# Patient Record
Sex: Female | Born: 1955 | Race: White | Hispanic: No | Marital: Single | Smoking: Never smoker
Health system: Southern US, Community
[De-identification: ages and names within clinical notes are randomized; demographics above are authoritative.]

## PROBLEM LIST (undated history)

## (undated) DIAGNOSIS — Z9889 Other specified postprocedural states: Secondary | ICD-10-CM

## (undated) DIAGNOSIS — D649 Anemia, unspecified: Secondary | ICD-10-CM

## (undated) DIAGNOSIS — E785 Hyperlipidemia, unspecified: Secondary | ICD-10-CM

## (undated) DIAGNOSIS — J189 Pneumonia, unspecified organism: Secondary | ICD-10-CM

## (undated) DIAGNOSIS — H269 Unspecified cataract: Secondary | ICD-10-CM

## (undated) DIAGNOSIS — I341 Nonrheumatic mitral (valve) prolapse: Secondary | ICD-10-CM

## (undated) DIAGNOSIS — I2510 Atherosclerotic heart disease of native coronary artery without angina pectoris: Secondary | ICD-10-CM

## (undated) DIAGNOSIS — R112 Nausea with vomiting, unspecified: Secondary | ICD-10-CM

## (undated) DIAGNOSIS — I10 Essential (primary) hypertension: Secondary | ICD-10-CM

## (undated) DIAGNOSIS — T7840XA Allergy, unspecified, initial encounter: Secondary | ICD-10-CM

## (undated) DIAGNOSIS — M797 Fibromyalgia: Secondary | ICD-10-CM

## (undated) DIAGNOSIS — C50919 Malignant neoplasm of unspecified site of unspecified female breast: Secondary | ICD-10-CM

## (undated) DIAGNOSIS — Z8489 Family history of other specified conditions: Secondary | ICD-10-CM

## (undated) HISTORY — PX: OTHER SURGICAL HISTORY: SHX169

## (undated) HISTORY — DX: Allergy, unspecified, initial encounter: T78.40XA

## (undated) HISTORY — DX: Unspecified cataract: H26.9

## (undated) HISTORY — PX: JOINT REPLACEMENT: SHX530

## (undated) HISTORY — PX: FOOT BONE EXCISION: SUR493

## (undated) HISTORY — PX: SPINE SURGERY: SHX786

## (undated) HISTORY — PX: BREAST SURGERY: SHX581

## (undated) HISTORY — PX: UPPER GI ENDOSCOPY: SHX6162

---
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
## 2000-08-21 HISTORY — PX: ABDOMINAL HYSTERECTOMY: SHX81

## 2007-08-22 DIAGNOSIS — C50919 Malignant neoplasm of unspecified site of unspecified female breast: Secondary | ICD-10-CM

## 2007-08-22 HISTORY — DX: Malignant neoplasm of unspecified site of unspecified female breast: C50.919

## 2010-08-21 HISTORY — PX: TONSILLECTOMY: SUR1361

## 2011-08-22 HISTORY — PX: SHOULDER ARTHROTOMY: SUR111

## 2015-08-24 DIAGNOSIS — Z96612 Presence of left artificial shoulder joint: Secondary | ICD-10-CM | POA: Insufficient documentation

## 2018-01-08 ENCOUNTER — Encounter: Payer: Self-pay | Admitting: Family Medicine

## 2018-02-09 ENCOUNTER — Encounter: Payer: Self-pay | Admitting: Family Medicine

## 2018-03-11 LAB — HM DEXA SCAN

## 2018-05-30 ENCOUNTER — Encounter: Payer: Self-pay | Admitting: Family Medicine

## 2019-07-07 LAB — BASIC METABOLIC PANEL
BUN: 29 — AB (ref 4–21)
CO2: 30 — AB (ref 13–22)
Creatinine: 0.7 (ref 0.5–1.1)
Glucose: 89
Potassium: 4.7 (ref 3.4–5.3)

## 2019-07-07 LAB — CBC AND DIFFERENTIAL
HCT: 34 — AB (ref 36–46)
Hemoglobin: 11.5 — AB (ref 12.0–16.0)
Platelets: 256 (ref 150–399)
WBC: 4.3

## 2019-07-07 LAB — LIPID PANEL
Cholesterol: 208 — AB (ref 0–200)
HDL: 61 (ref 35–70)
LDL Cholesterol: 117
Triglycerides: 150 (ref 40–160)

## 2019-07-07 LAB — CBC: RBC: 3.81 — AB (ref 3.87–5.11)

## 2019-07-07 LAB — COMPREHENSIVE METABOLIC PANEL: Albumin: 4.2 (ref 3.5–5.0)

## 2019-07-07 LAB — HEPATIC FUNCTION PANEL
ALT: 22 (ref 7–35)
AST: 22 (ref 13–35)

## 2019-08-22 HISTORY — PX: FOOT BONE EXCISION: SUR493

## 2019-10-02 ENCOUNTER — Encounter (HOSPITAL_COMMUNITY): Payer: Self-pay

## 2019-10-02 ENCOUNTER — Other Ambulatory Visit: Payer: Self-pay

## 2019-10-02 ENCOUNTER — Ambulatory Visit (HOSPITAL_COMMUNITY)
Admission: EM | Admit: 2019-10-02 | Discharge: 2019-10-02 | Disposition: A | Discharge status: Home / Self Care | Payer: Medicare Other | Attending: Family Medicine | Admitting: Family Medicine

## 2019-10-02 DIAGNOSIS — K219 Gastro-esophageal reflux disease without esophagitis: Secondary | ICD-10-CM

## 2019-10-02 HISTORY — DX: Hyperlipidemia, unspecified: E78.5

## 2019-10-02 MED ORDER — LIDOCAINE VISCOUS HCL 2 % MT SOLN
15.0000 mL | Freq: Once | OROMUCOSAL | Status: AC
  Administered 2019-10-02: 15 mL via ORAL

## 2019-10-02 MED ORDER — LIDOCAINE VISCOUS HCL 2 % MT SOLN
OROMUCOSAL | Status: AC
  Filled 2019-10-02: qty 15

## 2019-10-02 MED ORDER — BENZONATATE 100 MG PO CAPS: 100.0000 mg | ORAL_CAPSULE | Freq: Three times a day (TID) | ORAL | 0 refills | Status: DC

## 2019-10-02 MED ORDER — ALUM & MAG HYDROXIDE-SIMETH 200-200-20 MG/5ML PO SUSP
30.0000 mL | Freq: Once | ORAL | Status: AC
  Administered 2019-10-02: 10:00:00 30 mL via ORAL

## 2019-10-02 MED ORDER — ALUM & MAG HYDROXIDE-SIMETH 200-200-20 MG/5ML PO SUSP
ORAL | Status: AC
  Filled 2019-10-02: qty 30

## 2019-10-02 MED ORDER — OMEPRAZOLE 20 MG PO CPDR: 20.0000 mg | DELAYED_RELEASE_CAPSULE | Freq: Two times a day (BID) | ORAL | 1 refills | Status: DC

## 2019-10-02 MED ORDER — ALUMINUM & MAGNESIUM HYDROXIDE 200-200 MG/5ML PO SUSP: 10.0000 mL | Freq: Three times a day (TID) | ORAL | 0 refills | Status: DC

## 2019-10-02 NOTE — Discharge Instructions (Addendum)
Take the Prilosec 2 times a day before meals.  Preferably in the morning and evening.  You can use the Maalox as needed up to 3 times a day for acute flares of the acid reflux. Try to reduce the spicy, greasy, processed foods because this could flareup symptoms.  Chocolate and caffeine could also cause symptoms. Tessalon Perles for cough. Contact put on your discharge directions for primary care if needed

## 2019-10-02 NOTE — ED Triage Notes (Addendum)
Pt c/o heartburn, indigestion, belching intermittently for several months. She took OTC prilosec for several weeks with improvement and sx returned after stopping OTC. States had increase belching and GI symptoms after eating tacos two nights ago. Restarted prilosec OTC two weeks, but sx persist. Also c/o dysphagia x3 months, "feels like food doesn't want to go down". Had vomiting two nights ago.  Denies CP, SOB, diaphoresis, dizziness. Request refill of Tessalon pearls

## 2019-10-05 NOTE — ED Provider Notes (Addendum)
[facility]    CSN: [id number] Arrival date & time: 10/02/19  [id number]      History   Chief Complaint Chief Complaint  Patient presents with  . Heartburn    HPI Shannon Osborne is Osborne 64 y.o. female.   Patient is Osborne 64 year old female that presents today with heartburn, indigestion and belching that has been intermittent, waxing waning over the past month or so.  She has been taking over-the-counter Prilosec 20 mg daily for the last several weeks with some improvement but symptoms returned after stopping the medication.  She ate 2 tacos last night and had increased symptoms so she restarted the Prilosec.  Feels the sensation of something stuck in throat and esophagus.  Vomited after eating 2 days ago.  Reporting recent increase in stress with selling house and moving into [facility] new house.  Reports her diet has not been as healthy as usual due to not been able to cook food at home.  Denies any chest pain, shortness of breath, diaphoresis, dizziness.  Has also had intermittent cough. No abdominal pain.   ROS per HPI      Past Medical History:  Diagnosis Date  . Breast CA ([place])   . Hyperlipemia     There are no problems to display for this patient.   Past Surgical History:  Procedure Laterality Date  . ABDOMINAL HYSTERECTOMY    . BREAST SURGERY    . FOOT BONE EXCISION    . SHOULDER ARTHROTOMY    . TONSILLECTOMY      OB History   No obstetric history on file.      Home Medications    Prior to Admission medications   Medication Sig Start Date End Date Taking? Authorizing Provider  carisoprodol (SOMA) 350 MG tablet Take 350 mg by mouth 3 (three) times daily.   Yes [provider]  diclofenac (VOLTAREN) 75 MG EC tablet Take 75 mg by mouth 2 (two) times daily.   Yes [provider]  ezetimibe (ZETIA) 10 MG tablet Take 10 mg by mouth daily.   Yes [provider]  Fluticasone Propionate 0.05 % LOTN Apply topically.   Yes [provider]   Melatonin 10 MG CAPS Take by mouth.   Yes [provider]  Pitavastatin Calcium (LIVALO) 4 MG TABS Take by mouth.   Yes [provider]  aluminum-magnesium hydroxide 200-200 MG/5ML suspension Take 10 mLs by mouth 3 (three) times daily. 10/02/19   Shannon Halt A, NP  benzonatate (TESSALON) 100 MG capsule Take 1 capsule (100 mg total) by mouth every 8 (eight) hours. Take 1-2 capsules every 8 hours as needed 10/02/19   Shannon Halt A, NP  omeprazole (PRILOSEC) 20 MG capsule Take 1 capsule (20 mg total) by mouth 2 (two) times daily before [place] meal. 10/02/19 11/01/19  Orvan July, NP    Family History Family History  Problem Relation Age of Onset  . Hypertension Mother   . Stroke Father     Social History Social History   Tobacco Use  . Smoking status: Never Smoker  . Smokeless tobacco: Never Used  Substance Use Topics  . Alcohol use: Yes  . Drug use: Never     Allergies   Atorvastatin, Baclofen, Cyclobenzaprine, Escitalopram oxalate, Prednisone, Rosuvastatin, Sertraline, Simvastatin, Codeine, Contrast media  [iodinated diagnostic agents], Hydrocodone-acetaminophen, and Oxycodone hcl   Review of Systems Review of Systems   Physical Exam Triage Vital Signs ED Triage Vitals  Enc Vitals Group  BP 10/02/19 0849 (!) 151/74     Pulse Rate 10/02/19 0849 79     Resp 10/02/19 0849 20     Temp 10/02/19 0849 98.2 F (36.8 C)     Temp Source 10/02/19 0849 Oral     SpO2 10/02/19 0849 100 %     Weight --      Height --      Head Circumference --      Peak Flow --      Pain Score 10/02/19 0839 2     Pain Loc --      Pain Edu? --      Excl. in [place]? --    No data found.  Updated Vital Signs BP (!) 151/74 (BP Location: Left Arm)   Pulse 79   Temp 98.2 F (36.8 C) (Oral)   Resp 20   SpO2 100%   Visual Acuity Right Eye Distance:   Left Eye Distance:   Bilateral Distance:    Right Eye Near:   Left Eye Near:    Bilateral Near:     Physical Exam Vitals and  nursing note reviewed.  Constitutional:      General: She is not in acute distress.    Appearance: She is well-developed.  HENT:     Head: Normocephalic and atraumatic.  Eyes:     Conjunctiva/sclera: Conjunctivae normal.  Cardiovascular:     Rate and Rhythm: Normal rate and regular rhythm.     Heart sounds: No murmur.  Pulmonary:     Effort: Pulmonary effort is normal. No respiratory distress.     Breath sounds: Normal breath sounds.  Abdominal:     Palpations: Abdomen is soft.     Tenderness: There is no abdominal tenderness.  Musculoskeletal:     Cervical back: Neck supple.  Skin:    General: Skin is warm and dry.  Neurological:     Mental Status: She is alert.      UC Treatments / Results  Labs (all labs ordered are listed, but only abnormal results are displayed) Labs Reviewed - No data to display  EKG   Radiology No results found.  Procedures Procedures (including critical care time)  Medications Ordered in UC Medications  alum & mag hydroxide-simeth (MAALOX/MYLANTA) 200-200-20 MG/5ML suspension 30 mL (30 mLs Oral Given 10/02/19 0930)    And  lidocaine (XYLOCAINE) 2 % viscous mouth solution 15 mL (15 mLs Oral Given 10/02/19 0930)    Initial Impression / Assessment and Plan / UC Course  I have reviewed the triage vital signs and the nursing notes.  Pertinent labs & imaging results that were available during my care of the patient were reviewed by me and considered in my medical decision making (see chart for details).     GERD-most likely diagnosis based on symptoms and history. We will have her increase the Prilosec to 2 times [place] day before meals Maalox as needed GI cocktail given here with some relief. Diet instructions and precautions given Refill Tessalon Perles for cough as requested. EKG with normal sinus rhythm and possible left atrial enlargement.  Nothing acute and no concerns for ACS at this time. Vital signs stable and she is nontoxic or  ill-appearing. Contact given for primary care follow-up Final Clinical Impressions(s) / UC Diagnoses   Final diagnoses:  Gastroesophageal reflux disease without esophagitis     Discharge Instructions     Take the Prilosec 2 times [place] day before meals.  Preferably in the morning and evening.  You can use the Maalox as needed up to 3 times [place] day for acute flares of the acid reflux. Try to reduce the spicy, greasy, processed foods because this could flareup symptoms.  Chocolate and caffeine could also cause symptoms. Tessalon Perles for cough. Contact put on your discharge directions for primary care if needed    ED Prescriptions    Medication Sig Dispense Auth. Provider   omeprazole (PRILOSEC) 20 MG capsule Take 1 capsule (20 mg total) by mouth 2 (two) times daily before [place] meal. 30 capsule Shannon Happ A, NP   aluminum-magnesium hydroxide 200-200 MG/5ML suspension Take 10 mLs by mouth 3 (three) times daily. 355 mL Shannon Hannen A, NP   benzonatate (TESSALON) 100 MG capsule Take 1 capsule (100 mg total) by mouth every 8 (eight) hours. Take 1-2 capsules every 8 hours as needed 21 capsule Shannon Cisek A, NP     PDMP not reviewed this encounter.   Orvan July, NP 10/05/19 1130    Shannon Halt A, NP 10/05/19 1131

## 2019-10-22 LAB — HM COLONOSCOPY

## 2019-11-03 ENCOUNTER — Other Ambulatory Visit: Payer: Self-pay | Admitting: Student

## 2019-11-03 DIAGNOSIS — M79671 Pain in right foot: Secondary | ICD-10-CM

## 2019-11-10 ENCOUNTER — Ambulatory Visit
Admission: RE | Admit: 2019-11-10 | Discharge: 2019-11-10 | Disposition: A | Discharge status: Home / Self Care | Payer: Medicare Other | Source: Ambulatory Visit | Attending: Student | Admitting: Student

## 2019-11-10 ENCOUNTER — Other Ambulatory Visit: Payer: Self-pay

## 2019-11-10 DIAGNOSIS — M79671 Pain in right foot: Secondary | ICD-10-CM

## 2020-01-16 DIAGNOSIS — M19171 Post-traumatic osteoarthritis, right ankle and foot: Secondary | ICD-10-CM | POA: Insufficient documentation

## 2020-01-16 DIAGNOSIS — M960 Pseudarthrosis after fusion or arthrodesis: Secondary | ICD-10-CM | POA: Insufficient documentation

## 2020-02-18 ENCOUNTER — Ambulatory Visit (INDEPENDENT_AMBULATORY_CARE_PROVIDER_SITE_OTHER): Payer: Medicare Other | Admitting: Family Medicine

## 2020-02-18 ENCOUNTER — Encounter: Payer: Self-pay | Admitting: Family Medicine

## 2020-02-18 ENCOUNTER — Other Ambulatory Visit: Payer: Self-pay

## 2020-02-18 VITALS — BP 132/74 | HR 75 | Temp 98.2°F | Ht 60.0 in | Wt 134.8 lb

## 2020-02-18 DIAGNOSIS — Z853 Personal history of malignant neoplasm of breast: Secondary | ICD-10-CM | POA: Diagnosis not present

## 2020-02-18 DIAGNOSIS — M47816 Spondylosis without myelopathy or radiculopathy, lumbar region: Secondary | ICD-10-CM | POA: Diagnosis not present

## 2020-02-18 DIAGNOSIS — E519 Thiamine deficiency, unspecified: Secondary | ICD-10-CM

## 2020-02-18 DIAGNOSIS — G5701 Lesion of sciatic nerve, right lower limb: Secondary | ICD-10-CM

## 2020-02-18 DIAGNOSIS — G8929 Other chronic pain: Secondary | ICD-10-CM

## 2020-02-18 DIAGNOSIS — E782 Mixed hyperlipidemia: Secondary | ICD-10-CM | POA: Diagnosis not present

## 2020-02-18 DIAGNOSIS — R2689 Other abnormalities of gait and mobility: Secondary | ICD-10-CM

## 2020-02-18 DIAGNOSIS — Z1501 Genetic susceptibility to malignant neoplasm of breast: Secondary | ICD-10-CM | POA: Diagnosis not present

## 2020-02-18 DIAGNOSIS — M545 Low back pain, unspecified: Secondary | ICD-10-CM | POA: Insufficient documentation

## 2020-02-18 DIAGNOSIS — Z1509 Genetic susceptibility to other malignant neoplasm: Secondary | ICD-10-CM

## 2020-02-18 DIAGNOSIS — Z9189 Other specified personal risk factors, not elsewhere classified: Secondary | ICD-10-CM

## 2020-02-18 DIAGNOSIS — Z9013 Acquired absence of bilateral breasts and nipples: Secondary | ICD-10-CM | POA: Insufficient documentation

## 2020-02-18 DIAGNOSIS — Z889 Allergy status to unspecified drugs, medicaments and biological substances status: Secondary | ICD-10-CM

## 2020-02-18 DIAGNOSIS — K635 Polyp of colon: Secondary | ICD-10-CM | POA: Insufficient documentation

## 2020-02-18 DIAGNOSIS — D649 Anemia, unspecified: Secondary | ICD-10-CM | POA: Diagnosis not present

## 2020-02-18 DIAGNOSIS — Z1159 Encounter for screening for other viral diseases: Secondary | ICD-10-CM

## 2020-02-18 DIAGNOSIS — T466X5A Adverse effect of antihyperlipidemic and antiarteriosclerotic drugs, initial encounter: Secondary | ICD-10-CM | POA: Insufficient documentation

## 2020-02-18 DIAGNOSIS — M791 Myalgia, unspecified site: Secondary | ICD-10-CM

## 2020-02-18 DIAGNOSIS — K219 Gastro-esophageal reflux disease without esophagitis: Secondary | ICD-10-CM | POA: Insufficient documentation

## 2020-02-18 HISTORY — DX: Gastro-esophageal reflux disease without esophagitis: K21.9

## 2020-02-18 HISTORY — DX: Other chronic pain: G89.29

## 2020-02-18 HISTORY — DX: Spondylosis without myelopathy or radiculopathy, lumbar region: M47.816

## 2020-02-18 HISTORY — DX: Allergy status to unspecified drugs, medicaments and biological substances: Z88.9

## 2020-02-18 HISTORY — DX: Genetic susceptibility to malignant neoplasm of breast: Z15.01

## 2020-02-18 HISTORY — DX: Low back pain, unspecified: M54.50

## 2020-02-18 HISTORY — DX: Myalgia, unspecified site: M79.10

## 2020-02-18 HISTORY — DX: Adverse effect of antihyperlipidemic and antiarteriosclerotic drugs, initial encounter: T46.6X5A

## 2020-02-18 HISTORY — DX: Genetic susceptibility to other malignant neoplasm: Z15.09

## 2020-02-18 LAB — CBC WITH DIFFERENTIAL/PLATELET
Basophils Absolute: 0 10*3/uL (ref 0.0–0.1)
Basophils Relative: 0.3 % (ref 0.0–3.0)
Eosinophils Absolute: 0 10*3/uL (ref 0.0–0.7)
Eosinophils Relative: 0.7 % (ref 0.0–5.0)
HCT: 36.9 % (ref 36.0–46.0)
Hemoglobin: 12.4 g/dL (ref 12.0–15.0)
Lymphocytes Relative: 32.5 % (ref 12.0–46.0)
Lymphs Abs: 1.7 10*3/uL (ref 0.7–4.0)
MCHC: 33.7 g/dL (ref 30.0–36.0)
MCV: 88 fl (ref 78.0–100.0)
Monocytes Absolute: 0.4 10*3/uL (ref 0.1–1.0)
Monocytes Relative: 8.1 % (ref 3.0–12.0)
Neutro Abs: 3 10*3/uL (ref 1.4–7.7)
Neutrophils Relative %: 58.4 % (ref 43.0–77.0)
Platelets: 235 10*3/uL (ref 150.0–400.0)
RBC: 4.19 Mil/uL (ref 3.87–5.11)
RDW: 12.5 % (ref 11.5–15.5)
WBC: 5.2 10*3/uL (ref 4.0–10.5)

## 2020-02-18 MED ORDER — OMEPRAZOLE 20 MG PO CPDR: 20.0000 mg | DELAYED_RELEASE_CAPSULE | Freq: Two times a day (BID) | ORAL | 1 refills | Status: DC

## 2020-02-18 MED ORDER — SHINGRIX 50 MCG/0.5ML IM SUSR: 0.5000 mL | Freq: Once | INTRAMUSCULAR | 0 refills | Status: AC

## 2020-02-18 NOTE — Patient Instructions (Addendum)
Please return in 6 months for your annual complete physical; please come fasting.   It was a pleasure seeing you today! Thank you for choosing [place] to meet your healthcare needs! I truly look forward to working with you. If you have any questions or concerns, please send me a message via Mychart or call the office at [phone].  Please sign up for Mychart. I have texted you the link. I will release your lab results to you on your MyChart account with further instructions. Please reply with any questions.   I have referred your to orthopedics and we will call you to get that appointment scheduled.  Let's increase your omeprazole to twice a day for 6-8 weeks minimum to see if your reflux symptoms improve. Let me know if they don't.  I've printed the shingrix prescription. Please take the prescription for Shingrix to the pharmacy so they may administer the vaccinations. Your insurance will then cover the injections.

## 2020-02-18 NOTE — Progress Notes (Signed)
Subjective  CC:  Chief Complaint  Patient presents with  . Establish Care  . Pain    needs referral for pain management injections in the past   . Gastroesophageal Reflux    has had increased reflux that started in Jan. Taking omeprazol daily.     HPI: Shannon Osborne is a 64 y.o. female who presents to [facility] today to establish care with me as a new patient.   She has the following concerns or needs:  64 year [place].  Overall active however suffers from osteoarthritis, chronic low back pain, and is dealing with a nonunion foot injury.  I reviewed her recent orthopedic evaluation from [facility] who is planning on operative repair.  She is hopeful that we will work.  She also requests orthopedic or pain management for chronic low back pain including osteoarthritis with bulging disc, I reviewed the recent MRI report and history of piriformis syndrome.  She has tightness in the lower back and pain with swelling responsive to ice.  She has had what sounds like fluoroscopic guided injection in the past.  She is hopeful to get another.  No new symptoms.  GERD: Symptoms recently more active over the last several months.  May have been triggered by having to eat out more often.  Prior to this year she was using Tums as needed.  More recently she was using omeprazole 20 mg daily which has been helpful but still has to monitor what she eats and gets symptoms with acidic foods.  No hematemesis or melena.  She does have a history of chronic anemia for which she says was evaluated but because was never found.  It was not responsive to iron or B12 per her report.  Multiple drug and food allergies: Reviewed her allergy test results.  Allergic cough diagnosed as well.  Possible B1 deficiency would like this rechecked  Strong family history of breast cancer with breast cancer in 2010 and positive BRCA1 gene  mutation: Status post bilateral mastectomy.  She did have chemotherapy treatment as well.  Reports has had complications from this.  Hyperlipidemia on a statin that she now tolerates.  Has multiple myalgias with other statins.  History of colon polyps and reports most recent colonoscopy this year was normal.  She gets surveillance every 5 years.  No mood disorders or history of anxiety.  She admits she is a high strung person.  Rare Ativan use.  Assessment  1. Gastroesophageal reflux disease without esophagitis   2. History of breast cancer   3. Spondylosis of lumbar region without myelopathy or radiculopathy   4. BRCA1 gene mutation positive   5. Myalgia due to statin   6. Mixed hyperlipidemia   7. Piriformis syndrome, right   8. Anemia, unspecified type   9. 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 Hyperplastic colonic polyp, unspecified part of colon   12. Encounter for hepatitis C virus screening test for high risk patient      Plan   Most chronic problems are controlled.  Active concerns include GERD: Increase omeprazole to 20 mg twice daily for the next 6 to 12 weeks.  Follow-up if not improved  Back pain: Refer to orthopedics [facility] Gather and can refer to Dr. Caro Laroche if needed  Follow-up on hyperlipidemia.  Follow-up on anemia restarted.   Follow up:  Return in  about 6 months (around 08/19/2020) for complete physical. Orders Placed This Encounter  Procedures  . HM DEXA SCAN  . CBC with Differential/Platelet  . Comprehensive metabolic panel  . Lipid panel  . TSH  . B12 and Folate Panel  . Iron, TIBC and Ferritin Panel  . Vitamin B1  . Hepatitis C antibody  . CBC and differential  . CBC  . Basic metabolic panel  . Comprehensive metabolic panel  . Lipid panel  . Hepatic function panel  . Ambulatory referral to Orthopedic Surgery   Meds ordered this encounter  Medications  . omeprazole (PRILOSEC) 20 MG capsule    Sig: Take  1 capsule (20 mg total) by mouth 2 (two) times daily before a meal.    Dispense:  180 capsule    Refill:  1  . Zoster Vaccine Adjuvanted [facility]) injection    Sig: Inject 0.5 mLs into the muscle once for 1 dose. Please give 2nd dose 2-6 months after first dose    Dispense:  2 each    Refill:  0     Depression screen [facility] 2/9 02/18/2020  Decreased Interest 0  Down, Depressed, Hopeless 0  PHQ - 2 Score 0  Altered sleeping 3  Tired, decreased energy 0  Change in appetite 0  Feeling bad or failure about yourself  0  Trouble concentrating 0  Moving slowly or fidgety/restless 0  Suicidal thoughts 0  PHQ-9 Score 3  Difficult doing work/chores Not difficult at all    We updated and reviewed the patient's past history in detail and it is documented below.  Patient Active Problem List   Diagnosis Date Noted  . History of breast cancer 02/18/2020    Priority: High  . 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    Priority: High  . Myalgia due to statin 02/18/2020    Priority: High  . Mixed hyperlipidemia 02/18/2020    Priority: High  . Multiple drug allergies 02/18/2020    Priority: High  . 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    Priority: Medium    MRI 05/2018   . GERD (gastroesophageal reflux disease) 02/18/2020    Priority: Medium  . Colon polyp 02/18/2020    Priority: Medium    15 years ago; 2 normals afterwards. Most recent 2021   . 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 Post-traumatic arthritis of right foot 01/16/2020    Priority: Medium  . Status post bilateral mastectomy 02/18/2020    Priority: Low  . Status post shoulder replacement, 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    Priority: Low  . Chronic midline low back pain without sciatica 02/18/2020   Health Maintenance  Topic Date Due  . Hepatitis C Screening  Never done  . TETANUS/TDAP  Never done  . PNA vac Low Risk Adult (1 of 2 - PCV13) Never done  . INFLUENZA VACCINE  03/21/2020  . COLONOSCOPY   10/21/2029  . DEXA SCAN  Completed  . COVID-19 Vaccine  Completed   Immunization History  Administered Date(s) Administered  . Influenza Whole 05/22/2011  . Influenza-Unspecified 06/26/2015  . PFIZER SARS-COV-2 Vaccination 10/03/2019, 10/28/2019   Current Meds  Medication Sig  . carisoprodol (SOMA) 350 MG tablet Take 350 mg by mouth 3 (three) times daily.  . diclofenac Sodium (VOLTAREN) 1 % GEL Apply topically 4 (four) times daily.  . ergocalciferol (VITAMIN D2) 1.25 MG (50000 UT) capsule   . ezetimibe (ZETIA) 10 MG tablet Take 10 mg by mouth daily.  Marland Kitchen  Fluticasone Propionate 0.05 % LOTN Apply topically.  Marland Kitchen LORazepam (ATIVAN) 0.5 MG tablet as needed.  . Melatonin 10 MG CAPS Take by mouth.  Marland Kitchen omeprazole (PRILOSEC) 20 MG capsule Take 1 capsule (20 mg total) by mouth 2 (two) times daily before a meal.  . Pitavastatin Calcium (LIVALO) 4 MG TABS Take by mouth.  . Polyethylene Glycol 3350 (MIRALAX PO) Take by mouth.  . Probiotic Product (PROBIOTIC-10 PO) Take by mouth.  . [DISCONTINUED] omeprazole (PRILOSEC) 20 MG capsule Take 1 capsule (20 mg total) by mouth 2 (two) times daily before a meal.    Allergies: Patient is allergic to baclofen, cyclobenzaprine, escitalopram oxalate, iodine, rosuvastatin, sertraline, simvastatin, atorvastatin, codeine, contrast media [iodinated diagnostic agents], hydrochlorothiazide w-triamterene, hydrocodone-acetaminophen, and oxycodone hcl. Past Medical History Patient  has a past medical history of BRCA1 gene mutation positive (02/18/2020), Breast CA (HCC), Breast cancer (HCC) (2009), Chronic midline low back pain without sciatica (02/18/2020), GERD (gastroesophageal reflux disease) (02/18/2020), Hyperlipemia, Multiple drug allergies (02/18/2020), Myalgia due to statin (02/18/2020), and Osteoarthritis of lumbar spine (02/18/2020). Past Surgical History Patient  has a past surgical history that includes Breast surgery; Abdominal hysterectomy (2002); Tonsillectomy  (2012); Shoulder arthrotomy (Left, 2013); and Foot bone excision. Family History: Patient family history includes Colon polyps in her mother; Gallstones in her brother, brother, brother, and mother; Hypertension in her brother, brother, brother, and mother; Stroke in her father. Social History:  Patient  reports that she has never smoked. She has never used smokeless tobacco. She reports current alcohol use. She reports that she does not use drugs.  Review of Systems: Constitutional: negative for fever or malaise Ophthalmic: negative for photophobia, double vision or loss of vision Cardiovascular: negative for chest pain, dyspnea on exertion, or new LE swelling Respiratory: negative for SOB or persistent cough Gastrointestinal: negative for abdominal pain, change in bowel habits or melena Genitourinary: negative for dysuria or gross hematuria Musculoskeletal: negative for new gait disturbance or muscular weakness Integumentary: negative for new or persistent rashes Neurological: negative for TIA or stroke symptoms Psychiatric: negative for SI or delusions Allergic/Immunologic: negative for hives  Patient Care Team    Relationship Specialty Notifications Start End  Willow Ora, MD PCP - General Family Medicine  02/18/20   Harmon Dun, MD Referring Physician Orthopedic Surgery  02/18/20     Objective  Vitals: BP 132/74   Pulse 75   Temp 98.2 F (36.8 C) (Temporal)   Ht 5' (1.524 m)   Wt 134 lb 12.8 oz (61.1 kg)   SpO2 99%   BMI 26.33 kg/m  General:  Well developed, well nourished, no acute distress , stiff getting to the exam table Psych:  Alert and oriented,normal mood and affect HEENT:  Normocephalic, atraumatic, non-icteric sclera, supple neck without adenopathy, mass or thyromegaly Cardiovascular:  RRR without gallop, rub or murmur Respiratory:  Good breath sounds bilaterally, CTAB with normal respiratory effort Gastrointestinal: normal bowel sounds, soft, non-tender, no  noted masses. No HSM   Commons side effects, risks, benefits, and alternatives for medications and treatment plan prescribed today were discussed, and the patient expressed understanding of the given instructions. Patient is instructed to call or message via MyChart if he/she has any questions or concerns regarding our treatment plan. No barriers to understanding were identified. We discussed Red Flag symptoms and signs in detail. Patient expressed understanding regarding what to do in case of urgent or emergency type symptoms.   Medication list was reconciled, printed and provided to the patient in AVS.  Patient instructions and summary information was reviewed with the patient as documented in the AVS. This note was prepared with assistance of Dragon voice recognition software. Occasional wrong-word or sound-a-like substitutions may have occurred due to the inherent limitations of voice recognition software  This visit occurred during the SARS-CoV-2 public health emergency.  Safety protocols were in place, including screening questions prior to the visit, additional usage of staff PPE, and extensive cleaning of exam room while observing appropriate contact time as indicated for disinfecting solutions.

## 2020-02-19 LAB — COMPREHENSIVE METABOLIC PANEL
ALT: 16 U/L (ref 0–35)
AST: 21 U/L (ref 0–37)
Albumin: 4.8 g/dL (ref 3.5–5.2)
Alkaline Phosphatase: 74 U/L (ref 39–117)
BUN: 21 mg/dL (ref 6–23)
CO2: 27 mEq/L (ref 19–32)
Calcium: 9.7 mg/dL (ref 8.4–10.5)
Chloride: 102 mEq/L (ref 96–112)
Creatinine, Ser: 0.68 mg/dL (ref 0.40–1.20)
GFR: 86.53 mL/min (ref >60.00)
Glucose, Bld: 85 mg/dL (ref 70–99)
Potassium: 4.1 mEq/L (ref 3.5–5.1)
Sodium: 138 mEq/L (ref 135–145)
Total Bilirubin: 0.7 mg/dL (ref 0.2–1.2)
Total Protein: 6.9 g/dL (ref 6.0–8.3)

## 2020-02-19 LAB — TSH: TSH: 1.61 u[IU]/mL (ref 0.35–4.50)

## 2020-02-19 LAB — LIPID PANEL
Cholesterol: 170 mg/dL (ref 0–200)
HDL: 49.9 mg/dL (ref >39.00)
NonHDL: 120.56
Total CHOL/HDL Ratio: 3
Triglycerides: 231 mg/dL — ABNORMAL HIGH (ref 0.0–149.0)
VLDL: 46.2 mg/dL — ABNORMAL HIGH (ref 0.0–40.0)

## 2020-02-19 LAB — LDL CHOLESTEROL, DIRECT: Direct LDL: 85 mg/dL

## 2020-02-19 LAB — B12 AND FOLATE PANEL
Folate: 24.4 ng/mL (ref >5.9)
Vitamin B-12: 324 pg/mL (ref 211–911)

## 2020-02-21 ENCOUNTER — Encounter: Payer: Self-pay | Admitting: Family Medicine

## 2020-02-23 LAB — HEPATITIS C ANTIBODY
Hepatitis C Ab: NONREACTIVE
SIGNAL TO CUT-OFF: 0 (ref <1.00)

## 2020-02-23 LAB — IRON,TIBC AND FERRITIN PANEL
%SAT: 27 % (calc) (ref 16–45)
Ferritin: 66 ng/mL (ref 16–288)
Iron: 108 ug/dL (ref 45–160)
TIBC: 396 mcg/dL (calc) (ref 250–450)

## 2020-02-23 LAB — VITAMIN B1: Vitamin B1 (Thiamine): 23 nmol/L (ref 8–30)

## 2020-02-26 ENCOUNTER — Encounter: Payer: Self-pay | Admitting: Family Medicine

## 2020-04-12 ENCOUNTER — Encounter: Payer: Self-pay | Admitting: Family Medicine

## 2020-04-12 ENCOUNTER — Other Ambulatory Visit: Payer: Self-pay

## 2020-04-12 ENCOUNTER — Encounter: Payer: Self-pay | Admitting: Physician Assistant

## 2020-04-12 ENCOUNTER — Telehealth (INDEPENDENT_AMBULATORY_CARE_PROVIDER_SITE_OTHER): Payer: Medicare Other | Admitting: Physician Assistant

## 2020-04-12 VITALS — Ht 60.0 in | Wt 135.0 lb

## 2020-04-12 DIAGNOSIS — H579 Unspecified disorder of eye and adnexa: Secondary | ICD-10-CM | POA: Diagnosis not present

## 2020-04-12 DIAGNOSIS — B028 Zoster with other complications: Secondary | ICD-10-CM

## 2020-04-12 MED ORDER — VALACYCLOVIR HCL 1 G PO TABS: 1000.0000 mg | ORAL_TABLET | Freq: Three times a day (TID) | ORAL | 0 refills | Status: AC

## 2020-04-12 NOTE — Progress Notes (Signed)
Virtual Visit via Video   I connected with Shannon Osborne on 04/12/20 at  4:00 PM EDT by a video enabled telemedicine application and verified that I am speaking with the correct person using two identifiers. Location patient: Home Location provider: [facility], Office Persons participating in the virtual visit: [place], Klutts PA-C, Anselmo Pickler, LPN   I discussed the limitations of evaluation and management by telemedicine and the availability of in person appointments. The patient expressed understanding and agreed to proceed.  I acted as a Education administrator for [employer], [employer], LPN   Subjective:   HPI:   Shingles Pt had her first Shingrix shot on 04/05/2020. She started having symptoms that evening including body aches. On 8/19 she developed a fever blister on lower lip, then on 04/11/20 around noon she noticed that her left ear and ear canal, left eye, forehead, left side of face and neck, left inside of throat, the roof of mouth all were stinging.   She thinks that she may be having symptoms on her R side of her face but she is unsure.  She is mostly concerned because her L eye has significant stinging/tingling. Feels like it is "in" her eye. Symptoms with her eye are worsening with time. There are no visual changes. She wears glasses regularly and doesn't have a current eye doctor.  She has had shingles in the past -- around 10 years ago on the L side of her face.  ROS: See pertinent positives and negatives per HPI.  Patient Active Problem List   Diagnosis Date Noted  . History of breast cancer 02/18/2020  . 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  . 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  . GERD (gastroesophageal reflux disease) 02/18/2020  . Myalgia due to statin 02/18/2020  . Mixed hyperlipidemia 02/18/2020  . Colon polyp 02/18/2020  . Multiple drug allergies 02/18/2020  . Status post bilateral mastectomy 02/18/2020  . Chronic midline  low back pain without sciatica 02/18/2020  . 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  . Post-traumatic arthritis of right foot 01/16/2020  . Status post shoulder replacement, 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    Social History   Tobacco Use  . Smoking status: Never Smoker  . Smokeless tobacco: Never Used  Substance Use Topics  . Alcohol use: Yes    Comment: 10 drinks a year     Current Outpatient Medications:  .  carisoprodol (SOMA) 350 MG tablet, Take 350 mg by mouth 3 (three) times daily., Disp: , Rfl:  .  Coenzyme Q10 10 MG capsule, Take by mouth., Disp: , Rfl:  .  diclofenac Sodium (VOLTAREN) 1 % GEL, Apply topically 4 (four) times daily., Disp: , Rfl:  .  ergocalciferol (VITAMIN D2) 1.25 MG (50000 UT) capsule, , Disp: , Rfl:  .  ezetimibe (ZETIA) 10 MG tablet, Take 10 mg by mouth daily., Disp: , Rfl:  .  Fluticasone Propionate 0.05 % LOTN, Apply topically., Disp: , Rfl:  .  LORazepam (ATIVAN) 0.5 MG tablet, as needed., Disp: , Rfl:  .  Melatonin 10 MG CAPS, Take by mouth., Disp: , Rfl:  .  omeprazole (PRILOSEC) 20 MG capsule, Take 1 capsule (20 mg total) by mouth 2 (two) times daily before a meal., Disp: 180 capsule, Rfl: 1 .  Pitavastatin Calcium (LIVALO) 4 MG TABS, Take by mouth., Disp: , Rfl:  .  Polyethylene Glycol 3350 (MIRALAX PO), Take by mouth., Disp: , Rfl:  .  Probiotic Product (PROBIOTIC-10 PO), Take by mouth., Disp: ,  Rfl:  .  valACYclovir (VALTREX) 1000 MG tablet, Take 1 tablet (1,000 mg total) by mouth 3 (three) times daily for 7 days., Disp: 21 tablet, Rfl: 0  Allergies  Allergen Reactions  . Baclofen Other (See Comments)    insomnia  . Cyclobenzaprine Other (See Comments)    Insomnia  . Escitalopram Oxalate Other (See Comments)  . Iodine Other (See Comments)  . Rosuvastatin Other (See Comments)  . Sertraline Other (See Comments)    "like taking caffeine"  . Simvastatin Other (See Comments)  . Atorvastatin Other (See Comments)    myalgia  . Codeine Rash  . Contrast Media  [Iodinated Diagnostic Agents] Rash    Chest pressure   . Hydrochlorothiazide W-Triamterene Rash  . Hydrocodone-Acetaminophen Rash  . Oxycodone Hcl Rash    Objective:   VITALS: Per patient if applicable, see vitals. GENERAL: Alert, appears well and in no acute distress. HEENT: Atraumatic, conjunctiva clear, no obvious abnormalities on inspection of external nose and ears. NECK: Normal movements of the head and neck. CARDIOPULMONARY: No increased WOB. Speaking in clear sentences. I:E ratio WNL.  MS: Moves all visible extremities without noticeable abnormality. PSYCH: Pleasant and cooperative, well-groomed. Speech normal rate and rhythm. Affect is appropriate. Insight and judgement are appropriate. Attention is focused, linear, and appropriate.  NEURO: CN grossly intact. Oriented as arrived to appointment on time with no prompting. Moves both UE equally.  SKIN: No obvious lesions, wounds, erythema, or cyanosis noted on face or hands.  Assessment and Plan:   [name] was seen today for herpes zoster.  Diagnoses and all orders for this visit:  Herpes zoster with complication Concern for shingles, although she does report possible symptoms on the other side of her face.  Will go ahead and start oral valtrex today. Worsening precautions advised.  Left eye complaint Due to possible shingles involvement in eye and worsening symptoms with time, recommend stat eye referral for evaluation. Patient in agreement to plan.  Other orders -     valACYclovir (VALTREX) 1000 MG tablet; Take 1 tablet (1,000 mg total) by mouth 3 (three) times daily for 7 days.  . Reviewed expectations re: course of current medical issues. . Discussed self-management of symptoms. . Outlined signs and symptoms indicating need for more acute intervention. . Patient verbalized understanding and all questions were answered. Marland Kitchen Health Maintenance issues including appropriate healthy diet, exercise, and smoking avoidance were  discussed with patient. . See orders for this visit as documented in the electronic medical record.  I discussed the assessment and treatment plan with the patient. The patient was provided an opportunity to ask questions and all were answered. The patient agreed with the plan and demonstrated an understanding of the instructions.   The patient was advised to call back or seek an in-person evaluation if the symptoms worsen or if the condition fails to improve as anticipated.   CMA or LPN served as scribe during this visit. History, Physical, and Plan performed by medical provider. The above documentation has been reviewed and is accurate and complete.   [place], [place] 04/12/2020

## 2020-04-28 ENCOUNTER — Other Ambulatory Visit: Payer: Self-pay

## 2020-04-28 ENCOUNTER — Encounter: Payer: Self-pay | Admitting: Family Medicine

## 2020-04-28 ENCOUNTER — Ambulatory Visit (INDEPENDENT_AMBULATORY_CARE_PROVIDER_SITE_OTHER): Payer: Medicare Other | Admitting: Family Medicine

## 2020-04-28 VITALS — BP 124/62 | HR 76 | Temp 98.0°F | Resp 18 | Ht 60.0 in | Wt 137.8 lb

## 2020-04-28 DIAGNOSIS — T50Z95A Adverse effect of other vaccines and biological substances, initial encounter: Secondary | ICD-10-CM

## 2020-04-28 DIAGNOSIS — B0229 Other postherpetic nervous system involvement: Secondary | ICD-10-CM | POA: Diagnosis not present

## 2020-04-28 DIAGNOSIS — Z7189 Other specified counseling: Secondary | ICD-10-CM | POA: Diagnosis not present

## 2020-04-28 DIAGNOSIS — Z23 Encounter for immunization: Secondary | ICD-10-CM

## 2020-04-28 DIAGNOSIS — Z7185 Encounter for immunization safety counseling: Secondary | ICD-10-CM

## 2020-04-28 NOTE — Addendum Note (Signed)
Addended by: Thomes Cake on: 04/28/2020 01:42 PM   Modules accepted: Orders

## 2020-04-28 NOTE — Progress Notes (Signed)
Subjective  CC:  Chief Complaint  Patient presents with  . Herpes Zoster    Received her shingles shot 04-05-2020, later that evening she started to feel bad and she took otc tylenol. Believes its getting better. She would like to discuss the shot.   . Health Maintenance    Flu Shot in office today. She will get her Tdap and Pneumo vaccine at her next office visit     HPI: Shannon Osborne is a 64 y.o. female who presents to the office today to address the problems listed above in the chief complaint.  I reviewed 12 pages of office notes from her internal medicine office.  Most recent immunizations are not documented.  I reviewed last office visit for the above-stated complaint.  64 year old female who has history of left-sided facial shingles about 10 years ago had the first Shingrix vaccination experienced tingling on the left side of her face including the eyes but it did go to the right side as well.  Eye exam was unremarkable.  She completed a short course of Valtrex.  She is now feeling better.  She did have myalgias, arthralgias and low-grade fever after the shot as well.  She was vaccinated with the Zostrix vaccine in the past.  She wants to know if she should get the second Shingrix vaccination.  She does report that she will get tingling burning sensation on the left side of her face when exposed to people who have had shingles.  She has had her Covid vaccinations.  She is due for flu shot.  She believes she has had one pneumonia shot but I cannot find documentation of which or when.  She may have had a DT booster in 2019.  Assessment  1. Adverse effect of vaccine, initial encounter   2. Postherpetic neuralgia   3. Vaccine counseling      Plan   Adverse effect shingrix:  Counseling done (30 minute visit in total with history, medical records review, exam and plan). Discussed risks vs benefits of taking the 2nd dose. I believe it is safe (ie, not contraindicated), however, it is  likely that she would get the same similar side effect.  I suspect she has postherpetic neuralgia and these immunizations are irritating it.  There is no sign of anaphylaxis.  We could get the second dose and premedicate with Advil, Benadryl and gabapentin.  Not sure this outweighs possible benefits.  She has already had the Zostrix vaccine, shingles proper and 1 dose of Shingrix.  We will discuss again at next visit and then make a decision.  And leaning towards avoiding it.  Postherpetic neuralgia: Mild and tolerable.  Uses Tylenol when needed.  Counseling: Patient will check with her old office to find out which Pneumovax or Prevnar she had.  I can then update her immunizations.  Flu shot updated today  Follow up: As scheduled 08/23/2020  No orders of the defined types were placed in this encounter.  No orders of the defined types were placed in this encounter.     I reviewed the patients updated PMH, FH, and SocHx.    Patient Active Problem List   Diagnosis Date Noted  . History of breast cancer 02/18/2020    Priority: High  . 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    Priority: High  . Myalgia due to statin 02/18/2020    Priority: High  . Mixed hyperlipidemia 02/18/2020    Priority: High  . Multiple drug allergies 02/18/2020  Priority: High  . 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    Priority: Medium  . GERD (gastroesophageal reflux disease) 02/18/2020    Priority: Medium  . Colon polyp 02/18/2020    Priority: Medium  . 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 Post-traumatic arthritis of right foot 01/16/2020    Priority: Medium  . Status post bilateral mastectomy 02/18/2020    Priority: Low  . Status post shoulder replacement, 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    Priority: Low  . Postherpetic neuralgia 04/28/2020  . Chronic midline low back pain without sciatica 02/18/2020   Current Meds  Medication Sig  . carisoprodol (SOMA) 350 MG tablet Take 350 mg by  mouth 3 (three) times daily.  . Coenzyme Q10 10 MG capsule Take by mouth.  . diclofenac Sodium (VOLTAREN) 1 % GEL Apply topically 4 (four) times daily.  . ergocalciferol (VITAMIN D2) 1.25 MG (50000 UT) capsule   . ezetimibe (ZETIA) 10 MG tablet Take 10 mg by mouth daily.  . Fluticasone Propionate 0.05 % LOTN Apply topically.  Marland Kitchen LORazepam (ATIVAN) 0.5 MG tablet as needed.  . Melatonin 10 MG CAPS Take by mouth.  Marland Kitchen omeprazole (PRILOSEC) 20 MG capsule Take 1 capsule (20 mg total) by mouth 2 (two) times daily before a meal.  . Pitavastatin Calcium (LIVALO) 4 MG TABS Take by mouth.  . Polyethylene Glycol 3350 (MIRALAX PO) Take by mouth.  . Probiotic Product (PROBIOTIC + OMEGA-3 PO) Take 1 tablet by mouth daily.    Allergies: Patient is allergic to baclofen, cyclobenzaprine, escitalopram oxalate, iodine, rosuvastatin, sertraline, simvastatin, atorvastatin, codeine, contrast media [iodinated diagnostic agents], hydrochlorothiazide w-triamterene, hydrocodone-acetaminophen, and oxycodone hcl. Family History: Patient family history includes Colon polyps in her mother; Gallstones in her brother, brother, brother, and mother; Hypertension in her brother, brother, brother, and mother; Stroke in her father. Social History:  Patient  reports that she has never smoked. She has never used smokeless tobacco. She reports current alcohol use. She reports that she does not use drugs.  Review of Systems: Constitutional: Negative for fever malaise or anorexia Cardiovascular: negative for chest pain Respiratory: negative for SOB or persistent cough Gastrointestinal: negative for abdominal pain  Objective  Vitals: BP 124/62   Pulse 76   Temp 98 F (36.7 C) (Temporal)   Resp 18   Ht 5' (1.524 m)   Wt 137 lb 12.8 oz (62.5 kg)   SpO2 98%   BMI 26.91 kg/m  General: no acute distress , A&Ox3 HEENT: PEERL, conjunctiva normal, neck is supple Skin:  Warm, no rashes, no vesicles     Commons side effects,  risks, benefits, and alternatives for medications and treatment plan prescribed today were discussed, and the patient expressed understanding of the given instructions. Patient is instructed to call or message via MyChart if he/she has any questions or concerns regarding our treatment plan. No barriers to understanding were identified. We discussed Red Flag symptoms and signs in detail. Patient expressed understanding regarding what to do in case of urgent or emergency type symptoms.   Medication list was reconciled, printed and provided to the patient in AVS. Patient instructions and summary information was reviewed with the patient as documented in the AVS. This note was prepared with assistance of Dragon voice recognition software. Occasional wrong-word or sound-a-like substitutions may have occurred due to the inherent limitations of voice recognition software  This visit occurred during the SARS-CoV-2 public health emergency.  Safety protocols were in place, including screening questions prior to the  visit, additional usage of staff PPE, and extensive cleaning of exam room while observing appropriate contact time as indicated for disinfecting solutions.

## 2020-04-28 NOTE — Patient Instructions (Signed)
Please follow up as scheduled for your next visit with me: 08/23/2020   If you have any questions or concerns, please don't hesitate to send me a message via MyChart or call the office at [phone]. Thank you for visiting with [place] today! It's our pleasure caring for you.  Please call your old PCP office and verify which pneumonia vaccine you've had and when. Ask if Prevnar or Pneumovax. Thanks!  Today you were given your flu vaccination.

## 2020-05-06 ENCOUNTER — Encounter: Payer: Self-pay | Admitting: Family Medicine

## 2020-05-13 ENCOUNTER — Other Ambulatory Visit: Payer: Self-pay | Admitting: Family Medicine

## 2020-07-09 ENCOUNTER — Telehealth: Payer: Self-pay

## 2020-07-09 NOTE — Telephone Encounter (Signed)
Patient is a previous breast cancer survivor and would like to been seen soon she has a lump under her left arm. Its not changing in size and is getting tender when she touches it a lot. Please advise if we can use same day slot for patient

## 2020-07-09 NOTE — Telephone Encounter (Signed)
Please use same day slot next available

## 2020-07-12 NOTE — Telephone Encounter (Signed)
Patient scheduled.

## 2020-07-20 ENCOUNTER — Ambulatory Visit: Payer: Medicare Other | Admitting: Family Medicine

## 2020-07-27 ENCOUNTER — Ambulatory Visit (INDEPENDENT_AMBULATORY_CARE_PROVIDER_SITE_OTHER): Payer: Medicare Other | Admitting: Family Medicine

## 2020-07-27 ENCOUNTER — Encounter: Payer: Self-pay | Admitting: Family Medicine

## 2020-07-27 ENCOUNTER — Other Ambulatory Visit: Payer: Self-pay

## 2020-07-27 VITALS — BP 128/80 | HR 77 | Temp 97.1°F | Wt 143.2 lb

## 2020-07-27 DIAGNOSIS — R599 Enlarged lymph nodes, unspecified: Secondary | ICD-10-CM

## 2020-07-27 NOTE — Progress Notes (Signed)
Subjective  CC:  Chief Complaint  Patient presents with  . Cyst    left underarm, family hx of breast cancer    HPI: Shannon Osborne is a 64 y.o. female who presents to the office today to address the problems listed above in the chief complaint.  64 year old with history of breast cancer and BRCA 1 gene mutation status post bilateral mastectomy reports noticing a small lump in her upper left arm about 2 to 3 months ago.  This started after receiving her Shingrix vaccination.  3 weeks later she received the flu vaccine.  She reports of swelling in the upper extremity after both vaccinations.  She has minimal tenderness.  She noted the lump in the mirror while showering.  There is been no redness.  There is been no growth.  No drainage.  She is also having some shoulder pain and has an appointment with her orthopedic surgeon next week.  She is status post shoulder replacement on that side.   Assessment  1. Reactive lymphadenopathy      Plan   Lump in left upper extremity: Distal to axillary nodes.  Very small.  Minimally tender.  Favor reactive lymphadenopathy from recent vaccinations.  Will monitor closely.  She will return in January for follow-up.  Blood work and further imaging studies if persists.  Patient understands and agrees with care plan  Follow up: Mid-to-late January for recheck 11/29/2020 for complete physical  No orders of the defined types were placed in this encounter.  No orders of the defined types were placed in this encounter.     I reviewed the patients updated PMH, FH, and SocHx.    Patient Active Problem List   Diagnosis Date Noted  . History of breast cancer 02/18/2020    Priority: High  . 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    Priority: High  . Myalgia due to statin 02/18/2020    Priority: High  . Mixed hyperlipidemia 02/18/2020    Priority: High  . Multiple drug allergies 02/18/2020    Priority: High  . 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     Priority: Medium  . GERD (gastroesophageal reflux disease) 02/18/2020    Priority: Medium  . Colon polyp 02/18/2020    Priority: Medium  . 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 Post-traumatic arthritis of right foot 01/16/2020    Priority: Medium  . Status post bilateral mastectomy 02/18/2020    Priority: Low  . Status post shoulder replacement, 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    Priority: Low  . Postherpetic neuralgia 04/28/2020  . Chronic midline low back pain without sciatica 02/18/2020   Current Meds  Medication Sig  . aspirin EC 325 MG tablet Take 325 mg by mouth daily.  . carisoprodol (SOMA) 350 MG tablet Take 350 mg by mouth 3 (three) times daily.  . Coenzyme Q10 10 MG capsule Take by mouth.  . diclofenac Sodium (VOLTAREN) 1 % GEL Apply topically 4 (four) times daily.  . ergocalciferol (VITAMIN D2) 1.25 MG (50000 UT) capsule   . ezetimibe (ZETIA) 10 MG tablet Take 10 mg by mouth daily.  . Fluticasone Propionate 0.05 % LOTN Apply topically.  . gabapentin (NEURONTIN) 300 MG capsule Take by mouth.  Marland Kitchen LORazepam (ATIVAN) 0.5 MG tablet as needed.  . Melatonin 10 MG CAPS Take by mouth.  Marland Kitchen omeprazole (PRILOSEC) 20 MG capsule TAKE 1 CAPSULE(20 MG) BY MOUTH TWICE DAILY BEFORE A MEAL  . ondansetron (ZOFRAN) 4 MG tablet Take by  mouth.  . Pitavastatin Calcium (LIVALO) 4 MG TABS Take by mouth.  . Polyethylene Glycol 3350 (MIRALAX PO) Take by mouth.  . Probiotic Product (PROBIOTIC + OMEGA-3 PO) Take 1 tablet by mouth daily.    Allergies: Patient is allergic to baclofen, cyclobenzaprine, escitalopram oxalate, iodine, rosuvastatin, sertraline, simvastatin, atorvastatin, codeine, contrast media [iodinated diagnostic agents], hydrochlorothiazide w-triamterene, hydrocodone-acetaminophen, and oxycodone hcl. Family History: Patient family history includes Colon polyps in her mother; Gallstones in her brother, brother, brother, and mother; Hypertension in her brother, brother,  brother, and mother; Stroke in her father. Social History:  Patient  reports that she has never smoked. She has never used smokeless tobacco. She reports current alcohol use. She reports that she does not use drugs.  Review of Systems: Constitutional: Negative for fever malaise or anorexia Cardiovascular: negative for chest pain Respiratory: negative for SOB or persistent cough Gastrointestinal: negative for abdominal pain  Objective  Vitals: BP 128/80   Pulse 77   Temp (!) 97.1 F (36.2 C) (Temporal)   Wt 143 lb 3.2 oz (65 kg)   SpO2 98%   BMI 27.97 kg/m  General: no acute distress , A&Ox3 Left axillary without new lumps, she has a known axillary node likely imaged and biopsied in the past.  This is palpable and nontender.  Distally there is a less than 1 cm nontender mass in the lymph node chain between bicep and tricep.  There is no redness or fluctuance.     Commons side effects, risks, benefits, and alternatives for medications and treatment plan prescribed today were discussed, and the patient expressed understanding of the given instructions. Patient is instructed to call or message via MyChart if he/she has any questions or concerns regarding our treatment plan. No barriers to understanding were identified. We discussed Red Flag symptoms and signs in detail. Patient expressed understanding regarding what to do in case of urgent or emergency type symptoms.   Medication list was reconciled, printed and provided to the patient in AVS. Patient instructions and summary information was reviewed with the patient as documented in the AVS. This note was prepared with assistance of Dragon voice recognition software. Occasional wrong-word or sound-a-like substitutions may have occurred due to the inherent limitations of voice recognition software  This visit occurred during the SARS-CoV-2 public health emergency.  Safety protocols were in place, including screening questions prior to the  visit, additional usage of staff PPE, and extensive cleaning of exam room while observing appropriate contact time as indicated for disinfecting solutions.

## 2020-07-27 NOTE — Patient Instructions (Signed)
Please return in mid to late January for a recheck of your arm.   If you have any questions or concerns, please don't hesitate to send me a message via MyChart or call the office at [phone]. Thank you for visiting with [place] today! It's our pleasure caring for you.

## 2020-08-05 DIAGNOSIS — M75122 Complete rotator cuff tear or rupture of left shoulder, not specified as traumatic: Secondary | ICD-10-CM | POA: Insufficient documentation

## 2020-08-09 ENCOUNTER — Other Ambulatory Visit: Payer: Self-pay | Admitting: Orthopedic Surgery

## 2020-08-09 DIAGNOSIS — M75122 Complete rotator cuff tear or rupture of left shoulder, not specified as traumatic: Secondary | ICD-10-CM

## 2020-08-17 MED ORDER — PREDNISONE 50 MG PO TABS: ORAL_TABLET | ORAL | 0 refills | Status: DC

## 2020-08-17 NOTE — Progress Notes (Signed)
Phone call to patient to review instructions for 13 hr prep for arthrogram with CT contrrast and MRI contrast on 08/30/20  at 1400. Prescription called into [facility] [employer] Pt aware and verbalized understanding of instructions. Prescription: 08/30/20 @ 1 am- 50mg  Prednisone 08/30/20 @ 7am- 50mg  Prednisone 08/30/20 @ 1pm - 50mg  Prednisone and 50mg  Benadryl

## 2020-08-23 ENCOUNTER — Ambulatory Visit: Payer: Federal, State, Local not specified - PPO | Admitting: Family Medicine

## 2020-08-23 ENCOUNTER — Encounter: Payer: Federal, State, Local not specified - PPO | Admitting: Family Medicine

## 2020-08-30 ENCOUNTER — Other Ambulatory Visit: Payer: Self-pay

## 2020-08-30 ENCOUNTER — Ambulatory Visit
Admission: RE | Admit: 2020-08-30 | Discharge: 2020-08-30 | Disposition: A | Discharge status: Home / Self Care | Payer: Medicare Other | Source: Ambulatory Visit | Attending: Orthopedic Surgery | Admitting: Orthopedic Surgery

## 2020-08-30 DIAGNOSIS — M75122 Complete rotator cuff tear or rupture of left shoulder, not specified as traumatic: Secondary | ICD-10-CM

## 2020-08-30 MED ORDER — IOPAMIDOL (ISOVUE-M 200) INJECTION 41%
13.0000 mL | Freq: Once | INTRAMUSCULAR | Status: AC
  Administered 2020-08-30: 13 mL via INTRA_ARTICULAR

## 2020-08-30 MED ORDER — IOPAMIDOL (ISOVUE-M 200) INJECTION 41%
12.0000 mL | Freq: Once | INTRAMUSCULAR | Status: AC
  Administered 2020-08-30: 12 mL via INTRA_ARTICULAR

## 2020-09-13 ENCOUNTER — Ambulatory Visit: Payer: Medicare Other | Admitting: Family Medicine

## 2020-09-16 ENCOUNTER — Encounter: Payer: Self-pay | Admitting: Family Medicine

## 2020-09-16 ENCOUNTER — Other Ambulatory Visit: Payer: Self-pay

## 2020-09-16 ENCOUNTER — Ambulatory Visit (INDEPENDENT_AMBULATORY_CARE_PROVIDER_SITE_OTHER): Payer: Medicare Other | Admitting: Family Medicine

## 2020-09-16 VITALS — BP 136/80 | HR 98 | Temp 98.2°F | Ht 60.0 in | Wt 143.0 lb

## 2020-09-16 DIAGNOSIS — R2232 Localized swelling, mass and lump, left upper limb: Secondary | ICD-10-CM

## 2020-09-16 DIAGNOSIS — Z853 Personal history of malignant neoplasm of breast: Secondary | ICD-10-CM | POA: Diagnosis not present

## 2020-09-16 DIAGNOSIS — Z1501 Genetic susceptibility to malignant neoplasm of breast: Secondary | ICD-10-CM

## 2020-09-16 DIAGNOSIS — Z1509 Genetic susceptibility to other malignant neoplasm: Secondary | ICD-10-CM

## 2020-09-16 DIAGNOSIS — E782 Mixed hyperlipidemia: Secondary | ICD-10-CM

## 2020-09-16 LAB — CBC WITH DIFFERENTIAL/PLATELET
Basophils Absolute: 0 10*3/uL (ref 0.0–0.1)
Basophils Relative: 0.5 % (ref 0.0–3.0)
Eosinophils Absolute: 0.1 10*3/uL (ref 0.0–0.7)
Eosinophils Relative: 1.6 % (ref 0.0–5.0)
HCT: 36.6 % (ref 36.0–46.0)
Hemoglobin: 12.4 g/dL (ref 12.0–15.0)
Lymphocytes Relative: 24.2 % (ref 12.0–46.0)
Lymphs Abs: 1.5 10*3/uL (ref 0.7–4.0)
MCHC: 34 g/dL (ref 30.0–36.0)
MCV: 86.7 fl (ref 78.0–100.0)
Monocytes Absolute: 0.6 10*3/uL (ref 0.1–1.0)
Monocytes Relative: 10.2 % (ref 3.0–12.0)
Neutro Abs: 3.9 10*3/uL (ref 1.4–7.7)
Neutrophils Relative %: 63.5 % (ref 43.0–77.0)
Platelets: 306 10*3/uL (ref 150.0–400.0)
RBC: 4.22 Mil/uL (ref 3.87–5.11)
RDW: 12.9 % (ref 11.5–15.5)
WBC: 6.1 10*3/uL (ref 4.0–10.5)

## 2020-09-16 LAB — SEDIMENTATION RATE: Sed Rate: 34 mm/hr — ABNORMAL HIGH (ref 0–30)

## 2020-09-16 MED ORDER — EZETIMIBE 10 MG PO TABS: 10.0000 mg | ORAL_TABLET | Freq: Every evening | ORAL | 3 refills | Status: DC

## 2020-09-16 MED ORDER — LIVALO 4 MG PO TABS: 4.0000 mg | ORAL_TABLET | Freq: Every day | ORAL | 3 refills | Status: DC

## 2020-09-16 NOTE — Progress Notes (Signed)
Subjective  CC:  Chief Complaint  Patient presents with  . Follow-up    Re-check on lump left upper extremity. Pt says it is slightly bigger. Denies pain.    HPI: Shannon Osborne is a 65 y.o. female who presents to the office today to address the problems listed above in the chief complaint.  Left axillary mass: Referred to let us know.  In brief, 65 year old with history of breast cancer, BRCA1 positive, status post bilateral mastectomy years ago has persistent left axillary/upper arm mass.  Since early December, she feels it is a little bit bigger.  She is obviously concerned given her history of BRCA1 positivity and breast cancer.  No new breast masses or lumps noted.  To review, she does have one larger axillary node that has been biopsied in the past, it was benign.  She otherwise is feeling well.  Hyperlipidemia with LDL at goal: Needs refills on medications. Assessment  1. Axillary mass, left   2. History of breast cancer   3. BRCA1 gene mutation positive   4. Mixed hyperlipidemia      Plan   Axillary mass in a BRCA1 positive patient with history of breast cancer: Refer to breast center for ultrasound evaluation.  May warrant biopsy.  CBC and sed rate ordered.  Refilled cholesterol medications  Follow up: As scheduled 11/29/2020  Orders Placed This Encounter  Procedures  . [place] AXILLA LEFT  . CBC with Differential/Platelet  . Sedimentation rate   Meds ordered this encounter  Medications  . Pitavastatin Calcium (LIVALO) 4 MG TABS    Sig: Take 1 tablet (4 mg total) by mouth daily.    Dispense:  90 tablet    Refill:  3  . ezetimibe (ZETIA) 10 MG tablet    Sig: Take 1 tablet (10 mg total) by mouth at bedtime.    Dispense:  90 tablet    Refill:  3      I reviewed the patients updated PMH, FH, and SocHx.    Patient Active Problem List   Diagnosis Date Noted  . History of breast cancer 02/18/2020    Priority: High  . 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     Priority: High  . Myalgia due to statin 02/18/2020    Priority: High  . Mixed hyperlipidemia 02/18/2020    Priority: High  . Multiple drug allergies 02/18/2020    Priority: High  . 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    Priority: Medium  . GERD (gastroesophageal reflux disease) 02/18/2020    Priority: Medium  . Colon polyp 02/18/2020    Priority: Medium  . 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 Post-traumatic arthritis of right foot 01/16/2020    Priority: Medium  . Status post bilateral mastectomy 02/18/2020    Priority: Low  . Status post shoulder replacement, 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    Priority: Low  . Postherpetic neuralgia 04/28/2020  . Chronic midline low back pain without sciatica 02/18/2020   Current Meds  Medication Sig  . aspirin EC 81 MG tablet Take 81 mg by mouth daily. Swallow whole.  . carisoprodol (SOMA) 350 MG tablet Take 350 mg by mouth 3 (three) times daily.  . Coenzyme Q10 10 MG capsule Take by mouth.  . diclofenac Sodium (VOLTAREN) 1 % GEL Apply topically 4 (four) times daily.  . ergocalciferol (VITAMIN D2) 1.25 MG (50000 UT) capsule   . Fluticasone Propionate 0.05 % LOTN Apply topically.  . gabapentin (NEURONTIN) 300 MG capsule  Take by mouth.  . melatonin 5 MG TABS Take 5 mg by mouth at bedtime as needed.  Marland Kitchen omeprazole (PRILOSEC) 20 MG capsule TAKE 1 CAPSULE(20 MG) BY MOUTH TWICE DAILY BEFORE A MEAL  . Polyethylene Glycol 3350 (MIRALAX PO) Take by mouth.  . Probiotic Product (PROBIOTIC + OMEGA-3 PO) Take 1 tablet by mouth daily.  . [DISCONTINUED] ezetimibe (ZETIA) 10 MG tablet Take 10 mg by mouth daily.  . [DISCONTINUED] Pitavastatin Calcium (LIVALO) 4 MG TABS Take by mouth.    Allergies: Patient is allergic to baclofen, cyclobenzaprine, escitalopram oxalate, iodine, rosuvastatin, sertraline, simvastatin, atorvastatin, codeine, contrast media [iodinated diagnostic agents], hydrochlorothiazide w-triamterene, hydrocodone-acetaminophen,  and oxycodone hcl. Family History: Patient family history includes Colon polyps in her mother; Gallstones in her brother, brother, brother, and mother; Hypertension in her brother, brother, brother, and mother; Stroke in her father. Social History:  Patient  reports that she has never smoked. She has never used smokeless tobacco. She reports current alcohol use. She reports that she does not use drugs.  Review of Systems: Constitutional: Negative for fever malaise or anorexia Cardiovascular: negative for chest pain Respiratory: negative for SOB or persistent cough Gastrointestinal: negative for abdominal pain  Objective  Vitals: BP 136/80 (BP Location: Left Arm, Patient Position: Sitting, Cuff Size: Normal)   Pulse 98   Temp 98.2 F (36.8 C) (Temporal)   Ht 5' (1.524 m)   Wt 143 lb (64.9 kg)   SpO2 96%   BMI 27.93 kg/m  General: no acute distress , A&Ox3 Left axilla: Approximately 1 cm nontender mass noted: Previously biopsied.  Distal to that less than 1 cm slightly tender mobile mass.  No erythema or fluctuance    Commons side effects, risks, benefits, and alternatives for medications and treatment plan prescribed today were discussed, and the patient expressed understanding of the given instructions. Patient is instructed to call or message via MyChart if he/she has any questions or concerns regarding our treatment plan. No barriers to understanding were identified. We discussed Red Flag symptoms and signs in detail. Patient expressed understanding regarding what to do in case of urgent or emergency type symptoms.   Medication list was reconciled, printed and provided to the patient in AVS. Patient instructions and summary information was reviewed with the patient as documented in the AVS. This note was prepared with assistance of Dragon voice recognition software. Occasional wrong-word or sound-a-like substitutions may have occurred due to the inherent limitations of voice  recognition software  This visit occurred during the SARS-CoV-2 public health emergency.  Safety protocols were in place, including screening questions prior to the visit, additional usage of staff PPE, and extensive cleaning of exam room while observing appropriate contact time as indicated for disinfecting solutions.

## 2020-09-16 NOTE — Patient Instructions (Signed)
Please follow up as scheduled for your next visit with me: 11/29/2020   We will call you to get you scheduled for an ultrasound of the underarm and mass at [facility].  Please let me know if you don't hear from [place] within 2 weeks!  I will release your lab results to you on your MyChart account with further instructions. Please reply with any questions.   If you have any questions or concerns, please don't hesitate to send me a message via MyChart or call the office at [phone]. Thank you for visiting with [place] today! It's our pleasure caring for you.

## 2020-10-08 ENCOUNTER — Ambulatory Visit
Admission: RE | Admit: 2020-10-08 | Discharge: 2020-10-08 | Disposition: A | Discharge status: Home / Self Care | Payer: Medicare Other | Source: Ambulatory Visit | Attending: Family Medicine | Admitting: Family Medicine

## 2020-10-08 ENCOUNTER — Other Ambulatory Visit: Payer: Self-pay

## 2020-10-08 DIAGNOSIS — R2232 Localized swelling, mass and lump, left upper limb: Secondary | ICD-10-CM

## 2020-11-29 ENCOUNTER — Encounter: Payer: Self-pay | Admitting: Family Medicine

## 2020-11-29 ENCOUNTER — Other Ambulatory Visit: Payer: Self-pay

## 2020-11-29 ENCOUNTER — Ambulatory Visit (INDEPENDENT_AMBULATORY_CARE_PROVIDER_SITE_OTHER): Payer: Medicare Other | Admitting: Family Medicine

## 2020-11-29 VITALS — BP 152/80 | HR 80 | Temp 97.8°F | Ht 60.0 in | Wt 142.6 lb

## 2020-11-29 DIAGNOSIS — E782 Mixed hyperlipidemia: Secondary | ICD-10-CM

## 2020-11-29 DIAGNOSIS — R030 Elevated blood-pressure reading, without diagnosis of hypertension: Secondary | ICD-10-CM

## 2020-11-29 DIAGNOSIS — Z79899 Other long term (current) drug therapy: Secondary | ICD-10-CM | POA: Diagnosis not present

## 2020-11-29 DIAGNOSIS — M545 Low back pain, unspecified: Secondary | ICD-10-CM

## 2020-11-29 DIAGNOSIS — G8929 Other chronic pain: Secondary | ICD-10-CM

## 2020-11-29 DIAGNOSIS — K219 Gastro-esophageal reflux disease without esophagitis: Secondary | ICD-10-CM | POA: Diagnosis not present

## 2020-11-29 DIAGNOSIS — M19171 Post-traumatic osteoarthritis, right ankle and foot: Secondary | ICD-10-CM

## 2020-11-29 DIAGNOSIS — M47816 Spondylosis without myelopathy or radiculopathy, lumbar region: Secondary | ICD-10-CM | POA: Diagnosis not present

## 2020-11-29 DIAGNOSIS — Z23 Encounter for immunization: Secondary | ICD-10-CM | POA: Diagnosis not present

## 2020-11-29 DIAGNOSIS — Z780 Asymptomatic menopausal state: Secondary | ICD-10-CM

## 2020-11-29 DIAGNOSIS — Z853 Personal history of malignant neoplasm of breast: Secondary | ICD-10-CM

## 2020-11-29 DIAGNOSIS — M8580 Other specified disorders of bone density and structure, unspecified site: Secondary | ICD-10-CM

## 2020-11-29 DIAGNOSIS — Z96612 Presence of left artificial shoulder joint: Secondary | ICD-10-CM

## 2020-11-29 LAB — COMPREHENSIVE METABOLIC PANEL
ALT: 19 U/L (ref 0–35)
AST: 16 U/L (ref 0–37)
Albumin: 4.5 g/dL (ref 3.5–5.2)
Alkaline Phosphatase: 85 U/L (ref 39–117)
BUN: 23 mg/dL (ref 6–23)
CO2: 30 mEq/L (ref 19–32)
Calcium: 9.5 mg/dL (ref 8.4–10.5)
Chloride: 101 mEq/L (ref 96–112)
Creatinine, Ser: 0.65 mg/dL (ref 0.40–1.20)
GFR: 91.42 mL/min (ref >60.00)
Glucose, Bld: 82 mg/dL (ref 70–99)
Potassium: 4.2 mEq/L (ref 3.5–5.1)
Sodium: 139 mEq/L (ref 135–145)
Total Bilirubin: 0.6 mg/dL (ref 0.2–1.2)
Total Protein: 6.8 g/dL (ref 6.0–8.3)

## 2020-11-29 LAB — CBC WITH DIFFERENTIAL/PLATELET
Basophils Absolute: 0 10*3/uL (ref 0.0–0.1)
Basophils Relative: 0.6 % (ref 0.0–3.0)
Eosinophils Absolute: 0.1 10*3/uL (ref 0.0–0.7)
Eosinophils Relative: 0.8 % (ref 0.0–5.0)
HCT: 39.1 % (ref 36.0–46.0)
Hemoglobin: 12.9 g/dL (ref 12.0–15.0)
Lymphocytes Relative: 25.8 % (ref 12.0–46.0)
Lymphs Abs: 1.9 10*3/uL (ref 0.7–4.0)
MCHC: 32.9 g/dL (ref 30.0–36.0)
MCV: 87.8 fl (ref 78.0–100.0)
Monocytes Absolute: 0.6 10*3/uL (ref 0.1–1.0)
Monocytes Relative: 8.2 % (ref 3.0–12.0)
Neutro Abs: 4.7 10*3/uL (ref 1.4–7.7)
Neutrophils Relative %: 64.6 % (ref 43.0–77.0)
Platelets: 279 10*3/uL (ref 150.0–400.0)
RBC: 4.45 Mil/uL (ref 3.87–5.11)
RDW: 13.5 % (ref 11.5–15.5)
WBC: 7.2 10*3/uL (ref 4.0–10.5)

## 2020-11-29 LAB — LIPID PANEL
Cholesterol: 206 mg/dL — ABNORMAL HIGH (ref 0–200)
HDL: 62.8 mg/dL (ref >39.00)
LDL Cholesterol: 111 mg/dL — ABNORMAL HIGH (ref 0–99)
NonHDL: 143.1
Total CHOL/HDL Ratio: 3
Triglycerides: 160 mg/dL — ABNORMAL HIGH (ref 0.0–149.0)
VLDL: 32 mg/dL (ref 0.0–40.0)

## 2020-11-29 LAB — VITAMIN B12: Vitamin B-12: 368 pg/mL (ref 211–911)

## 2020-11-29 NOTE — Addendum Note (Signed)
Addended by: Doran Clay A on: 11/29/2020 09:33 AM   Modules accepted: Orders

## 2020-11-29 NOTE — Progress Notes (Signed)
Subjective  Chief Complaint  Patient presents with  . Annual Exam    Fasting, has been released from [facility] for shoulder and feet surgery. Wanting to know if Dr. Jonni Sanger will take over orders for PT  . Health Maintenance    Needs DEXA ordered. Will give Pneumovax 23 today in office     HPI: Shannon Osborne is a 65 y.o. female who presents to [facility] [place] today for a Female Wellness Visit. She also has the concerns and/or needs as listed above in the chief complaint. These will be addressed in addition to the Health Maintenance Visit.   Wellness Visit: annual visit with health maintenance review and exam without Pap   Health maintenance: Due for bone density scan.  Follow-up osteopenia.  Overall doing well.  Is working on recovery from her surgery.  Still having some ambulatory problems and pain.  Will be working with a new physical therapist.  Did gain some weight due to her inactivity.  Due Pneumovax today.  Chronic disease f/u and/or acute problem visit: (deemed necessary to be done in addition to the wellness visit):  GERD: Well-controlled on 20 mg daily.  Foot pain due to postoperative arthritis in recovery from surgery.  Work with physical therapy.  Neuropathic pain on gabapentin, improving.  Working with physical therapist for shoulder pain in the left.  No history of hypertension.  Strong family history of hypertension.  Mildly elevated blood pressure here in the office but has normal blood pressure readings consistently at home with a rare intermittent elevated reading.  No chest pain.  Assessment  1. Mixed hyperlipidemia   2. Gastroesophageal reflux disease without esophagitis   3. Spondylosis of lumbar region without myelopathy or radiculopathy   4. Chronic midline low back pain without sciatica   5. Status post shoulder replacement, left   6. History of breast cancer   7. Long-term current use of proton pump inhibitor therapy   8. Asymptomatic  menopausal state   9. Osteopenia after menopause   10. White coat syndrome without diagnosis of hypertension   11. Post-traumatic arthritis of right foot      Plan  Female Wellness Visit:  Age appropriate Health Maintenance and Prevention measures were discussed with patient. Included topics are cancer screening recommendations, ways to keep healthy (see AVS) including dietary and exercise recommendations, regular eye and dental care, use of seat belts, and avoidance of moderate alcohol use and tobacco use.   BMI: discussed patient's BMI and encouraged positive lifestyle modifications to help get to or maintain a target BMI.  HM needs and immunizations were addressed and ordered. See below for orders. See HM and immunization section for updates.  Pneumovax today  Routine labs and screening tests ordered including cmp, cbc and lipids where appropriate.  Discussed recommendations regarding Vit D and calcium supplementation (see AVS)  Chronic disease management visit and/or acute problem visit:  Whitecoat hypertension: Continued home monitor  Chronic pain: Continue work with physical therapy.  GERD: Doing well, well controlled, wean to 20 mg daily and lower if possible.  Check vitamin B12 levels.  Hyperlipidemia: On Zetia and statin.  Tolerating.  Recheck fasting levels today.  Osteopenia for recheck.  Continue calcium and vitamin D.  Working on becoming more active with weightbearing exercise once recovered from surgery.  Today's visit was 41 minutes long. Greater than 50% of this time was devoted to face to face counseling with the patient and coordination of care. We discussed her  diagnosis, prognosis, treatment options and treatment plan is documented above.   Follow up: 12 months for complete physical Orders Placed This Encounter  Procedures  . DG Bone Density  . CBC with Differential/Platelet  . Comprehensive metabolic panel  . Lipid panel  . Vitamin B12   No orders of  the defined types were placed in this encounter.     Body mass index is 27.85 kg/m. Wt Readings from Last 3 Encounters:  11/29/20 142 lb 9.6 oz (64.7 kg)  09/16/20 143 lb (64.9 kg)  07/27/20 143 lb 3.2 oz (65 kg)     Patient Active Problem List   Diagnosis Date Noted  . History of breast cancer 02/18/2020    Priority: High  . 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    Priority: High  . Myalgia due to statin 02/18/2020    Priority: High  . Mixed hyperlipidemia 02/18/2020    Priority: High  . Multiple drug allergies 02/18/2020    Priority: High  . Long-term current use of proton pump inhibitor therapy 11/29/2020    Priority: Medium  . Postherpetic neuralgia 04/28/2020    Priority: Medium  . 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    Priority: Medium    MRI 05/2018   . GERD (gastroesophageal reflux disease) 02/18/2020    Priority: Medium  . Colon polyp 02/18/2020    Priority: Medium    15 years ago; 2 normals afterwards. Most recent 2021   . Chronic midline low back pain without sciatica 02/18/2020    Priority: Medium  . 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 Post-traumatic arthritis of right foot 01/16/2020    Priority: Medium  . Status post bilateral mastectomy 02/18/2020    Priority: Low  . Status post shoulder replacement, 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    Priority: Low   Health Maintenance  Topic Date Due  . PNA vac Low Risk Adult (2 of 2 - PPSV23) 12/25/2018  . DEXA SCAN  03/11/2020  . INFLUENZA VACCINE  03/21/2021  . COLONOSCOPY (Pts 45-36yr Insurance coverage will need to be confirmed)  10/21/2024  . COVID-19 Vaccine  Completed  . Hepatitis C Screening  Completed  . HPV VACCINES  Aged Out   Immunization History  Administered Date(s) Administered  . Fluad Quad(high Dose 65+) 04/28/2020  . Influenza Whole 05/22/2011  . Influenza-Unspecified 06/26/2015  . PFIZER(Purple Top)SARS-COV-2 Vaccination 10/03/2019, 10/28/2019, 05/28/2020  .  Pneumococcal Conjugate-13 03/17/2015  . Pneumococcal Polysaccharide-23 01/14/2009  . Zoster Recombinat (Shingrix) 04/05/2020   We updated and reviewed the patient's past history in detail and it is documented below. Allergies: Patient is allergic to baclofen, cyclobenzaprine, escitalopram oxalate, iodine, rosuvastatin, sertraline, simvastatin, atorvastatin, codeine, contrast media [iodinated diagnostic agents], hydrochlorothiazide w-triamterene, hydrocodone-acetaminophen, and oxycodone hcl. Past Medical History Patient  has a past medical history of BRCA1 gene mutation positive (02/18/2020), Breast cancer [place] (2009), Chronic midline low back pain without sciatica (02/18/2020), GERD (gastroesophageal reflux disease) (02/18/2020), Hyperlipemia, Multiple drug allergies (02/18/2020), Myalgia due to statin (02/18/2020), and Osteoarthritis of lumbar spine (02/18/2020). Past Surgical History Patient  has a past surgical history that includes Breast surgery; Abdominal hysterectomy (2002); Tonsillectomy (2012); Shoulder arthrotomy (Left, 2013); and Foot bone excision. Family History: Patient family history includes Colon polyps in her mother; Gallstones in her brother, brother, brother, and mother; Hypertension in her brother, brother, brother, and mother; Stroke in her father. Social History:  Patient  reports that she has never smoked. She has never used smokeless tobacco. She reports current alcohol  use. She reports that she does not use drugs.  Review of Systems: Constitutional: negative for fever or malaise Ophthalmic: negative for photophobia, double vision or loss of vision Cardiovascular: negative for chest pain, dyspnea on exertion, or new LE swelling Respiratory: negative for SOB or persistent cough Gastrointestinal: negative for abdominal pain, change in bowel habits or melena Genitourinary: negative for dysuria or gross hematuria, no abnormal uterine bleeding or disharge Musculoskeletal:  negative for new gait disturbance or muscular weakness Integumentary: negative for new or persistent rashes, no breast lumps Neurological: negative for TIA or stroke symptoms Psychiatric: negative for SI or delusions Allergic/Immunologic: negative for hives  Patient Care Team    Relationship Specialty Notifications Start End  Leamon Arnt, MD PCP - General Family Medicine  02/18/20   Verl Dicker, MD Referring Physician Orthopedic Surgery  02/18/20     Objective  Vitals: BP (!) 152/80   Pulse 80   Temp 97.8 F (36.6 C) (Temporal)   Ht 5' (1.524 m)   Wt 142 lb 9.6 oz (64.7 kg)   SpO2 97%   BMI 27.85 kg/m  General:  Well developed, well nourished, no acute distress  Psych:  Alert and orientedx3,normal mood and affect HEENT:  Normocephalic, atraumatic, non-icteric sclera,  supple neck without adenopathy, mass or thyromegaly Cardiovascular:  Normal S1, S2, RRR without gallop, rub or murmur Respiratory:  Good breath sounds bilaterally, CTAB with normal respiratory effort Gastrointestinal: normal bowel sounds, soft, non-tender, no noted masses. No HSM MSK: no deformities, contusions. Joints are without erythema or swelling.  Skin:  Warm, no rashes or suspicious lesions noted Neurologic:    Mental status is normal. CN 2-11 are normal. Gross motor and sensory exams are normal. Normal gait. No tremor    Commons side effects, risks, benefits, and alternatives for medications and treatment plan prescribed today were discussed, and the patient expressed understanding of the given instructions. Patient is instructed to call or message via MyChart if he/she has any questions or concerns regarding our treatment plan. No barriers to understanding were identified. We discussed Red Flag symptoms and signs in detail. Patient expressed understanding regarding what to do in case of urgent or emergency type symptoms.   Medication list was reconciled, printed and provided to the patient in AVS. Patient  instructions and summary information was reviewed with the patient as documented in the AVS. This note was prepared with assistance of Dragon voice recognition software. Occasional wrong-word or sound-a-like substitutions may have occurred due to the inherent limitations of voice recognition software  This visit occurred during the SARS-CoV-2 public health emergency.  Safety protocols were in place, including screening questions prior to the visit, additional usage of staff PPE, and extensive cleaning of exam room while observing appropriate contact time as indicated for disinfecting solutions.

## 2020-11-29 NOTE — Patient Instructions (Signed)
Please return in 12 months for your annual complete physical; please come fasting.  I will release your lab results to you on your MyChart account with further instructions. Please reply with any questions.   Today you were given your Pneumovax vaccination.   If you have any questions or concerns, please don't hesitate to send me a message via MyChart or call the office at [phone]. Thank you for visiting with [place] today! It's our pleasure caring for you.

## 2020-12-23 ENCOUNTER — Other Ambulatory Visit: Payer: Self-pay | Admitting: Family Medicine

## 2020-12-23 DIAGNOSIS — M8580 Other specified disorders of bone density and structure, unspecified site: Secondary | ICD-10-CM

## 2021-01-26 ENCOUNTER — Other Ambulatory Visit: Payer: Self-pay

## 2021-01-26 ENCOUNTER — Ambulatory Visit
Admission: RE | Admit: 2021-01-26 | Discharge: 2021-01-26 | Disposition: A | Discharge status: Home / Self Care | Payer: Medicare Other | Source: Ambulatory Visit | Attending: Family Medicine | Admitting: Family Medicine

## 2021-01-26 DIAGNOSIS — M8580 Other specified disorders of bone density and structure, unspecified site: Secondary | ICD-10-CM

## 2021-01-26 DIAGNOSIS — Z780 Asymptomatic menopausal state: Secondary | ICD-10-CM

## 2021-02-11 ENCOUNTER — Encounter: Payer: Self-pay | Admitting: Family Medicine

## 2021-02-11 DIAGNOSIS — Z780 Asymptomatic menopausal state: Secondary | ICD-10-CM | POA: Insufficient documentation

## 2021-02-11 HISTORY — DX: Asymptomatic menopausal state: Z78.0

## 2021-02-17 ENCOUNTER — Ambulatory Visit: Payer: Medicare Other

## 2021-02-18 ENCOUNTER — Other Ambulatory Visit: Payer: Self-pay | Admitting: Family Medicine

## 2021-02-18 ENCOUNTER — Other Ambulatory Visit: Payer: Self-pay

## 2021-02-18 ENCOUNTER — Ambulatory Visit (INDEPENDENT_AMBULATORY_CARE_PROVIDER_SITE_OTHER): Payer: Medicare Other

## 2021-02-18 VITALS — BP 138/76 | HR 85 | Temp 97.1°F | Wt 144.8 lb

## 2021-02-18 DIAGNOSIS — Z0000 Encounter for general adult medical examination without abnormal findings: Secondary | ICD-10-CM | POA: Diagnosis not present

## 2021-02-18 NOTE — Patient Instructions (Addendum)
Shannon Osborne , Thank you for taking time to come for your Medicare Wellness Visit. I appreciate your ongoing commitment to your health goals. Please review the following plan we discussed and let me know if I can assist you in the future.   Screening recommendations/referrals: Colonoscopy: Done 10/22/19 repeat in 5 years 10/21/24 Mammogram: Not a candidate  Bone Density: Done 01/26/21 repeat in 2 years 01/27/23 Recommended yearly ophthalmology/optometry visit for glaucoma screening and checkup Recommended yearly dental visit for hygiene and checkup  Vaccinations: Influenza vaccine: Due 03/21/21 Pneumococcal vaccine: Completed  Tdap vaccine: Not a candidate  Shingles vaccine: 1st dose 04/05/20  will confer with PCP related to reaction after injection Covid-19:Completed 2/2, 3/9/, & 05/28/20  Advanced directives: Please bring a copy of your health care power of attorney and living will to the office at your convenience.  Conditions/risks identified: walk 2 miles in 1 hour to be physically strong   Next appointment: Follow up in one year for your annual wellness visit    Preventive Care 65 Years and Older, Female Preventive care refers to lifestyle choices and visits with your health care provider that can promote health and wellness. What does preventive care include? A yearly physical exam. This is also called an annual well check. Dental exams once or twice a year. Routine eye exams. Ask your health care provider how often you should have your eyes checked. Personal lifestyle choices, including: Daily care of your teeth and gums. Regular physical activity. Eating a healthy diet. Avoiding tobacco and drug use. Limiting alcohol use. Practicing safe sex. Taking low-dose aspirin every day. Taking vitamin and mineral supplements as recommended by your health care provider. What happens during an annual well check? The services and screenings done by your health care provider during your annual  well check will depend on your age, overall health, lifestyle risk factors, and family history of disease. Counseling  Your health care provider may ask you questions about your: Alcohol use. Tobacco use. Drug use. Emotional well-being. Home and relationship well-being. Sexual activity. Eating habits. History of falls. Memory and ability to understand (cognition). Work and work Statistician. Reproductive health. Screening  You may have the following tests or measurements: Height, weight, and BMI. Blood pressure. Lipid and cholesterol levels. These may be checked every 5 years, or more frequently if you are over 38 years old. Skin check. Lung cancer screening. You may have this screening every year starting at age 2 if you have a 30-pack-year history of smoking and currently smoke or have quit within the past 15 years. Fecal occult blood test (FOBT) of the stool. You may have this test every year starting at age 60. Flexible sigmoidoscopy or colonoscopy. You may have a sigmoidoscopy every 5 years or a colonoscopy every 10 years starting at age 52. Hepatitis C blood test. Hepatitis B blood test. Sexually transmitted disease (STD) testing. Diabetes screening. This is done by checking your blood sugar (glucose) after you have not eaten for a while (fasting). You may have this done every 1-3 years. Bone density scan. This is done to screen for osteoporosis. You may have this done starting at age 46. Mammogram. This may be done every 1-2 years. Talk to your health care provider about how often you should have regular mammograms. Talk with your health care provider about your test results, treatment options, and if necessary, the need for more tests. Vaccines  Your health care provider may recommend certain vaccines, such as: Influenza vaccine. This is recommended  every year. Tetanus, diphtheria, and acellular pertussis (Tdap, Td) vaccine. You may need a Td booster every 10 years. Zoster  vaccine. You may need this after age 31. Pneumococcal 13-valent conjugate (PCV13) vaccine. One dose is recommended after age 32. Pneumococcal polysaccharide (PPSV23) vaccine. One dose is recommended after age 51. Talk to your health care provider about which screenings and vaccines you need and how often you need them. This information is not intended to replace advice given to you by your health care provider. Make sure you discuss any questions you have with your health care provider. Document Released: 09/03/2015 Document Revised: 04/26/2016 Document Reviewed: 06/08/2015 Elsevier Interactive Patient Education  2017 [employer] Prevention in the Home Falls can cause injuries. They can happen to people of all ages. There are many things you can do to make your home safe and to help prevent falls. What can I do on the outside of my home? Regularly fix the edges of walkways and driveways and fix any cracks. Remove anything that might make you trip as you walk through a door, such as a raised step or threshold. Trim any bushes or trees on the path to your home. Use bright outdoor lighting. Clear any walking paths of anything that might make someone trip, such as rocks or tools. Regularly check to see if handrails are loose or broken. Make sure that both sides of any steps have handrails. Any raised decks and porches should have guardrails on the edges. Have any leaves, snow, or ice cleared regularly. Use sand or salt on walking paths during winter. Clean up any spills in your garage right away. This includes oil or grease spills. What can I do in the bathroom? Use night lights. Install grab bars by the toilet and in the tub and shower. Do not use towel bars as grab bars. Use non-skid mats or decals in the tub or shower. If you need to sit down in the shower, use a plastic, non-slip stool. Keep the floor dry. Clean up any water that spills on the floor as soon as it happens. Remove  soap buildup in the tub or shower regularly. Attach bath mats securely with double-sided non-slip rug tape. Do not have throw rugs and other things on the floor that can make you trip. What can I do in the bedroom? Use night lights. Make sure that you have a light by your bed that is easy to reach. Do not use any sheets or blankets that are too big for your bed. They should not hang down onto the floor. Have a firm chair that has side arms. You can use this for support while you get dressed. Do not have throw rugs and other things on the floor that can make you trip. What can I do in the kitchen? Clean up any spills right away. Avoid walking on wet floors. Keep items that you use a lot in easy-to-reach places. If you need to reach something above you, use a strong step stool that has a grab bar. Keep electrical cords out of the way. Do not use floor polish or wax that makes floors slippery. If you must use wax, use non-skid floor wax. Do not have throw rugs and other things on the floor that can make you trip. What can I do with my stairs? Do not leave any items on the stairs. Make sure that there are handrails on both sides of the stairs and use them. Fix handrails that are broken  or loose. Make sure that handrails are as long as the stairways. Check any carpeting to make sure that it is firmly attached to the stairs. Fix any carpet that is loose or worn. Avoid having throw rugs at the top or bottom of the stairs. If you do have throw rugs, attach them to the floor with carpet tape. Make sure that you have a light switch at the top of the stairs and the bottom of the stairs. If you do not have them, ask someone to add them for you. What else can I do to help prevent falls? Wear shoes that: Do not have high heels. Have rubber bottoms. Are comfortable and fit you well. Are closed at the toe. Do not wear sandals. If you use a stepladder: Make sure that it is fully opened. Do not climb a  closed stepladder. Make sure that both sides of the stepladder are locked into place. Ask someone to hold it for you, if possible. Clearly mark and make sure that you can see: Any grab bars or handrails. First and last steps. Where the edge of each step is. Use tools that help you move around (mobility aids) if they are needed. These include: Canes. Walkers. Scooters. Crutches. Turn on the lights when you go into a dark area. Replace any light bulbs as soon as they burn out. Set up your furniture so you have a clear path. Avoid moving your furniture around. If any of your floors are uneven, fix them. If there are any pets around you, be aware of where they are. Review your medicines with your doctor. Some medicines can make you feel dizzy. This can increase your chance of falling. Ask your doctor what other things that you can do to help prevent falls. This information is not intended to replace advice given to you by your health care provider. Make sure you discuss any questions you have with your health care provider. Document Released: 06/03/2009 Document Revised: 01/13/2016 Document Reviewed: 09/11/2014 Elsevier Interactive Patient Education  2017 [employer].

## 2021-02-18 NOTE — Progress Notes (Signed)
Subjective:   Shannon Osborne is a 65 y.o. female who presents for an Initial Medicare Annual Wellness Visit.  Review of Systems           Objective:    There were no vitals filed for this visit. There is no height or weight on file to calculate BMI.  No flowsheet data found.  Current Medications (verified) Outpatient Encounter Medications as of 02/18/2021  Medication Sig   aspirin EC 81 MG tablet Take 81 mg by mouth daily. Swallow whole.   carisoprodol (SOMA) 350 MG tablet Take 350 mg by mouth 3 (three) times daily.   Coenzyme Q10 10 MG capsule Take by mouth.   diclofenac Sodium (VOLTAREN) 1 % GEL Apply topically 4 (four) times daily.   ergocalciferol (VITAMIN D2) 1.25 MG (50000 UT) capsule    ezetimibe (ZETIA) 10 MG tablet Take 1 tablet (10 mg total) by mouth at bedtime.   Fluticasone Propionate 0.05 % LOTN Apply topically.   gabapentin (NEURONTIN) 300 MG capsule Take by mouth 2 (two) times daily.   melatonin 5 MG TABS Take 5 mg by mouth at bedtime as needed.   omeprazole (PRILOSEC) 20 MG capsule TAKE 1 CAPSULE(20 MG) BY MOUTH TWICE DAILY BEFORE A MEAL   Pitavastatin Calcium (LIVALO) 4 MG TABS Take 1 tablet (4 mg total) by mouth daily.   Polyethylene Glycol 3350 (MIRALAX PO) Take by mouth.   Probiotic Product (PROBIOTIC + OMEGA-3 PO) Take 1 tablet by mouth daily.   No facility-administered encounter medications on file as of 02/18/2021.    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   Breast cancer ([place]) 2009   BRACA 1 Pos    Chronic midline low back pain without sciatica 02/18/2020   GERD (gastroesophageal reflux disease) 02/18/2020   Hyperlipemia    Multiple drug  allergies 02/18/2020   Myalgia due to statin 02/18/2020   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   MRI 05/2018   Osteopenia after menopause 02/11/2021   DEXA 01/2021 lowest T = -1.7 femur; recheck 2-3 years   Past Surgical History:  Procedure Laterality Date   ABDOMINAL HYSTERECTOMY  2002   BREAST SURGERY     right 09/10/2007, left 06/16/2009   FOOT BONE EXCISION     SHOULDER ARTHROTOMY Left 2013   and 06/2015   TONSILLECTOMY  2012   Family History  Problem Relation Age of Onset   Hypertension Mother    Colon polyps Mother    Gallstones Mother    Stroke Father    Hypertension Brother    Gallstones Brother    Hypertension Brother    Gallstones Brother    Hypertension Brother    Gallstones Brother    Social History   Socioeconomic History   Marital status: Single    Spouse name: Not on file   Number of children: Not on file   Years of education: Not on file   Highest education level: Not on file  Occupational History   Not on file  Tobacco Use   Smoking status: Never   Smokeless tobacco: Never  Vaping Use   Vaping Use: Never used  Substance and Sexual Activity   Alcohol use: Yes    Comment: 10 drinks a year    Drug use: Never   Sexual activity: Yes  Partners: Male  Other Topics Concern   Not on file  Social History Narrative   Not on file   Social Determinants of Health   Financial Resource Strain: Not on file  Food Insecurity: Not on file  Transportation Needs: Not on file  Physical Activity: Not on file  Stress: Not on file  Social Connections: Not on file    Tobacco Counseling Counseling given: Not Answered   Clinical Intake:                 Diabetic?No         Activities of Daily Living In your present state of health, do you have any difficulty performing the following activities: 11/29/2020  Hearing? N  Vision? N  Difficulty concentrating or making decisions? N  Walking or climbing stairs? N  Dressing or bathing? N   Doing errands, shopping? N  Some recent data might be hidden    Patient Care Team: Leamon Arnt, MD as PCP - General (Family Medicine) Verl Dicker, MD as Referring Physician (Orthopedic Surgery)  Indicate any recent Medical Services you may have received from other than [facility] providers in the past year (date may be approximate).     Assessment:   This is a routine wellness examination for Shannon Osborne.  Hearing/Vision screen No results found.  Dietary issues and exercise activities discussed:     Goals Addressed   None    Depression Screen PHQ 2/9 Scores 11/29/2020 02/18/2020  PHQ - 2 Score 0 0  PHQ- 9 Score - 3    Fall Risk Fall Risk  11/29/2020 02/18/2020  Falls in the past year? 0 0  Comment - uses cain  Number falls in past yr: 0 0  Injury with Fall? 0 0  Risk for fall due to : - Impaired balance/gait    FALL RISK PREVENTION PERTAINING TO THE HOME:  Any stairs in or around the home? Yes  If so, are there any without handrails? No  Home free of loose throw rugs in walkways, pet beds, electrical cords, etc? Yes  Adequate lighting in your home to reduce risk of falls? Yes   ASSISTIVE DEVICES UTILIZED TO PREVENT FALLS:  Life alert? No  Use of a cane, walker or w/c? No  Grab bars in the bathroom? No  Shower chair or bench in shower? Yes  Elevated toilet seat or a handicapped toilet? No   TIMED UP AND GO:  Was the test performed? Yes .  Length of time to ambulate 10 feet: 10 sec.   Gait steady and fast without use of assistive device  Cognitive Function:        Immunizations Immunization History  Administered Date(s) Administered   Fluad Quad(high Dose 65+) 04/28/2020   Influenza Whole 05/22/2011   Influenza-Unspecified 06/26/2015   PFIZER(Purple Top)SARS-COV-2 Vaccination 10/03/2019, 10/28/2019, 05/28/2020   Pneumococcal Conjugate-13 03/17/2015   Pneumococcal Polysaccharide-23 01/14/2009, 11/29/2020   Zoster Recombinat (Shingrix) 04/05/2020      Flu Vaccine status: Up to date  Pneumococcal vaccine status: Up to date  Covid-19 vaccine status: Completed vaccines  Qualifies for Shingles Vaccine? Yes   Zostavax completed Yes   Shingrix Completed?: Yes  Screening Tests Health Maintenance  Topic Date Due   Zoster Vaccines- Shingrix (2 of 2) 05/31/2020   COVID-19 Vaccine (4 - Booster for Pfizer series) 08/28/2020   INFLUENZA VACCINE  03/21/2021   DEXA SCAN  01/27/2023   COLONOSCOPY (Pts 45-83yr Insurance coverage will need to be confirmed)  10/21/2024  Hepatitis C Screening  Completed   PNA vac Low Risk Adult  Completed   HPV VACCINES  Aged Out    Health Maintenance  Health Maintenance Due  Topic Date Due   Zoster Vaccines- Shingrix (2 of 2) 05/31/2020   COVID-19 Vaccine (4 - Booster for Pfizer series) 08/28/2020    Colorectal cancer screening: Type of screening: Colonoscopy. Completed 10/22/19. Repeat every 5 years    Bone Density status: Completed 01/26/21. Results reflect: Bone density results: OSTEOPENIA. Repeat every 2 years.   Additional Screening:  Hepatitis C Screening:  Completed 02/18/20  Vision Screening: Recommended annual ophthalmology exams for early detection of glaucoma and other disorders of the eye. Is the patient up to date with their annual eye exam?  Yes  Who is the provider or what is the name of the office in which the patient attends annual eye exams? [facility]  If pt is not established with a provider, would they like to be referred to a provider to establish care? No .   Dental Screening: Recommended annual dental exams for proper oral hygiene  Community Resource Referral / Chronic Care Management: CRR required this visit?  No   CCM required this visit?  No      Plan:     I have personally reviewed and noted the following in the patient's chart:   Medical and social history Use of alcohol, tobacco or illicit drugs  Current medications and supplements including opioid  prescriptions. Patient is not currently taking opioid prescriptions. Functional ability and status Nutritional status Physical activity Advanced directives List of other physicians Hospitalizations, surgeries, and ER visits in previous 12 months Vitals Screenings to include cognitive, depression, and falls Referrals and appointments  In addition, I have reviewed and discussed with patient certain preventive protocols, quality metrics, and best practice recommendations. A written personalized care plan for preventive services as well as general preventive health recommendations were provided to patient.     Willette Brace, LPN   [id number]   Nurse Notes: None

## 2021-04-18 ENCOUNTER — Ambulatory Visit: Payer: Medicare Other | Admitting: Family Medicine

## 2021-04-19 ENCOUNTER — Other Ambulatory Visit: Payer: Self-pay

## 2021-04-19 ENCOUNTER — Encounter: Payer: Self-pay | Admitting: Physician Assistant

## 2021-04-19 ENCOUNTER — Ambulatory Visit (INDEPENDENT_AMBULATORY_CARE_PROVIDER_SITE_OTHER): Payer: Medicare Other | Admitting: Physician Assistant

## 2021-04-19 VITALS — BP 144/82 | HR 72 | Temp 97.4°F | Ht 60.0 in | Wt 139.0 lb

## 2021-04-19 DIAGNOSIS — I10 Essential (primary) hypertension: Secondary | ICD-10-CM | POA: Diagnosis not present

## 2021-04-19 MED ORDER — LOSARTAN POTASSIUM 25 MG PO TABS: 25.0000 mg | ORAL_TABLET | Freq: Every day | ORAL | 0 refills | Status: DC

## 2021-04-19 NOTE — Progress Notes (Signed)
Acute Office Visit  Subjective:    Patient ID: Shannon Osborne, female    DOB: October 01, 1955, 65 y.o.   MRN: [id number]  Chief Complaint  Patient presents with   Hypertension    Hypertension  Patient is in today for blood pressure check. She says she feels great and has no symptoms. Previously had been tried on BP medication about 10-15 years ago, but states she had to quit it due to dropping too low. Salt in diet usually raises it too high. No hx of smoking, hardly any alcohol use.   Apple BP cuff readings from her phone:  Sat 04/09/21 - reading was 172/96 Last week's average was 123-172 / 26-97  Mother and all three brothers have issues with high blood pressure. We called mom today in office and she has been taking losartan and amlodipine without any issues.    Past Medical History:  Diagnosis Date   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   Breast cancer ([place]) 2009   BRACA 1 Pos    Chronic midline low back pain without sciatica 02/18/2020   GERD (gastroesophageal reflux disease) 02/18/2020   Hyperlipemia    Multiple drug allergies 02/18/2020   Myalgia due to statin 02/18/2020   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   MRI 05/2018   Osteopenia after menopause 02/11/2021   DEXA 01/2021 lowest T = -1.7 femur; recheck 2-3 years    Past Surgical History:  Procedure Laterality Date   ABDOMINAL HYSTERECTOMY  2002   BREAST SURGERY     right 09/10/2007, left 06/16/2009   FOOT BONE EXCISION     SHOULDER ARTHROTOMY Left 2013   and 06/2015   TONSILLECTOMY  2012    Family History  Problem Relation Age of Onset   Hypertension Mother    Colon polyps Mother    Gallstones Mother    Stroke Father    Hypertension Brother    Gallstones Brother    Hypertension Brother    Gallstones Brother    Hypertension Brother    Gallstones Brother     Social History   Socioeconomic History   Marital status: Single    Spouse name: Not on file   Number of children: Not on file   Years of  education: Not on file   Highest education level: Not on file  Occupational History   Not on file  Tobacco Use   Smoking status: Never   Smokeless tobacco: Never  Vaping Use   Vaping Use: Never used  Substance and Sexual Activity   Alcohol use: Yes    Comment: 10 drinks a year    Drug use: Never   Sexual activity: Yes    Partners: Male  Other Topics Concern   Not on file  Social History Narrative   Not on file   Social Determinants of Health   Financial Resource Strain: Low Risk    Difficulty of Paying Living Expenses: Not hard at all  Food Insecurity: No Food Insecurity   Worried About Charity fundraiser in the Last Year: Never true   Arboriculturist in the Last Year: Never true  Transportation Needs: No Transportation Needs   Lack of Transportation (Medical): No   Lack of Transportation (Non-Medical): No  Physical Activity: Sufficiently Active   Days of Exercise per Week: 6 days   Minutes of Exercise per Session: 60 min  Stress: No Stress Concern Present   Feeling of Stress : Not at all  Social Connections: Socially Isolated  Frequency of Communication with Friends and Family: More than three times a week   Frequency of Social Gatherings with Friends and Family: More than three times a week   Attends Religious Services: Never   Marine scientist or Organizations: No   Attends Music therapist: Never   Marital Status: Never married  Human resources officer Violence: Not At Risk   Fear of Current or Ex-Partner: No   Emotionally Abused: No   Physically Abused: No   Sexually Abused: No    Outpatient Medications Prior to Visit  Medication Sig Dispense Refill   aspirin EC 81 MG tablet Take 81 mg by mouth daily. Swallow whole.     carisoprodol (SOMA) 350 MG tablet Take 350 mg by mouth 3 (three) times daily. As needed     Coenzyme Q10 10 MG capsule Take by mouth.     diclofenac Sodium (VOLTAREN) 1 % GEL Apply topically 4 (four) times daily.      ergocalciferol (VITAMIN D2) 1.25 MG (50000 UT) capsule      ezetimibe (ZETIA) 10 MG tablet Take 1 tablet (10 mg total) by mouth at bedtime. 90 tablet 3   Fluticasone Propionate 0.05 % LOTN Apply topically.     gabapentin (NEURONTIN) 300 MG capsule Take by mouth daily.     melatonin 5 MG TABS Take 3 mg by mouth at bedtime as needed.     Pitavastatin Calcium (LIVALO) 4 MG TABS Take 1 tablet (4 mg total) by mouth daily. 90 tablet 3   Polyethylene Glycol 3350 (MIRALAX PO) Take by mouth.     Probiotic Product (PROBIOTIC + OMEGA-3 PO) Take 1 tablet by mouth daily.     traZODone (DESYREL) 50 MG tablet Take by mouth. As needed for sleep     omeprazole (PRILOSEC) 20 MG capsule TAKE 1 CAPSULE(20 MG) BY MOUTH TWICE DAILY BEFORE A MEAL 180 capsule 1   No facility-administered medications prior to visit.    Allergies  Allergen Reactions   Baclofen Other (See Comments)    insomnia   Cyclobenzaprine Other (See Comments)    Insomnia   Escitalopram Oxalate Other (See Comments)   Iodine Other (See Comments)   Rosuvastatin Other (See Comments)   Sertraline Other (See Comments)    "like taking caffeine"   Simvastatin Other (See Comments)   Atorvastatin Other (See Comments)    myalgia   Codeine Rash   Contrast Media [Iodinated Diagnostic Agents] Rash    Chest pressure    Hydrochlorothiazide W-Triamterene Rash   Hydrocodone-Acetaminophen Rash   Oxycodone Hcl Rash    Review of Systems REFER TO HPI FOR PERTINENT POSITIVES AND NEGATIVES     Objective:    Physical Exam Vitals and nursing note reviewed.  Constitutional:      Appearance: Normal appearance. She is normal weight. She is not toxic-appearing.  HENT:     Head: Normocephalic and atraumatic.     Right Ear: External ear normal.     Left Ear: External ear normal.     Nose: Nose normal.     Mouth/Throat:     Mouth: Mucous membranes are moist.  Eyes:     Extraocular Movements: Extraocular movements intact.     Conjunctiva/sclera:  Conjunctivae normal.     Pupils: Pupils are equal, round, and reactive to light.  Cardiovascular:     Rate and Rhythm: Normal rate and regular rhythm.     Pulses: Normal pulses.     Heart sounds: Normal heart sounds.  Pulmonary:  Effort: Pulmonary effort is normal.     Breath sounds: Normal breath sounds.  Musculoskeletal:        General: Normal range of motion.     Cervical back: Normal range of motion and neck supple.  Skin:    General: Skin is warm and dry.  Neurological:     General: No focal deficit present.     Mental Status: She is alert and oriented to person, place, and time.  Psychiatric:        Mood and Affect: Mood normal.        Behavior: Behavior normal.        Thought Content: Thought content normal.        Judgment: Judgment normal.    BP (!) 144/82   Pulse 72   Temp (!) 97.4 F (36.3 C)   Ht 5' (1.524 m)   Wt 139 lb (63 kg)   BMI 27.15 kg/m  Wt Readings from Last 3 Encounters:  04/19/21 139 lb (63 kg)  02/18/21 144 lb 12.8 oz (65.7 kg)  11/29/20 142 lb 9.6 oz (64.7 kg)    Health Maintenance Due  Topic Date Due   Zoster Vaccines- Shingrix (2 of 2) 05/31/2020   COVID-19 Vaccine (4 - Booster for Pfizer series) 08/28/2020   INFLUENZA VACCINE  03/21/2021    There are no preventive care reminders to display for this patient.   Lab Results  Component Value Date   TSH 1.61 02/18/2020   Lab Results  Component Value Date   WBC 7.2 11/29/2020   HGB 12.9 11/29/2020   HCT 39.1 11/29/2020   MCV 87.8 11/29/2020   PLT 279.0 11/29/2020   Lab Results  Component Value Date   NA 139 11/29/2020   K 4.2 11/29/2020   CO2 30 11/29/2020   GLUCOSE 82 11/29/2020   BUN 23 11/29/2020   CREATININE 0.65 11/29/2020   BILITOT 0.6 11/29/2020   ALKPHOS 85 11/29/2020   AST 16 11/29/2020   ALT 19 11/29/2020   PROT 6.8 11/29/2020   ALBUMIN 4.5 11/29/2020   CALCIUM 9.5 11/29/2020   GFR 91.42 11/29/2020   Lab Results  Component Value Date   CHOL 206 (H)  11/29/2020   Lab Results  Component Value Date   HDL 62.80 11/29/2020   Lab Results  Component Value Date   LDLCALC 111 (H) 11/29/2020   Lab Results  Component Value Date   TRIG 160.0 (H) 11/29/2020   Lab Results  Component Value Date   CHOLHDL 3 11/29/2020   No results found for: HGBA1C     Assessment & Plan:   Problem List Items Addressed This Visit   None   1. Essential hypertension -Average readings are above goal -Discussed several options, agree to start on Losartan 25 mg daily, possible SE discussed, as well as symptoms of hypotension to watch out for -Limit salt; increase cardio exercise and water -Monitor at home -She will recheck in 1 month   [place], PA-C

## 2021-04-19 NOTE — Patient Instructions (Addendum)
BP average is running high. Start on Losartan 25 mg daily. Continue to monitor at home. Goal BP 120-30s/ 70s-80s. Bring log of BP's to next appointment please. Avoid salt, drink plenty of water, exercise. Call sooner if any issues.

## 2021-05-12 ENCOUNTER — Encounter: Payer: Self-pay | Admitting: Physician Assistant

## 2021-05-17 ENCOUNTER — Ambulatory Visit (INDEPENDENT_AMBULATORY_CARE_PROVIDER_SITE_OTHER): Payer: Medicare Other | Admitting: Physician Assistant

## 2021-05-17 ENCOUNTER — Other Ambulatory Visit: Payer: Self-pay

## 2021-05-17 ENCOUNTER — Encounter: Payer: Self-pay | Admitting: Physician Assistant

## 2021-05-17 VITALS — BP 140/80 | HR 91 | Temp 98.0°F | Ht 59.75 in | Wt 139.5 lb

## 2021-05-17 DIAGNOSIS — I10 Essential (primary) hypertension: Secondary | ICD-10-CM

## 2021-05-17 DIAGNOSIS — M7989 Other specified soft tissue disorders: Secondary | ICD-10-CM | POA: Diagnosis not present

## 2021-05-17 DIAGNOSIS — Z853 Personal history of malignant neoplasm of breast: Secondary | ICD-10-CM

## 2021-05-17 MED ORDER — LOSARTAN POTASSIUM 25 MG PO TABS: 25.0000 mg | ORAL_TABLET | Freq: Every day | ORAL | 2 refills | Status: DC

## 2021-05-17 NOTE — Patient Instructions (Addendum)
Good to see you again.  Your BP is improving! Continue on current Losartan 25 mg daily. Continue to keep logging. Work on [employer] out and increasing walking.  Will also schedule for [place] right upper arm soft tissue.  I will let you know what Dr. Jonni Sanger says about your question today.  F/up in about 8 weeks with Dr. Jonni Sanger, sooner if needed.

## 2021-05-17 NOTE — Progress Notes (Signed)
Subjective:    Patient ID: Shannon Osborne, female    DOB: 07-29-1956, 65 y.o.   MRN: [id number]  Chief Complaint  Patient presents with   Hypertension    HPI Patient is in today for BP recheck. Previously had readings as high as 172/96.  Average for last 4 weeks is 138/84. She is taking Losartan 25 mg daily. No side effects. Says she has been on the road a lot & eating out often.  States that she wants to work harder on her diet and exercise over the next few weeks and see if this will continue to help her readings.  Patient also has a history of breast cancer and has a new mass on her right upper arm and would like this evaluated.  No other symptoms or concerns today.  Past Medical History:  Diagnosis Date   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   Breast cancer ([place]) 2009   BRACA 1 Pos    Chronic midline low back pain without sciatica 02/18/2020   GERD (gastroesophageal reflux disease) 02/18/2020   Hyperlipemia    Multiple drug allergies 02/18/2020   Myalgia due to statin 02/18/2020   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   MRI 05/2018   Osteopenia after menopause 02/11/2021   DEXA 01/2021 lowest T = -1.7 femur; recheck 2-3 years    Past Surgical History:  Procedure Laterality Date   ABDOMINAL HYSTERECTOMY  2002   BREAST SURGERY     right 09/10/2007, left 06/16/2009   FOOT BONE EXCISION     SHOULDER ARTHROTOMY Left 2013   and 06/2015   TONSILLECTOMY  2012    Family History  Problem Relation Age of Onset   Hypertension Mother    Colon polyps Mother    Gallstones Mother    Stroke Father    Hypertension Brother    Gallstones Brother    Hypertension Brother    Gallstones Brother    Hypertension Brother    Gallstones Brother     Social History   Tobacco Use   Smoking status: Never   Smokeless tobacco: Never  Vaping Use   Vaping Use: Never used  Substance Use Topics   Alcohol use: Yes    Comment: 10 drinks a year    Drug use: Never     Allergies  Allergen  Reactions   Baclofen Other (See Comments)    insomnia   Cyclobenzaprine Other (See Comments)    Insomnia   Escitalopram Oxalate Other (See Comments)   Iodine Other (See Comments)   Rosuvastatin Other (See Comments)   Sertraline Other (See Comments)    "like taking caffeine"   Simvastatin Other (See Comments)   Atorvastatin Other (See Comments)    myalgia   Codeine Rash   Contrast Media [Iodinated Diagnostic Agents] Rash    Chest pressure    Hydrochlorothiazide W-Triamterene Rash   Hydrocodone-Acetaminophen Rash   Oxycodone Hcl Rash    Review of Systems REFER TO HPI FOR PERTINENT POSITIVES AND NEGATIVES      Objective:     BP 118/74   Pulse 91   Temp 98 F (36.7 C)   Ht 4' 11.75" (1.518 m)   Wt 139 lb 8 oz (63.3 kg)   SpO2 96%   BMI 27.47 kg/m   Wt Readings from Last 3 Encounters:  05/17/21 139 lb 8 oz (63.3 kg)  04/19/21 139 lb (63 kg)  02/18/21 144 lb 12.8 oz (65.7 kg)    BP Readings from Last 3 Encounters:  05/17/21 118/74  04/19/21 (!) 144/82  02/18/21 138/76     Physical Exam Vitals and nursing note reviewed.  Constitutional:      Appearance: Normal appearance. She is normal weight. She is not toxic-appearing.  HENT:     Head: Normocephalic and atraumatic.     Right Ear: External ear normal.     Left Ear: External ear normal.     Nose: Nose normal.     Mouth/Throat:     Mouth: Mucous membranes are moist.  Eyes:     Extraocular Movements: Extraocular movements intact.     Conjunctiva/sclera: Conjunctivae normal.     Pupils: Pupils are equal, round, and reactive to light.  Cardiovascular:     Rate and Rhythm: Normal rate and regular rhythm.     Pulses: Normal pulses.     Heart sounds: Normal heart sounds.  Pulmonary:     Effort: Pulmonary effort is normal.     Breath sounds: Normal breath sounds.  Musculoskeletal:        General: Normal range of motion.     Cervical back: Normal range of motion and neck supple.  Skin:    General: Skin is  warm and dry.     Comments: Approximately 2 to 3 cm subcutaneous nontender mass right upper arm  Neurological:     General: No focal deficit present.     Mental Status: She is alert and oriented to person, place, and time.  Psychiatric:        Mood and Affect: Mood normal.        Behavior: Behavior normal.        Thought Content: Thought content normal.        Judgment: Judgment normal.       Assessment & Plan:   Problem List Items Addressed This Visit   None   1. Essential hypertension -Blood pressure is improving - She is going to continue losartan 25 mg daily and this was refilled for her - She is going to work on [employer] and cooking more at home. - She is also going to increase her walking.  2. Mass of soft tissue of upper arm 3. History of breast cancer -Possibly a lipoma, however with her breast cancer history, reasonable to get an ultrasound scheduled and happy to place this order for her.   This note was prepared with assistance of Systems analyst. Occasional wrong-word or sound-a-like substitutions may have occurred due to the inherent limitations of voice recognition software.  Time Spent: 27 minutes of total time was spent on the date of the encounter performing the following actions: chart review prior to seeing the patient, obtaining history, performing a medically necessary exam, counseling on the treatment plan, placing orders, and documenting in our EHR.    Nikita Humble M Mail[place], PA-C

## 2021-05-20 ENCOUNTER — Other Ambulatory Visit: Payer: Self-pay

## 2021-05-20 NOTE — Addendum Note (Signed)
Addended by: Loralyn Freshwater L on: 05/20/2021 09:30 AM   Modules accepted: Orders

## 2021-05-27 ENCOUNTER — Ambulatory Visit
Admission: RE | Admit: 2021-05-27 | Discharge: 2021-05-27 | Disposition: A | Discharge status: Home / Self Care | Payer: Medicare Other | Source: Ambulatory Visit | Attending: Physician Assistant | Admitting: Physician Assistant

## 2021-05-27 DIAGNOSIS — Z853 Personal history of malignant neoplasm of breast: Secondary | ICD-10-CM

## 2021-05-27 DIAGNOSIS — M7989 Other specified soft tissue disorders: Secondary | ICD-10-CM

## 2021-05-30 ENCOUNTER — Other Ambulatory Visit: Payer: Self-pay

## 2021-05-30 ENCOUNTER — Telehealth: Payer: Self-pay

## 2021-05-30 DIAGNOSIS — M7989 Other specified soft tissue disorders: Secondary | ICD-10-CM

## 2021-05-30 NOTE — Progress Notes (Signed)
Please call patient: I have reviewed his/her lab results. Her ultrasound shows the mass but it is not clearly a lipoma; needs further evaluation: options are MRI vs referral to surgeon for excision. I favor the surgical approach, but we could order an MRI to see if this is helpful if she prefers.  Please order which she prefers. Thanks.

## 2021-05-30 NOTE — Telephone Encounter (Signed)
Pt called regarding her MRI. Shannon Osborne stated that Dr Jonni Sanger told her to go see a surgeon regarding a fatty tumor. Shannon Osborne would like a call back to help her figure out what she should do. Please Advise.

## 2021-05-31 NOTE — Telephone Encounter (Signed)
Spoke with patient, surgery order has been placed

## 2021-06-26 ENCOUNTER — Other Ambulatory Visit: Payer: Self-pay

## 2021-06-26 ENCOUNTER — Emergency Department (HOSPITAL_BASED_OUTPATIENT_CLINIC_OR_DEPARTMENT_OTHER)
Admission: EM | Admit: 2021-06-26 | Discharge: 2021-06-26 | Disposition: A | Discharge status: Home / Self Care | Payer: Medicare Other | Attending: Emergency Medicine | Admitting: Emergency Medicine

## 2021-06-26 ENCOUNTER — Emergency Department (HOSPITAL_BASED_OUTPATIENT_CLINIC_OR_DEPARTMENT_OTHER): Payer: Medicare Other | Admitting: Radiology

## 2021-06-26 ENCOUNTER — Encounter (HOSPITAL_BASED_OUTPATIENT_CLINIC_OR_DEPARTMENT_OTHER): Payer: Self-pay

## 2021-06-26 DIAGNOSIS — R0789 Other chest pain: Secondary | ICD-10-CM | POA: Insufficient documentation

## 2021-06-26 DIAGNOSIS — Z853 Personal history of malignant neoplasm of breast: Secondary | ICD-10-CM | POA: Diagnosis not present

## 2021-06-26 DIAGNOSIS — R110 Nausea: Secondary | ICD-10-CM | POA: Diagnosis not present

## 2021-06-26 DIAGNOSIS — R059 Cough, unspecified: Secondary | ICD-10-CM | POA: Diagnosis not present

## 2021-06-26 DIAGNOSIS — Z7982 Long term (current) use of aspirin: Secondary | ICD-10-CM | POA: Insufficient documentation

## 2021-06-26 DIAGNOSIS — R799 Abnormal finding of blood chemistry, unspecified: Secondary | ICD-10-CM | POA: Diagnosis not present

## 2021-06-26 DIAGNOSIS — Z96612 Presence of left artificial shoulder joint: Secondary | ICD-10-CM | POA: Insufficient documentation

## 2021-06-26 DIAGNOSIS — Z20822 Contact with and (suspected) exposure to covid-19: Secondary | ICD-10-CM | POA: Diagnosis not present

## 2021-06-26 DIAGNOSIS — R0981 Nasal congestion: Secondary | ICD-10-CM | POA: Insufficient documentation

## 2021-06-26 DIAGNOSIS — R079 Chest pain, unspecified: Secondary | ICD-10-CM

## 2021-06-26 LAB — CBC
HCT: 39.4 % (ref 36.0–46.0)
Hemoglobin: 13.1 g/dL (ref 12.0–15.0)
MCH: 30.4 pg (ref 26.0–34.0)
MCHC: 33.2 g/dL (ref 30.0–36.0)
MCV: 91.4 fL (ref 80.0–100.0)
Platelets: 264 10*3/uL (ref 150–400)
RBC: 4.31 MIL/uL (ref 3.87–5.11)
RDW: 12.5 % (ref 11.5–15.5)
WBC: 5.7 10*3/uL (ref 4.0–10.5)
nRBC: 0 % (ref 0.0–0.2)

## 2021-06-26 LAB — BASIC METABOLIC PANEL
Anion gap: 9 (ref 5–15)
BUN: 11 mg/dL (ref 8–23)
CO2: 27 mmol/L (ref 22–32)
Calcium: 9.5 mg/dL (ref 8.9–10.3)
Chloride: 102 mmol/L (ref 98–111)
Creatinine, Ser: 0.64 mg/dL (ref 0.44–1.00)
GFR, Estimated: >60 mL/min (ref >60)
Glucose, Bld: 88 mg/dL (ref 70–99)
Potassium: 4 mmol/L (ref 3.5–5.1)
Sodium: 138 mmol/L (ref 135–145)

## 2021-06-26 LAB — RESP PANEL BY RT-PCR (FLU A&B, COVID) ARPGX2
Influenza A by PCR: NEGATIVE
Influenza B by PCR: NEGATIVE
SARS Coronavirus 2 by RT PCR: NEGATIVE

## 2021-06-26 LAB — CBG MONITORING, ED: Glucose-Capillary: 81 mg/dL (ref 70–99)

## 2021-06-26 LAB — TROPONIN I (HIGH SENSITIVITY)
Troponin I (High Sensitivity): 3 ng/L (ref <18)
Troponin I (High Sensitivity): 2 ng/L (ref <18)

## 2021-06-26 MED ORDER — PANTOPRAZOLE SODIUM 40 MG PO TBEC: 40.0000 mg | DELAYED_RELEASE_TABLET | Freq: Every day | ORAL | 0 refills | Status: DC

## 2021-06-26 NOTE — ED Triage Notes (Signed)
She states that she has had some vague palpitations, plus a "strange pressure" at left lat. Neck area x 4 days. She mentions recently ceasing the taking of gabapentin. She is in no distress. EKG performed at triage.

## 2021-06-26 NOTE — ED Notes (Signed)
Pt d/c home per MD order. Discharge summary reviewed with pt, pt verbalizes understanding. No s/s of acute distress noted at discharge. Pt voicing no complaints. Ambulatory off unit.

## 2021-06-26 NOTE — Discharge Instructions (Signed)
The test today in the ED were reassuring.  Take the antacid as prescribed.  Follow-up with your doctor to be rechecked

## 2021-06-26 NOTE — ED Provider Notes (Signed)
[facility] Provider Note   CSN: [id number] Arrival date & time: 06/26/21  1884     History   Shannon Osborne is a 65 y.o. female.  HPI  HPI: A 65 year old patient with a history of hypercholesterolemia presents for evaluation of chest pain. Initial onset of pain was more than 6 hours ago. The patient's chest pain is described as heaviness/pressure/tightness and is not worse with exertion. The patient complains of nausea. The patient's chest pain is middle- or left-sided, is not well-localized, is not sharp and does radiate to the arms/jaw/neck. The patient denies diaphoresis. The patient has a family history of coronary artery disease in a first-degree relative with onset less than age 58. The patient has no history of stroke, has no history of peripheral artery disease, has not smoked in the past 90 days, denies any history of treated diabetes, is not hypertensive and does not have an elevated BMI (>=30).  Patient states she initially started noticing symptoms a few days ago.  It occurred after she took some of her medications.  She had a pressure sensation in her chest and neck.  She also felt that her heart was beating irregularly.  She also checked it on her watch and it seemed to be regular.  Patient has had subsequent episodes.  Some episodes seem to be related to after eating or drinking.  She is not having any abdominal pain.  She has had some cough and congestion.  She is not having any body aches or fevers.  Patient does not have any history of heart disease. Past Medical History:  Diagnosis Date   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   Breast cancer ([place]) 2009   BRACA 1 Pos    Chronic midline low back pain without sciatica 02/18/2020   GERD (gastroesophageal reflux disease) 02/18/2020   Hyperlipemia    Multiple drug allergies 02/18/2020   Myalgia due to statin 02/18/2020   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   MRI 05/2018   Osteopenia after  menopause 02/11/2021   DEXA 01/2021 lowest T = -1.7 femur; recheck 2-3 years    Patient Active Problem List   Diagnosis Date Noted   Osteopenia after menopause 02/11/2021   Long-term current use of proton pump inhibitor therapy 11/29/2020   Postherpetic neuralgia 04/28/2020   History of breast cancer 02/18/2020   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   GERD (gastroesophageal reflux disease) 02/18/2020   Myalgia due to statin 02/18/2020   Mixed hyperlipidemia 02/18/2020   Colon polyp 02/18/2020   Multiple drug allergies 02/18/2020   Status post bilateral mastectomy 02/18/2020   Chronic midline low back pain without sciatica 02/18/2020   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     Family History  Problem Relation Age of Onset   Hypertension Mother    Colon polyps Mother    Gallstones Mother    Stroke Father    Hypertension Brother    Gallstones Brother    Hypertension Brother    Gallstones Brother  Hypertension Brother    Gallstones Brother     Social History   Tobacco Use   Smoking status: Never   Smokeless tobacco: Never  Vaping Use   Vaping Use: Never used  Substance Use Topics   Alcohol use: Yes    Comment: 10 drinks a year    Drug use: Never    Home Medications Prior to Admission medications   Medication Sig Start Date End Date Taking? Authorizing Provider  pantoprazole (PROTONIX) 40 MG tablet Take 1 tablet (40 mg total) by mouth daily for 14 days. 06/26/21 07/10/21 Yes Dorie Rank, MD  aspirin EC 81 MG tablet Take 81 mg by mouth  daily. Swallow whole.    [provider]  carisoprodol (SOMA) 350 MG tablet Take 350 mg by mouth 3 (three) times daily. As needed    [provider]  Coenzyme Q10 10 MG capsule Take by mouth.    [provider]  diclofenac Sodium (VOLTAREN) 1 % GEL Apply topically 4 (four) times daily.    [provider]  ergocalciferol (VITAMIN D2) 1.25 MG (50000 UT) capsule  10/26/11   [provider]  ezetimibe (ZETIA) 10 MG tablet Take 1 tablet (10 mg total) by mouth at bedtime. 09/16/20   Leamon Arnt, MD  Fluticasone Propionate 0.05 % LOTN Apply topically.    [provider]  gabapentin (NEURONTIN) 300 MG capsule Take by mouth daily. 06/24/20   [provider]  losartan (COZAAR) 25 MG tablet Take 1 tablet (25 mg total) by mouth daily. 05/17/21 06/16/21  Allwardt, Alyssa M, PA-C  melatonin 5 MG TABS Take 3 mg by mouth at bedtime as needed.    [provider]  Pitavastatin Calcium (LIVALO) 4 MG TABS Take 1 tablet (4 mg total) by mouth daily. 09/16/20   Leamon Arnt, MD  Polyethylene Glycol 3350 (MIRALAX PO) Take by mouth.    [provider]  Probiotic Product (PROBIOTIC + OMEGA-3 PO) Take 1 tablet by mouth daily.    [provider]  traZODone (DESYREL) 50 MG tablet Take by mouth. As needed for sleep    [provider]    Allergies    Baclofen, Cyclobenzaprine, Escitalopram oxalate, Iodine, Rosuvastatin, Sertraline, Simvastatin, Atorvastatin, Codeine, Contrast media [iodinated diagnostic agents], Hydrochlorothiazide w-triamterene, Hydrocodone-acetaminophen, and Oxycodone hcl  Review of Systems   Review of Systems  All other systems reviewed and are negative.  Physical Exam Updated Vital Signs BP (!) 148/79   Pulse 81   Temp 98.5 F (36.9 C)   Resp 16   Ht 1.518 m (4' 11.75")   Wt 61.7 kg   SpO2 99%   BMI 26.78 kg/m   Physical Exam Vitals and nursing note reviewed.  Constitutional:      General:  She is not in acute distress.    Appearance: She is well-developed.  HENT:     Head: Normocephalic and atraumatic.     Right Ear: External ear normal.     Left Ear: External ear normal.  Eyes:     General: No scleral icterus.       Right eye: No discharge.        Left eye: No discharge.     Conjunctiva/sclera: Conjunctivae normal.  Neck:     Trachea: No tracheal deviation.  Cardiovascular:     Rate and Rhythm: Normal rate and regular rhythm.  Pulmonary:     Effort: Pulmonary effort is normal. No respiratory distress.     Breath sounds: Normal  breath sounds. No stridor. No wheezing or rales.  Abdominal:     General: Bowel sounds are normal. There is no distension.     Palpations: Abdomen is soft.     Tenderness: There is no abdominal tenderness. There is no guarding or rebound.  Musculoskeletal:        General: No tenderness or deformity.     Cervical back: Neck supple.  Skin:    General: Skin is warm and dry.     Findings: No rash.  Neurological:     General: No focal deficit present.     Mental Status: She is alert.     Cranial Nerves: No cranial nerve deficit (no facial droop, extraocular movements intact, no slurred speech).     Sensory: No sensory deficit.     Motor: No abnormal muscle tone or seizure activity.     Coordination: Coordination normal.  Psychiatric:        Mood and Affect: Mood normal.    ED Results / Procedures / Treatments   Labs (all labs ordered are listed, but only abnormal results are displayed) Labs Reviewed  RESP PANEL BY RT-PCR (FLU A&B, COVID) ARPGX2  BASIC METABOLIC PANEL  CBC  CBG MONITORING, ED  TROPONIN I (HIGH SENSITIVITY)  TROPONIN I (HIGH SENSITIVITY)    EKG EKG Interpretation  Date/Time:  'Sunday June 26 2021 10:09:21 EST Ventricular Rate:  84 PR Interval:  142 QRS Duration: 72 QT Interval:  364 QTC Calculation: 430 R Axis:   59 Text Interpretation: Normal sinus rhythm Normal ECG No significant change since last  tracing Confirmed by Smiley Birr ([id number]) on 06/26/2021 10:18:58 AM  Radiology DG Chest 2 View  Result Date: 06/26/2021 CLINICAL DATA:  Chest pain and palpitations. EXAM: CHEST - 2 VIEW COMPARISON:  None. FINDINGS: The heart size and mediastinal contours are within normal limits. Aortic atherosclerotic calcification noted. Both lungs are clear. Left shoulder prosthesis noted. IMPRESSION: No active cardiopulmonary disease. Electronically Signed   By: John A Stahl M.D.   On: 06/26/2021 11:31    Procedures Procedures   Medications Ordered in ED Medications - No data to display  ED Course  I have reviewed the triage vital signs and the nursing notes.  Pertinent labs & imaging results that were available during my care of the patient were reviewed by me and considered in my medical decision making (see chart for details).    MDM Rules/Calculators/A&P HEAR Score: 5                         Patient presented to the ED for evaluation of chest pain.  Moderate risk heart score.  ED work-up is reassuring.  Serial troponins are normal.  I doubt symptoms are related to acute coronary syndrome.  No pneumonia on x-ray.  Symptoms not suggestive of PE.  No signs of influenza or COVID.  Patient has had some symptoms after swallowing.  She does have history of GERD.  Possible the symptoms are gastroesophageal in etiology.  We will try course of antacids.  Recommend close follow-up with PCP.  Warning signs precautions discussed Final Clinical Impression(s) / ED Diagnoses Final diagnoses:  Chest pain, unspecified type    Rx / DC Orders ED Discharge Orders          Ordered    pantoprazole (PROTONIX) 40 MG tablet  Daily        11' /06/22 1426  Dorie Rank, MD 06/26/21 [phone]

## 2021-07-07 ENCOUNTER — Ambulatory Visit (INDEPENDENT_AMBULATORY_CARE_PROVIDER_SITE_OTHER): Payer: Medicare Other | Admitting: Family Medicine

## 2021-07-07 ENCOUNTER — Other Ambulatory Visit: Payer: Self-pay

## 2021-07-07 ENCOUNTER — Encounter: Payer: Self-pay | Admitting: Family Medicine

## 2021-07-07 VITALS — BP 126/78 | HR 87 | Temp 98.2°F | Ht 60.0 in | Wt 139.2 lb

## 2021-07-07 DIAGNOSIS — I10 Essential (primary) hypertension: Secondary | ICD-10-CM | POA: Diagnosis not present

## 2021-07-07 DIAGNOSIS — H6523 Chronic serous otitis media, bilateral: Secondary | ICD-10-CM

## 2021-07-07 DIAGNOSIS — H9313 Tinnitus, bilateral: Secondary | ICD-10-CM | POA: Diagnosis not present

## 2021-07-07 DIAGNOSIS — Z1509 Genetic susceptibility to other malignant neoplasm: Secondary | ICD-10-CM

## 2021-07-07 DIAGNOSIS — Z1501 Genetic susceptibility to malignant neoplasm of breast: Secondary | ICD-10-CM | POA: Diagnosis not present

## 2021-07-07 DIAGNOSIS — R978 Other abnormal tumor markers: Secondary | ICD-10-CM

## 2021-07-07 DIAGNOSIS — K219 Gastro-esophageal reflux disease without esophagitis: Secondary | ICD-10-CM

## 2021-07-07 MED ORDER — OMEPRAZOLE 20 MG PO CPDR: 20.0000 mg | DELAYED_RELEASE_CAPSULE | Freq: Every day | ORAL | 3 refills | Status: DC

## 2021-07-07 MED ORDER — LOSARTAN POTASSIUM 50 MG PO TABS: 50.0000 mg | ORAL_TABLET | Freq: Every day | ORAL | 3 refills | Status: DC

## 2021-07-07 NOTE — Progress Notes (Signed)
Subjective  CC:  Chief Complaint  Patient presents with   Hypertension    Averaging between 135's/140's    HPI: Shannon Osborne is a 65 y.o. female who presents to the office today to address the problems listed above in the chief complaint. Hypertension f/u: Here for follow-up of hypertension after medication adjustment.  She was seen by Alyssa twice over the last couple months.  I reviewed both notes.  Blood pressure was high with home monitoring and losartan 25 was initiated. Fortunately bps are improved.  Control is fair . Pt reports she is doing well. taking medications as instructed, no medication side effects noted, no TIAs, no chest pain on exertion, no dyspnea on exertion, no swelling of ankles. Home readings still above goal slightly. Had ED visit for cp: likely GI related. I reviewed notes. She denies adverse effects from his BP medications. Compliance with medication is good.  Strong FH of breast and ovarain cancer: prior onc recommends annual ca125 screens.  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 Was getting annual chest xrays GERD: on protonix because active again but feels omeprazole worked better.  C/o ringing in both ears. No balance concerns. No known hearing concerns. Some pnd  Assessment  1. Essential hypertension   2. BRCA1 gene mutation positive   3. Tinnitus of both ears   4. Bilateral chronic serous otitis media   5. Other abnormal tumor markers   6. Gastroesophageal reflux disease without esophagitis      Plan   Hypertension f/u: BP control is fairly well controlled. Increase losartan to 50 daily. Continue home monitoring.  Fh of cancer and BRCA1 + f/u: screen for ovarian cancer per onc recs with annual ca125 Asbestosis exposure. Recent chest xray ok. Check screening recs. ? Lung ct annually Tinnitus. Nl hearing screen. No cerumen. Start zyrtec and flonase. Consider abx given serous effusions.  GERD: change back to daily omeprazole.  Education regarding management of these  chronic disease states was given. Management strategies discussed on successive visits include dietary and exercise recommendations, goals of achieving and maintaining IBW, and lifestyle modifications aiming for adequate sleep and minimizing stressors.   Follow up: April 2023 for cpe and bp f/u  Orders Placed This Encounter  Procedures   CA 125    Meds ordered this encounter  Medications   losartan (COZAAR) 50 MG tablet    Sig: Take 1 tablet (50 mg total) by mouth daily.    Dispense:  90 tablet    Refill:  3   omeprazole (PRILOSEC) 20 MG capsule    Sig: Take 1 capsule (20 mg total) by mouth daily.    Dispense:  90 capsule    Refill:  3       BP Readings from Last 3 Encounters:  07/07/21 126/78  06/26/21 (!) 148/79  05/17/21 140/80   Wt Readings from Last 3 Encounters:  07/07/21 139 lb 3.2 oz (63.1 kg)  06/26/21 136 lb (61.7 kg)  05/17/21 139 lb 8 oz (63.3 kg)    Lab Results  Component Value Date   CHOL 206 (H) 11/29/2020   CHOL 170 02/18/2020   CHOL 208 (A) 07/07/2019   Lab Results  Component Value Date   HDL 62.80 11/29/2020   HDL 49.90 02/18/2020   HDL 61 07/07/2019   Lab Results  Component Value Date   LDLCALC 111 (H) 11/29/2020   LDLCALC 117 07/07/2019   Lab Results  Component Value Date   TRIG 160.0 (H) 11/29/2020   TRIG 231.0 (H)  02/18/2020   TRIG 150 07/07/2019   Lab Results  Component Value Date   CHOLHDL 3 11/29/2020   CHOLHDL 3 02/18/2020   Lab Results  Component Value Date   LDLDIRECT 85.0 02/18/2020   Lab Results  Component Value Date   CREATININE 0.64 06/26/2021   BUN 11 06/26/2021   NA 138 06/26/2021   K 4.0 06/26/2021   CL 102 06/26/2021   CO2 27 06/26/2021    The 10-year ASCVD risk score (Arnett DK, et al., 2019) is: 8.7%   Values used to calculate the score:     Age: 38 years     Sex: Female     Is Non-Hispanic African American: No     Diabetic: No     Tobacco smoker: No     Systolic Blood Pressure: 097 mmHg     Is  BP treated: Yes     HDL Cholesterol: 62.8 mg/dL     Total Cholesterol: 206 mg/dL  I reviewed the patients updated PMH, FH, and SocHx.    Patient Active Problem List   Diagnosis Date Noted   Essential hypertension 07/07/2021    Priority: High   History of breast cancer 02/18/2020    Priority: High   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    Priority: High   Myalgia due to statin 02/18/2020    Priority: High   Mixed hyperlipidemia 02/18/2020    Priority: High   Multiple drug allergies 02/18/2020    Priority: High   Osteopenia after menopause 02/11/2021    Priority: Medium    Long-term current use of proton pump inhibitor therapy 11/29/2020    Priority: Medium    Postherpetic neuralgia 04/28/2020    Priority: Medium    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    Priority: Medium    GERD (gastroesophageal reflux disease) 02/18/2020    Priority: Medium    Colon polyp 02/18/2020    Priority: Medium    Chronic midline low back pain without sciatica 02/18/2020    Priority: Medium    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 She has never used smokeless tobacco. She reports current alcohol use. She reports that she does not use drugs.  Current Meds  Medication Sig   aspirin EC 81 MG tablet Take 81 mg by mouth daily. Swallow whole.   carisoprodol (SOMA) 350 MG tablet Take 350 mg by mouth 3 (three) times daily. As needed   Coenzyme Q10 10 MG  capsule Take by mouth.   diclofenac Sodium (VOLTAREN) 1 % GEL Apply topically 4 (four) times daily.   ergocalciferol (VITAMIN D2) 1.25 MG (50000 UT) capsule    ezetimibe (ZETIA) 10 MG tablet Take 1 tablet (10 mg total) by mouth at bedtime.   Fluticasone Propionate 0.05 % LOTN Apply topically.   gabapentin (NEURONTIN) 300 MG capsule Take by mouth daily.   melatonin 5 MG TABS Take 3 mg by mouth at bedtime as needed.   omeprazole (PRILOSEC) 20 MG capsule Take 1 capsule (20 mg total) by mouth daily.  Pitavastatin Calcium (LIVALO) 4 MG TABS Take 1 tablet (4 mg total) by mouth daily.   Polyethylene Glycol 3350 (MIRALAX PO) Take by mouth.   Probiotic Product (PROBIOTIC + OMEGA-3 PO) Take 1 tablet by mouth daily.   traZODone (DESYREL) 50 MG tablet Take by mouth. As needed for sleep   [DISCONTINUED] pantoprazole (PROTONIX) 40 MG tablet Take 1 tablet (40 mg total) by mouth daily for 14 days.    Review of Systems: Cardiovascular: negative for chest pain, palpitations, leg swelling, orthopnea Respiratory: negative for SOB, wheezing or persistent cough Gastrointestinal: negative for abdominal pain Genitourinary: negative for dysuria or gross hematuria  Objective  Vitals: BP 126/78   Pulse 87   Temp 98.2 F (36.8 C) (Temporal)   Ht 5' (1.524 m)   Wt 139 lb 3.2 oz (63.1 kg)   SpO2 96%   BMI 27.19 kg/m  General: no acute distress  Psych:  Alert and oriented, normal mood and affect HEENT:  Normocephalic, atraumatic, supple neck , bilateral serous effusions Cardiovascular:  RRR without murmur. no edema Respiratory:  Good breath sounds bilaterally, CTAB with normal respiratory effort Skin:  Warm, no rashes Neurologic:   Mental status is normal Commons side effects, risks, benefits, and alternatives for medications and treatment plan prescribed today were discussed, and the patient expressed understanding of the given instructions. Patient is instructed to call or message via MyChart if he/she has  any questions or concerns regarding our treatment plan. No barriers to understanding were identified. We discussed Red Flag symptoms and signs in detail. Patient expressed understanding regarding what to do in case of urgent or emergency type symptoms.  Medication list was reconciled, printed and provided to the patient in AVS. Patient instructions and summary information was reviewed with the patient as documented in the AVS. This note was prepared with assistance of Dragon voice recognition software. Occasional wrong-word or sound-a-like substitutions may have occurred due to the inherent limitations of voice recognition software  This visit occurred during the SARS-CoV-2 public health emergency.  Safety protocols were in place, including screening questions prior to the visit, additional usage of staff PPE, and extensive cleaning of exam room while observing appropriate contact time as indicated for disinfecting solutions.

## 2021-07-07 NOTE — Patient Instructions (Addendum)
Please return in April 2023 for your annual complete physical; please come fasting.   We are increasing the dose of your blood pressure pill. Start flonase nasal spray daily and otc zyrtec nightly.  Your hearing screen is normal.  If after 2-4 weeks, the ringing in your ears has not improved, let me know. I would add an antibiotic.  I will release your lab results to you on your MyChart account with further instructions. Please reply with any questions.    If you have any questions or concerns, please don't hesitate to send me a message via MyChart or call the office at [phone]. Thank you for visiting with [place] today! It's our pleasure caring for you.   Tinnitus Tinnitus refers to hearing a sound when there is no actual source for that sound. This is often described as ringing in the ears. However, people with this condition may hear a variety of noises, in one ear or in both ears. The sounds of tinnitus can be soft, loud, or somewhere in between. Tinnitus can last for a few seconds or can be constant for days. It may go away without treatment and come back at various times. When tinnitus is constant or happens often, it can lead to other problems, such as trouble sleeping and trouble concentrating. Almost everyone experiences tinnitus at some point. Tinnitus is not the same as hearing loss. Tinnitus that is long-lasting (chronic) or comes back often (recurs) may require medical attention. What are the causes? The cause of tinnitus is often not known. In some cases, it can result from: Exposure to loud noises from machinery, music, or other sources. An object (foreign body) stuck in the ear. Earwax buildup. Drinking alcohol or caffeine. Taking certain medicines. Age-related hearing loss. It may also be caused by medical conditions such as: Ear or sinus infections. Heart diseases or high blood pressure. Allergies. Mnire's disease. Thyroid problems. Tumors. A weak, bulging blood  vessel (aneurysm) near the ear. What increases the risk? The following factors may make you more likely to develop this condition: Exposure to loud noises. Age. Tinnitus is more likely in older individuals. Using alcohol or tobacco. What are the signs or symptoms? The main symptom of tinnitus is hearing a sound when there is no source for that sound. It may sound like: Buzzing. Sizzling. Ringing. Blowing air. Hissing. Whistling. Other sounds may include: Roaring. Running water. A musical note. Tapping. Humming. Symptoms may affect only one ear (unilateral) or both ears (bilateral). How is this diagnosed? Tinnitus is diagnosed based on your symptoms, your medical history, and a physical exam. Your health care provider may do a thorough hearing test (audiologic exam) if your tinnitus: Is unilateral. Causes hearing difficulties. Lasts 6 months or longer. You may work with a health care provider who specializes in hearing disorders (audiologist). You may be asked questions about your symptoms and how they affect your daily life. You may have other tests done, such as: CT scan. MRI. An imaging test of how blood flows through your blood vessels (angiogram). How is this treated? Treating an underlying medical condition can sometimes make tinnitus go away. If your tinnitus continues, other treatments may include: Therapy and counseling to help you manage the stress of living with tinnitus. Sound generators to mask the tinnitus. These include: Tabletop sound machines that play relaxing sounds to help you fall asleep. Wearable devices that fit in your ear and play sounds or music. Acoustic neural stimulation. This involves using headphones to listen to music  that contains an auditory signal. Over time, listening to this signal may change some pathways in your brain and make you less sensitive to tinnitus. This treatment is used for very severe cases when no other treatment is  working. Using hearing aids or cochlear implants if your tinnitus is related to hearing loss. Hearing aids are worn in the outer ear. Cochlear implants are surgically placed in the inner ear. Follow these instructions at home: Managing symptoms   When possible, avoid being in loud places and being exposed to loud sounds. Wear hearing protection, such as earplugs, when you are exposed to loud noises. Use a white noise machine, a humidifier, or other devices to mask the sound of tinnitus. Practice techniques for reducing stress, such as meditation, yoga, or deep breathing. Work with your health care provider if you need help with managing stress. Sleep with your head slightly raised. This may reduce the impact of tinnitus. General instructions Do not use stimulants, such as nicotine, alcohol, or caffeine. Talk with your health care provider about other stimulants to avoid. Stimulants are substances that can make you feel alert and attentive by increasing certain activities in the body (such as heart rate and blood pressure). These substances may make tinnitus worse. Take over-the-counter and prescription medicines only as told by your health care provider. Try to get plenty of sleep each night. Keep all follow-up visits. This is important. Contact a health care provider if: Your tinnitus continues for 3 weeks or longer without stopping. You develop sudden hearing loss. Your symptoms get worse or do not get better with home care. You feel you are not able to manage the stress of living with tinnitus. Get help right away if: You develop tinnitus after a head injury. You have tinnitus along with any of the following: Dizziness. Nausea and vomiting. Loss of balance. Sudden, severe headache. Vision changes. Facial weakness or weakness of arms or legs. These symptoms may represent a serious problem that is an emergency. Do not wait to see if the symptoms will go away. Get medical help right  away. Call your local emergency services (911 in the [place] Do not drive yourself to the hospital. Summary Tinnitus refers to hearing a sound when there is no actual source for that sound. This is often described as ringing in the ears. Symptoms may affect only one ear (unilateral) or both ears (bilateral). Use a white noise machine, a humidifier, or other devices to mask the sound of tinnitus. Do not use stimulants, such as nicotine, alcohol, or caffeine. These substances may make tinnitus worse. This information is not intended to replace advice given to you by your health care provider. Make sure you discuss any questions you have with your health care provider. Document Revised: 07/12/2020 Document Reviewed: 07/12/2020 Elsevier Patient Education  2022 [employer].

## 2021-07-08 LAB — CA 125: CA 125: 4 U/mL (ref <35)

## 2021-07-26 ENCOUNTER — Encounter: Payer: Self-pay | Admitting: Family Medicine

## 2021-07-28 ENCOUNTER — Other Ambulatory Visit: Payer: Self-pay

## 2021-07-28 DIAGNOSIS — H9313 Tinnitus, bilateral: Secondary | ICD-10-CM

## 2021-08-03 DIAGNOSIS — H938X1 Other specified disorders of right ear: Secondary | ICD-10-CM | POA: Insufficient documentation

## 2021-08-19 ENCOUNTER — Encounter: Payer: Self-pay | Admitting: Family Medicine

## 2021-08-29 ENCOUNTER — Encounter: Payer: Self-pay | Admitting: Family Medicine

## 2021-08-29 ENCOUNTER — Ambulatory Visit (INDEPENDENT_AMBULATORY_CARE_PROVIDER_SITE_OTHER): Payer: Medicare Other | Admitting: Family Medicine

## 2021-08-29 ENCOUNTER — Other Ambulatory Visit: Payer: Self-pay

## 2021-08-29 VITALS — BP 164/92 | HR 98 | Temp 98.5°F | Ht 60.0 in | Wt 142.6 lb

## 2021-08-29 DIAGNOSIS — Z889 Allergy status to unspecified drugs, medicaments and biological substances status: Secondary | ICD-10-CM | POA: Diagnosis not present

## 2021-08-29 DIAGNOSIS — H9311 Tinnitus, right ear: Secondary | ICD-10-CM | POA: Diagnosis not present

## 2021-08-29 DIAGNOSIS — I10 Essential (primary) hypertension: Secondary | ICD-10-CM

## 2021-08-29 DIAGNOSIS — M791 Myalgia, unspecified site: Secondary | ICD-10-CM | POA: Diagnosis not present

## 2021-08-29 DIAGNOSIS — T466X5A Adverse effect of antihyperlipidemic and antiarteriosclerotic drugs, initial encounter: Secondary | ICD-10-CM

## 2021-08-29 DIAGNOSIS — G9001 Carotid sinus syncope: Secondary | ICD-10-CM

## 2021-08-29 DIAGNOSIS — M792 Neuralgia and neuritis, unspecified: Secondary | ICD-10-CM

## 2021-08-29 DIAGNOSIS — R6884 Jaw pain: Secondary | ICD-10-CM

## 2021-08-29 DIAGNOSIS — R519 Headache, unspecified: Secondary | ICD-10-CM

## 2021-08-29 MED ORDER — METOPROLOL SUCCINATE ER 25 MG PO TB24: 25.0000 mg | ORAL_TABLET | Freq: Every day | ORAL | 3 refills | Status: DC

## 2021-08-29 NOTE — Patient Instructions (Signed)
Please follow up as scheduled for your next visit with me: 12/06/2021   If you have any questions or concerns, please don't hesitate to send me a message via MyChart or call the office at [phone]. Thank you for visiting with [place] today! It's our pleasure caring for you.

## 2021-08-29 NOTE — Progress Notes (Signed)
Subjective  CC:  Chief Complaint  Patient presents with   Ear Pain    Right, From temporal down jaw.   Hypertension    HPI: Shannon Osborne is a 66 y.o. female who presents to the office today to address the problems listed above in the chief complaint. Complicated course: 66 year old who reports onset of tinnitus about 4 to 6 months ago.  However also complains of right ear and face pain.  She reports that she has a constant ache that starts in front of her ear and goes down to the edge of her right jaw.  She also has some neck pain.  No redness, warmth or masses palpated.  She does admit to postnasal drainage.  She has been on Zyrtec and Flonase for ear fullness and tinnitus but feels this is made her drainage worse.  She denies thick purulent drainage but admits to occasional right sided facial pain.  She denies temporal pain or pain with chewing.  No fevers or chills.  No visual problems.  She did see ENT last month and had a normal ENT exam.  She is scheduled for audiology evaluation to further evaluate her tinnitus.  She is very bothered by her symptoms.  She has not used any medications outside of Zyrtec and Flonase.  At times she describes the pain as aching and other times it feels very warm.  She denies history of TMJ problems.  No dental pain. Hypertension: She is on losartan.  Tinnitus started at the time of starting the losartan.  She would like to change blood pressure medications.  She stopped losartan about 1 week ago. Leg pain/cramps: Possibly related to statin.  She has stopped Livalo and is seeing if anxiety is contributing to pain.  Assessment  1. Tinnitus, right   2. Carotidynia   3. Myalgia due to statin   4. Multiple drug allergies   5. Essential hypertension   6. Jaw pain   7. Neuralgia   8. Facial pain      Plan  Tinnitus: Normal exam.  Await audiology eval and ENT further recommendations. Right facial pain, ear pain and neck pain: Broad differential.  Trigeminal  neuralgia, acoustic neuralgia, carotidynia, TMJ or other problems were discussed.  We will check sinus CT to ensure no chronic sinusitis symptoms given drainage issues.  Start Advil for carotidynia pain and or TMJ symptoms.  Recommend dental evaluation and x-rays..  Refer to neurology for further evaluations for neuralgias.  Exam is fairly normal except for tenderness over the carotid bulb.  No masses are palpated.  Reassured. Hypertension: Stop losartan, monitor tinnitus symptoms and trial of Toprol-XL 25 mg daily.  Follow up: As scheduled 12/06/2021  Orders Placed This Encounter  Procedures   CT Maxillofacial WO CM   Ambulatory referral to Neurology   Meds ordered this encounter  Medications   metoprolol succinate (TOPROL-XL) 25 MG 24 hr tablet    Sig: Take 1 tablet (25 mg total) by mouth daily.    Dispense:  90 tablet    Refill:  3      I reviewed the patients updated PMH, FH, and SocHx.    Patient Active Problem List   Diagnosis Date Noted   Essential hypertension 07/07/2021    Priority: High   History of breast cancer 02/18/2020    Priority: High   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    Priority: High   Myalgia due to statin 02/18/2020    Priority: High   Mixed  hyperlipidemia 02/18/2020    Priority: High   Multiple drug allergies 02/18/2020    Priority: High   Osteopenia after menopause 02/11/2021    Priority: Medium    Long-term current use of proton pump inhibitor therapy 11/29/2020    Priority: Medium    Postherpetic neuralgia 04/28/2020    Priority: Medium    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    Priority: Medium    GERD (gastroesophageal reflux disease) 02/18/2020    Priority: Medium    Colon polyp 02/18/2020    Priority: Medium    Chronic midline low back pain without sciatica 02/18/2020    Priority: Medium    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    Priority: Medium    Post-traumatic arthritis of right foot 01/16/2020    Priority: Medium     Status post bilateral mastectomy 02/18/2020    Priority: Low   Status post shoulder replacement, 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    Priority: Low   Current Meds  Medication Sig   aspirin EC 81 MG tablet Take 81 mg by mouth daily. Swallow whole.   carisoprodol (SOMA) 350 MG tablet Take 350 mg by mouth 3 (three) times daily. As needed   Coenzyme Q10 10 MG capsule Take by mouth.   diclofenac Sodium (VOLTAREN) 1 % GEL Apply topically 4 (four) times daily.   ergocalciferol (VITAMIN D2) 1.25 MG (50000 UT) capsule    ezetimibe (ZETIA) 10 MG tablet Take 1 tablet (10 mg total) by mouth at bedtime.   Fluticasone Propionate 0.05 % LOTN Apply topically.   melatonin 5 MG TABS Take 3 mg by mouth at bedtime as needed.   metoprolol succinate (TOPROL-XL) 25 MG 24 hr tablet Take 1 tablet (25 mg total) by mouth daily.   omeprazole (PRILOSEC) 20 MG capsule Take 1 capsule (20 mg total) by mouth daily.   Polyethylene Glycol 3350 (MIRALAX PO) Take by mouth.   Probiotic Product (PROBIOTIC + OMEGA-3 PO) Take 1 tablet by mouth daily.   traZODone (DESYREL) 50 MG tablet Take by mouth. As needed for sleep   [DISCONTINUED] losartan (COZAAR) 50 MG tablet Take 1 tablet (50 mg total) by mouth daily.    Allergies: Patient is allergic to baclofen, cyclobenzaprine, escitalopram oxalate, iodine, rosuvastatin, sertraline, simvastatin, atorvastatin, codeine, contrast media [iodinated contrast media], hydrochlorothiazide w-triamterene, hydrocodone-acetaminophen, and oxycodone hcl. Family History: Patient family history includes Colon polyps in her mother; Gallstones in her brother, brother, brother, and mother; Hypertension in her brother, brother, brother, and mother; Stroke in her father. Social History:  Patient  reports that she has never smoked. She has never used smokeless tobacco. She reports current alcohol use. She reports that she does not use drugs.  Review of Systems: Constitutional: Negative for fever malaise or  anorexia Cardiovascular: negative for chest pain Respiratory: negative for SOB or persistent cough Gastrointestinal: negative for abdominal pain  Objective  Vitals: BP (!) 164/92    Pulse 98    Temp 98.5 F (36.9 C) (Temporal)    Ht 5' (1.524 m)    Wt 142 lb 9.6 oz (64.7 kg)    SpO2 99%    BMI 27.85 kg/m  General: no acute distress , A&Ox3 HEENT: PEERL, conjunctiva normal, neck is supple, no neck masses, TMJs nontender with some subluxation bilaterally, right carotid bulb is tender and reproduces pain.  No lymphadenopathy.  Normal TMs bilaterally.  Nontender temporal arteries bilaterally Cardiovascular:  RRR without murmur or gallop.  Respiratory:  Good breath sounds bilaterally, CTAB with normal respiratory effort Skin:  Warm, no rashes    Commons side effects, risks, benefits, and alternatives for medications and treatment plan prescribed today were discussed, and the patient expressed understanding of the given instructions. Patient is instructed to call or message via MyChart if he/she has any questions or concerns regarding our treatment plan. No barriers to understanding were identified. We discussed Red Flag symptoms and signs in detail. Patient expressed understanding regarding what to do in case of urgent or emergency type symptoms.  Medication list was reconciled, printed and provided to the patient in AVS. Patient instructions and summary information was reviewed with the patient as documented in the AVS. This note was prepared with assistance of Dragon voice recognition software. Occasional wrong-word or sound-a-like substitutions may have occurred due to the inherent limitations of voice recognition software  This visit occurred during the SARS-CoV-2 public health emergency.  Safety protocols were in place, including screening questions prior to the visit, additional usage of staff PPE, and extensive cleaning of exam room while observing appropriate contact time as indicated for  disinfecting solutions.

## 2021-08-30 ENCOUNTER — Encounter: Payer: Self-pay | Admitting: Neurology

## 2021-08-30 ENCOUNTER — Encounter: Payer: Self-pay | Admitting: Family Medicine

## 2021-08-31 ENCOUNTER — Encounter: Payer: Self-pay | Admitting: Family Medicine

## 2021-08-31 ENCOUNTER — Other Ambulatory Visit: Payer: Self-pay

## 2021-08-31 DIAGNOSIS — M79604 Pain in right leg: Secondary | ICD-10-CM

## 2021-09-01 NOTE — Telephone Encounter (Signed)
See message and refer to her requested cardiologist. thanks

## 2021-09-02 ENCOUNTER — Other Ambulatory Visit: Payer: Self-pay

## 2021-09-02 DIAGNOSIS — I700 Atherosclerosis of aorta: Secondary | ICD-10-CM

## 2021-09-07 NOTE — Telephone Encounter (Signed)
Spoke to [place], was able to get patient scheduled in [place] for Monday, 09/12/2021 at [facility]. 101 per patient preference to have an appointment ASAP, had patient on one line and [facility] on the other.

## 2021-09-12 ENCOUNTER — Ambulatory Visit
Admission: RE | Admit: 2021-09-12 | Discharge: 2021-09-12 | Disposition: A | Discharge status: Home / Self Care | Payer: Medicare Other | Source: Ambulatory Visit | Attending: Family Medicine | Admitting: Family Medicine

## 2021-09-12 ENCOUNTER — Other Ambulatory Visit: Payer: Self-pay | Admitting: Family Medicine

## 2021-09-12 DIAGNOSIS — R519 Headache, unspecified: Secondary | ICD-10-CM

## 2021-09-12 DIAGNOSIS — M792 Neuralgia and neuritis, unspecified: Secondary | ICD-10-CM

## 2021-09-12 DIAGNOSIS — H9311 Tinnitus, right ear: Secondary | ICD-10-CM

## 2021-09-26 ENCOUNTER — Encounter: Payer: Self-pay | Admitting: Family Medicine

## 2021-09-26 MED ORDER — CARISOPRODOL 350 MG PO TABS: 350.0000 mg | ORAL_TABLET | Freq: Three times a day (TID) | ORAL | 1 refills | Status: DC | PRN

## 2021-09-28 NOTE — Progress Notes (Signed)
Cardiology Office Note:    Date:  10/03/2021   ID:  Shannon Osborne, DOB [birth year], MRN [id number]  PCP:  Shannon Arnt, MD   [facility] Providers Cardiologist:  Shannon Bergeron, MD {   Referring MD: Shannon Arnt, MD     History of Present Illness:    Shannon Osborne is a 66 y.o. female with a hx of breast cancer (Shannon 1 positive) s/p chemo, HLD, GERD and HTN who was referred for aortic atherosclerosis.  Today, the patient states she underwent CT scan for the left shoulder and was found to have atherosclerosis of the aorta, left subclavian artery and coronary arteries. She has had a prior coronary calcium score in 09/2014 which showed Ca score 353 which was 90% for age, gender, race matched controls. Has not tolerated statins (has trialed simvastatin, crestor, lipitor, livalo). LDL 111.   Otherwise, the patient states she has a history of chemotherapy for her breast cancer which caused significant tendonopathies. Required surgical fix of her right foot. She was sedentary for a long period of time and became very deconditioned in that setting. She has slowly increased her exercise and is now able to make it 2 miles but has to stop some due to neuropathy. No exertional chest pain or SOB at this time. No significant LE edema, orthopnea, lightheadedness or dizziness.   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 Swallow whole.   carisoprodol (SOMA) 350 MG tablet Take 1 tablet (350 mg total) by mouth 3 (three) times daily as needed for muscle spasms.   Coenzyme Q10 10 MG capsule Take by mouth.   diclofenac Sodium (VOLTAREN) 1 % GEL Apply topically 4 (four) times daily.   ergocalciferol (VITAMIN D2) 1.25 MG (50000 UT) capsule    ezetimibe (ZETIA) 10 MG tablet Take 1 tablet (10 mg total) by mouth at bedtime.   Fluticasone Propionate 0.05 % LOTN Apply topically.   gabapentin (NEURONTIN) 300 MG capsule Take by mouth daily.   losartan (COZAAR) 25 MG tablet Take 25 mg by mouth daily. In the evening   melatonin 3 MG TABS tablet Take 3 mg by mouth at bedtime as needed.   metoprolol succinate (TOPROL-XL) 25 MG 24 hr tablet Take 25 mg by mouth every morning.   omeprazole (PRILOSEC) 20 MG capsule Take 1 capsule (20 mg total) by mouth daily.   Polyethylene Glycol 3350 (MIRALAX PO) Take by mouth.   Probiotic Product (PROBIOTIC + OMEGA-3 PO) Take 1 tablet by mouth daily.   traZODone (DESYREL) 50 MG tablet Take by mouth. As needed for sleep  Allergies:   Baclofen, Cyclobenzaprine, Escitalopram oxalate, Iodine, Rosuvastatin, Sertraline, Simvastatin, Atorvastatin, Codeine, Contrast media [iodinated contrast media], Hydrochlorothiazide w-triamterene, Hydrocodone-acetaminophen, and Oxycodone hcl   Social History   Socioeconomic History   Marital status: Single    Spouse name: Not on file   Number of children: Not on file   Years of education: Not on file   Highest education level: Not on file  Occupational History   Not on file  Tobacco Use   Smoking status: Never   Smokeless tobacco: Never  Vaping Use   Vaping Use: Never used  Substance and Sexual Activity   Alcohol use: Yes    Comment: 10 drinks a year     Drug use: Never   Sexual activity: Yes    Partners: Male  Other Topics Concern   Not on file  Social History Narrative   Not on file   Social Determinants of Health   Financial Resource Strain: Low Risk    Difficulty of Paying Living Expenses: Not hard at all  Food Insecurity: No Food Insecurity   Worried About Charity fundraiser in the Last Year: Never true   Ran Out of Food in the Last Year: Never true  Transportation Needs: No Transportation Needs   Lack of Transportation (Medical): No   Lack of Transportation (Non-Medical): No  Physical Activity: Sufficiently Active   Days of Exercise per Week: 6 days   Minutes of Exercise per Session: 60 min  Stress: No Stress Concern Present   Feeling of Stress : Not at all  Social Connections: Socially Isolated   Frequency of Communication with Friends and Family: More than three times a week   Frequency of Social Gatherings with Friends and Family: More than three times a week   Attends Religious Services: Never   Marine scientist or Organizations: No   Attends Music therapist: Never   Marital Status: Never married     Family History: The patient's family history includes Colon polyps in her mother; Gallstones in her brother, brother, brother, and mother; Hypertension in her brother, brother, brother, and mother; Stroke in her father.  ROS:   Please see the history of present illness.    Review of Systems  Constitutional:  Negative for chills and fever.  Respiratory:  Negative for shortness of breath.   Cardiovascular:  Negative for chest pain, palpitations, orthopnea, claudication, leg swelling and PND.  Gastrointestinal:  Negative for nausea and vomiting.  Musculoskeletal:  Positive for joint pain and myalgias.  Neurological:  Negative for dizziness and loss of consciousness.    EKGs/Labs/Other Studies Reviewed:    The following studies were reviewed today: CT shoulder  08/2020: FINDINGS: Bones/Joint/Cartilage   Status post left total shoulder arthroplasty. The hardware appears intact without loosening. No evidence of acute fracture, dislocation or significant narrowing of the subacromial space.   The shoulder joint is well distended with contrast. A small amount of contrast extends anteriorly into the deltoid muscle along the needle tract. As seen on the post injection images, there is a small amount of contrast anterolateral to the greater tuberosity, suspicious for a small full-thickness tear of the distal supraspinatus tendon.   Ligaments   Ligaments are suboptimally evaluated by CT.   Muscles and Tendons As above, possible small full-thickness insertional tear of the supraspinatus tendon anteriorly. Mild atrophy of the supraspinatus and subscapularis muscles.   Soft Tissues Diffuse atherosclerosis of the aorta, great vessels and coronary arteries. There is dense calcification  near the origin of the left subclavian artery. The visualized left lung is clear. The left shoulder periarticular soft tissues appear unremarkable.   IMPRESSION: 1. Possible small full-thickness insertional tear of the distal supraspinatus tendon anteriorly. Mild atrophy of the supraspinatus and subscapularis muscles. 2. Status post left total shoulder arthroplasty without hardware loosening or acute osseous findings. 3. Aortic Atherosclerosis (ICD10-I70.0).  EKG:  EKG 06/221: NSR with no ischemic changes  Recent Labs: 11/29/2020: ALT 19 06/26/2021: BUN 11; Creatinine, Ser 0.64; Hemoglobin 13.1; Platelets 264; Potassium 4.0; Sodium 138  Recent Lipid Panel    Component Value Date/Time   CHOL 206 (H) 11/29/2020 0923   TRIG 160.0 (H) 11/29/2020 0923   HDL 62.80 11/29/2020 0923   CHOLHDL 3 11/29/2020 0923   VLDL 32.0 11/29/2020 0923   LDLCALC 111 (H) 11/29/2020 0923   LDLDIRECT 85.0 02/18/2020 1354          Physical Exam:    VS:  BP 130/76    Pulse 71     Ht 5' (1.524 m)    Wt 139 lb (63 kg)    SpO2 98%    BMI 27.15 kg/m     Wt Readings from Last 3 Encounters:  10/03/21 139 lb (63 kg)  08/29/21 142 lb 9.6 oz (64.7 kg)  07/07/21 139 lb 3.2 oz (63.1 kg)     GEN:  Well nourished, well developed in no acute distress HEENT: Normal NECK: No JVD; No carotid bruits CARDIAC: RRR, no murmurs, rubs, gallops RESPIRATORY:  Clear to auscultation without rales, wheezing or rhonchi  ABDOMEN: Soft, non-tender, non-distended MUSCULOSKELETAL:  No edema; No deformity  SKIN: Warm and dry NEUROLOGIC:  Alert and oriented x 3 PSYCHIATRIC:  Normal affect   ASSESSMENT:    1. CAD in native artery   2. Mixed hyperlipidemia   3. Statin intolerance   4. Primary hypertension   5. History of breast cancer    PLAN:    In order of problems listed above:  #Coronary Artery Disease: #Aortic Atherosclerosis: Ca score 353 in 09/2014 mainly in LAD with small amount in Lcx. Recent CT of shoulder with atherosclerosis of the aorta, coronaries and great vessels. No anginal symptoms. No history of stroke. Patient is active without significant symptoms. -Continue ASA 44m daily -Intolerant of statins; will look into PCSK9i -Continue zetia 131mdaily -Goal LDL<70 -Continue healthy diet and exercise  #HLD: #Statin Intolerance: LDL 111. Intolerant to statins (has trialed simvastatin, crestor, lipitor, livalo). -Refer to lipid clinic for PCSKi -Continue zetia 1018maily  #HTN: -Continue metop 49m82m daily  #Breast Cancer s/p chemo: -Check TTE with strain     Medication Adjustments/Labs and Tests Ordered: Current medicines are reviewed at length with the patient today.  Concerns regarding medicines are outlined above.  Orders Placed This Encounter  Procedures   AMB Referral to [facility]   ECHOCARDIOGRAM COMPLETE   No orders of the defined types were placed in this encounter.   Patient Instructions  Medication Instructions:   Your physician recommends that you continue on your current medications as directed. Please refer to the Current Medication list given to you today.  *If you need a refill on your cardiac medications before your next appointment, please call your pharmacy*   Lab Work: None ordered  If you have labs (blood work) drawn today and your tests are completely normal, you will receive your results only by: [facility] you have MyChart) OR A paper copy in the mail If  you have any lab test that is abnormal or we need to change your treatment, we will call you to review the results.   Testing/Procedures: Your physician has requested that you have an echocardiogram. Echocardiography is a painless test that uses sound waves to create images of your heart. It provides your doctor with information about the size and shape of your heart and how well your hearts chambers and valves are working. This procedure takes approximately one hour. There are no restrictions for this procedure.   You have been referred to LIPID CLINIC    Follow-Up: At [facility], you and your health needs are our priority.  As part of our continuing mission to provide you with exceptional heart care, we have created designated Provider Care Teams.  These Care Teams include your primary Cardiologist (physician) and Advanced Practice Providers (APPs -  Physician Assistants and Nurse Practitioners) who all work together to provide you with the care you need, when you need it.  We recommend signing up for the patient portal called "MyChart".  Sign up information is provided on this After Visit Summary.  MyChart is used to connect with patients for Virtual Visits (Telemedicine).  Patients are able to view lab/test results, encounter notes, upcoming appointments, etc.  Non-urgent messages can be sent to your provider as well.   To learn more about what you can do with MyChart, go to [email or web address].    Your next  appointment:   12 month(s)  The format for your next appointment:   In Person  Provider:   Freada Bergeron, MD    Other Instructions    Signed, Shannon Bergeron, MD  10/03/2021 3:34 PM    [place]

## 2021-09-30 ENCOUNTER — Encounter: Payer: Self-pay | Admitting: Family Medicine

## 2021-10-03 ENCOUNTER — Ambulatory Visit (INDEPENDENT_AMBULATORY_CARE_PROVIDER_SITE_OTHER): Payer: Medicare Other | Admitting: Cardiology

## 2021-10-03 ENCOUNTER — Encounter: Payer: Self-pay | Admitting: Cardiology

## 2021-10-03 ENCOUNTER — Other Ambulatory Visit: Payer: Self-pay

## 2021-10-03 VITALS — BP 130/76 | HR 71 | Ht 60.0 in | Wt 139.0 lb

## 2021-10-03 DIAGNOSIS — Z789 Other specified health status: Secondary | ICD-10-CM

## 2021-10-03 DIAGNOSIS — Z853 Personal history of malignant neoplasm of breast: Secondary | ICD-10-CM

## 2021-10-03 DIAGNOSIS — I10 Essential (primary) hypertension: Secondary | ICD-10-CM

## 2021-10-03 DIAGNOSIS — E782 Mixed hyperlipidemia: Secondary | ICD-10-CM | POA: Diagnosis not present

## 2021-10-03 DIAGNOSIS — I2510 Atherosclerotic heart disease of native coronary artery without angina pectoris: Secondary | ICD-10-CM | POA: Diagnosis not present

## 2021-10-03 NOTE — Patient Instructions (Signed)
Medication Instructions:  Your physician recommends that you continue on your current medications as directed. Please refer to the Current Medication list given to you today.  *If you need a refill on your cardiac medications before your next appointment, please call your pharmacy*   Lab Work: None ordered  If you have labs (blood work) drawn today and your tests are completely normal, you will receive your results only by: [place] (if you have MyChart) OR A paper copy in the mail If you have any lab test that is abnormal or we need to change your treatment, we will call you to review the results.   Testing/Procedures: Your physician has requested that you have an echocardiogram. Echocardiography is a painless test that uses sound waves to create images of your heart. It provides your doctor with information about the size and shape of your heart and how well your hearts chambers and valves are working. This procedure takes approximately one hour. There are no restrictions for this procedure.   You have been referred to LIPID CLINIC    Follow-Up: At [facility], you and your health needs are our priority.  As part of our continuing mission to provide you with exceptional heart care, we have created designated Provider Care Teams.  These Care Teams include your primary Cardiologist (physician) and Advanced Practice Providers (APPs -  Physician Assistants and Nurse Practitioners) who all work together to provide you with the care you need, when you need it.  We recommend signing up for the patient portal called "MyChart".  Sign up information is provided on this After Visit Summary.  MyChart is used to connect with patients for Virtual Visits (Telemedicine).  Patients are able to view lab/test results, encounter notes, upcoming appointments, etc.  Non-urgent messages can be sent to your provider as well.   To learn more about what you can do with MyChart, go to  [email or web address].    Your next appointment:   12 month(s)  The format for your next appointment:   In Person  Provider:   Freada Bergeron, MD    Other Instructions

## 2021-10-04 NOTE — Progress Notes (Signed)
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  I do not suspect primary neurologic etiology such as trigeminal neuralgia.  Continue use of mouth guard Follow up with dentist as advised   Subjective:  Shannon Osborne is a 66 year old female with CAD, HTN, HLD, osteoarthritis of lumbar spine and history of breast cancer who presents for facial neuralgia.  History supplemented by referring provider's note.  In October, she began experiencing a dull ache in her right ear.  There was some associated pain along the right side of her jaw line.  She also noted ringing in the ear.  With jaw movement, she would hear and feel popping in the ear.  No hearing loss, numbness or paroxysmal shooting pain or sensitivity to light touch.  She reports nasal congestion and often needs to breath in hard through her nose.  She also noted postnasal drainage for which she treated with Flonase and Zyrtec without improvement.  She saw an allergist and tested positive for grass and pet dander.  She saw ENT and had a normal exam.  CT of sinuses on 09/12/2020 was clear.  She saw her dentist who didn't note any visible evidence of TMJ but didn't rule it out and recommended wearing a bite guard and has seen improvement.  She also started treating with carisoprodol which helps.    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 Swallow whole.     carisoprodol (SOMA) 350 MG tablet Take 1 tablet (350 mg total) by mouth 3 (three) times daily as needed for muscle spasms. 60 tablet 1   Coenzyme Q10 10 MG capsule Take by mouth.     diclofenac Sodium (VOLTAREN) 1 % GEL Apply topically 4 (four) times daily.     ergocalciferol (VITAMIN D2) 1.25 MG (50000 UT) capsule      ezetimibe (ZETIA) 10 MG tablet Take 1 tablet (10 mg total) by mouth at bedtime. 90 tablet 3   Fluticasone Propionate 0.05 % LOTN Apply topically.     gabapentin (NEURONTIN) 300 MG capsule Take by mouth daily.     losartan (COZAAR) 25 MG tablet Take 25 mg by mouth daily. In the evening     melatonin 3 MG TABS tablet Take 3 mg by mouth at bedtime as needed.     metoprolol  succinate (TOPROL-XL) 25 MG 24 hr tablet Take 25 mg by mouth every morning.     omeprazole (PRILOSEC) 20 MG capsule Take 1 capsule (20 mg total) by mouth daily. 90 capsule 3   Polyethylene Glycol 3350 (MIRALAX PO) Take by mouth.     Probiotic Product (PROBIOTIC + OMEGA-3 PO) Take 1 tablet by mouth daily.     traZODone (DESYREL) 50 MG tablet Take by mouth. As needed for sleep     No current facility-administered medications on file prior to visit.    ALLERGIES: Allergies  Allergen Reactions   Baclofen Other (See Comments)    insomnia   Cyclobenzaprine Other (See Comments)    Insomnia   Escitalopram Oxalate Other (See Comments)   Iodine Other (See Comments)   Rosuvastatin Other (See  Comments)   Sertraline Other (See Comments)    "like taking caffeine"   Simvastatin Other (See Comments)   Atorvastatin Other (See Comments)    myalgia   Codeine Rash   Contrast Media [Iodinated Contrast Media] Rash    Chest pressure    Hydrochlorothiazide W-Triamterene Rash   Hydrocodone-Acetaminophen Rash   Oxycodone Hcl Rash    FAMILY HISTORY: Family History  Problem Relation Age of Onset   Hypertension Mother    Colon polyps Mother    Gallstones Mother    Stroke Father    Hypertension Brother    Gallstones Brother    Hypertension Brother    Gallstones Brother    Hypertension Brother    Gallstones Brother     Objective:  Blood pressure (!) 151/83, pulse 86, height 4' 11" (1.499 m), weight 140 lb (63.5 kg), SpO2 99 %. General: No acute distress.  Patient appears well-groomed.   Head:  Normocephalic/atraumatic.  Tenderness to palpation of the right TMJ and posterior lower teeth on the right.  No crepitus.   Eyes:  fundi examined but not visualized Neurological Exam: Mental status: alert and oriented to person, place, and time, peech fluent and not dysarthric, language intact. Cranial nerves: CN I: not tested CN II: pupils equal, round and reactive to light, visual fields intact CN III, IV, VI:  full range of motion, no nystagmus, no ptosis CN V: facial sensation intact. CN VII: upper and lower face symmetric CN VIII: hearing intact CN IX, X: gag intact, uvula midline CN XI: sternocleidomastoid and trapezius muscles intact CN XII: tongue midline Bulk & Tone: normal, no fasciculations. Motor:  muscle strength 5/5 throughout Sensation:  Pinprick, temperature and vibratory sensation intact. Deep Tendon Reflexes:  2+ throughout,  toes downgoing.   Finger to nose testing:  Without dysmetria.   Heel to shin:  Without dysmetria.   Gait:  Normal station and stride.  Romberg negative.    Thank you for allowing me to take part in the care of this patient.  Metta Clines,  DO  CC: Billey Chang, MD

## 2021-10-05 ENCOUNTER — Other Ambulatory Visit: Payer: Self-pay | Admitting: Family Medicine

## 2021-10-06 ENCOUNTER — Encounter: Payer: Self-pay | Admitting: Neurology

## 2021-10-06 ENCOUNTER — Other Ambulatory Visit: Payer: Self-pay

## 2021-10-06 ENCOUNTER — Ambulatory Visit (INDEPENDENT_AMBULATORY_CARE_PROVIDER_SITE_OTHER): Payer: Medicare Other | Admitting: Neurology

## 2021-10-06 VITALS — BP 151/83 | HR 86 | Ht 59.0 in | Wt 140.0 lb

## 2021-10-06 DIAGNOSIS — M26609 Unspecified temporomandibular joint disorder, unspecified side: Secondary | ICD-10-CM | POA: Diagnosis not present

## 2021-10-06 DIAGNOSIS — I2510 Atherosclerotic heart disease of native coronary artery without angina pectoris: Secondary | ICD-10-CM | POA: Diagnosis not present

## 2021-10-06 NOTE — Patient Instructions (Signed)
I agree that this is TMJ dysfunction Recommend continued use of mouth guard and follow up with dentist as scheduled.

## 2021-10-07 DIAGNOSIS — H9201 Otalgia, right ear: Secondary | ICD-10-CM | POA: Insufficient documentation

## 2021-10-07 DIAGNOSIS — J342 Deviated nasal septum: Secondary | ICD-10-CM | POA: Insufficient documentation

## 2021-10-07 DIAGNOSIS — G514 Facial myokymia: Secondary | ICD-10-CM | POA: Insufficient documentation

## 2021-10-07 DIAGNOSIS — M26609 Unspecified temporomandibular joint disorder, unspecified side: Secondary | ICD-10-CM | POA: Insufficient documentation

## 2021-10-07 DIAGNOSIS — R0981 Nasal congestion: Secondary | ICD-10-CM | POA: Insufficient documentation

## 2021-10-07 DIAGNOSIS — R2689 Other abnormalities of gait and mobility: Secondary | ICD-10-CM | POA: Insufficient documentation

## 2021-10-07 DIAGNOSIS — J343 Hypertrophy of nasal turbinates: Secondary | ICD-10-CM | POA: Insufficient documentation

## 2021-10-12 ENCOUNTER — Telehealth: Payer: Self-pay | Admitting: Hematology and Oncology

## 2021-10-12 NOTE — Telephone Encounter (Signed)
Scheduled appt per 2/20 referral. Pt is aware of appt date and time. Pt is aware to arrive 15 mins prior to appt time and to bring and updated insurance card. Pt is aware of appt location.   °

## 2021-10-27 ENCOUNTER — Other Ambulatory Visit: Payer: Self-pay

## 2021-10-27 ENCOUNTER — Ambulatory Visit (INDEPENDENT_AMBULATORY_CARE_PROVIDER_SITE_OTHER): Payer: Medicare Other | Admitting: Pharmacist

## 2021-10-27 ENCOUNTER — Ambulatory Visit (HOSPITAL_COMMUNITY): Payer: Medicare Other | Attending: Internal Medicine

## 2021-10-27 DIAGNOSIS — I2510 Atherosclerotic heart disease of native coronary artery without angina pectoris: Secondary | ICD-10-CM | POA: Diagnosis present

## 2021-10-27 DIAGNOSIS — E782 Mixed hyperlipidemia: Secondary | ICD-10-CM

## 2021-10-27 LAB — LIPID PANEL
Chol/HDL Ratio: 6.3 ratio — ABNORMAL HIGH (ref 0.0–4.4)
Cholesterol, Total: 342 mg/dL — ABNORMAL HIGH (ref 100–199)
HDL: 54 mg/dL (ref >39)
LDL Chol Calc (NIH): 207 mg/dL — ABNORMAL HIGH (ref 0–99)
Triglycerides: 391 mg/dL — ABNORMAL HIGH (ref 0–149)
VLDL Cholesterol Cal: 81 mg/dL — ABNORMAL HIGH (ref 5–40)

## 2021-10-27 LAB — LDL CHOLESTEROL, DIRECT: LDL Direct: 238 mg/dL — ABNORMAL HIGH (ref 0–99)

## 2021-10-27 LAB — ECHOCARDIOGRAM COMPLETE
Area-P 1/2: 3.32 cm2
S' Lateral: 2.1 cm

## 2021-10-27 NOTE — Patient Instructions (Signed)
Your LDL cholesterol was 111 last year and your goal is < 70 ? ?We'll recheck your baseline today ? ?Continue taking your medications ? ?I will submit information to your insurance for Repatha and let you know when I hear back.  ?  ?Repatha is a subcutaneous injection given once every 2 weeks in the fatty tissue of your stomach or upper outer thigh. Store the medication in the fridge. You can let your dose warm up to room temperature for 30 minutes before injecting if you prefer. Repatha will lower your LDL cholesterol by 60% and helps to lower your chance of having a heart attack or stroke. ? ? ?

## 2021-10-27 NOTE — Progress Notes (Signed)
Patient ID: Shannon Osborne                 DOB: September 01, 1955                    MRN: [id number] ? ? ? ? ?HPI: ?Shannon Osborne is a 66 y.o. female patient referred to lipid clinic by Dr Johney Frame. PMH is significant for HLD, HTN, breast cancer s/p chemo, left shoulder CT scan that found atherosclerosis of the aorta, left subclavian artery and coronary arteries. Prior CAC 09/2014 showed elevated calcium score of 353 (90% for age, gender and race matched controls). ? ?Pt presents today for follow up. Reports taking cholesterol medications for the past almost 30 years. Prior MD in [place] initially prescribed her a few different statins including simvastatin, atorvastatin, and rosuvastatin (doses unknown). She would tolerate the statins for 3-5 years before developing bilateral muscle aches/cramps, particularly in her thighs. Reports onset was sooner with rosuvastatin and had myalgias after ~6 months of therapy. Has been in [place] for the past 2 years, her PCP here started her on Livalo and Zetia. She developed cramping and stopped both meds, then was encouraged to resume her Zetia. Has not had recurrent cramping on this. Lipid panel last year reflects pt on both Livalo and Zetia. Not fasting today (had egg frittata, cauliflower bread, and 8 oz [employer]). ? ?Current Medications: ezetimibe 60m daily ?Intolerances: simvastatin, rosuvastatin, atorvastatin, pitavastatin 414mdaily - leg cramps ?Risk Factors: elevated CAC, aortic atherosclerosis ?LDL goal: <7030mL ? ?Family History: Colon polyps in her mother; Gallstones in her brother, brother, brother, and mother; Hypertension in her brother, brother, brother, and mother; Stroke in her father. ? ?Social History: No tobacco use, occasional alcohol use. ? ?Labs: ?11/29/20: TC 206, TG 160, HDL 62.8, LDL 111 (ezetimibe 43m24mily, Livalo 4mg 56mly) ? ?Past Medical History:  ?Diagnosis Date  ? 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  ? Breast cancer (HCC) [place]9  ? BRACA 1 Pos   ?  Chronic midline low back pain without sciatica 02/18/2020  ? GERD (gastroesophageal reflux disease) 02/18/2020  ? Hyperlipemia   ? Multiple drug allergies 02/18/2020  ? Myalgia due to statin 02/18/2020  ? 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  ? MRI 05/2018  ? Osteopenia after menopause 02/11/2021  ? DEXA 01/2021 lowest T = -1.7 femur; recheck 2-3 years  ? ? ?Current Outpatient Medications on File Prior to Visit  ?Medication Sig Dispense Refill  ? aspirin EC 81 MG tablet Take 81 mg by mouth daily. Swallow whole.    ? carisoprodol (SOMA) 350 MG tablet Take 1 tablet (350 mg total) by mouth 3 (three) times daily as needed for muscle spasms. 60 tablet 1  ? diclofenac Sodium (VOLTAREN) 1 % GEL Apply topically 4 (four) times daily.    ? ergocalciferol (VITAMIN D2) 1.25 MG (50000 UT) capsule     ? ezetimibe (ZETIA) 10 MG tablet TAKE 1 TABLET AT BEDTIME 90 tablet 3  ? Fluticasone Propionate 0.05 % LOTN Apply topically.    ? gabapentin (NEURONTIN) 300 MG capsule Take by mouth daily.    ? losartan (COZAAR) 25 MG tablet Take 25 mg by mouth daily. In the evening    ? melatonin 3 MG TABS tablet Take 3 mg by mouth at bedtime as needed.    ? metoprolol succinate (TOPROL-XL) 25 MG 24 hr tablet Take 25 mg by mouth every morning.    ? omeprazole (PRILOSEC) 20 MG capsule Take 1 capsule (20 mg  total) by mouth daily. 90 capsule 3  ? Polyethylene Glycol 3350 (MIRALAX PO) Take by mouth.    ? Probiotic Product (PROBIOTIC + OMEGA-3 PO) Take 1 tablet by mouth daily.    ? traZODone (DESYREL) 50 MG tablet Take by mouth. As needed for sleep    ? ?No current facility-administered medications on file prior to visit.  ? ? ?Allergies  ?Allergen Reactions  ? Baclofen Other (See Comments)  ?  insomnia  ? Cyclobenzaprine Other (See Comments)  ?  Insomnia  ? Escitalopram Oxalate Other (See Comments)  ? Iodine Other (See Comments)  ? Rosuvastatin Other (See Comments)  ? Sertraline Other (See Comments)  ?  "like taking caffeine"  ? Simvastatin Other (See  Comments)  ? Atorvastatin Other (See Comments)  ?  myalgia  ? Codeine Rash  ? Contrast Media [Iodinated Contrast Media] Rash  ?  Chest pressure   ? Hydrochlorothiazide W-Triamterene Rash  ? Hydrocodone-Acetaminophen Rash  ? Oxycodone Hcl Rash  ? ? ?Assessment/Plan: ? ?1. Hyperlipidemia - LDL 111 above goal < 70 given elevated CAC and aortic atherosclerosis. LDL reflects pt taking Livalo and Zetia, however she has since stopped Livalo secondary to myalgias. Will repeat updated baseline labs today. Discussed addition of PCSK9i therapy given prior intolerance to at least 4 statins. Will submit prior authorization for Repatha and follow up with pt once approved. She'll qualify for $5 copay card but will need to call in to Rep[facility] to activate since she's > 65. Sees her PCP next month and advised she can have lipids rechecked there as long as she's given at least 3 Repatha injections by then. ? ?Kashon Kraynak E. Lindel Marcell, PharmD, BCACP, CPP ?[place] [facility] [place], [place] [address] ?Phone: [phone]; Fax: [phone] ?10/27/2021 11:04 AM ? ? ? ?

## 2021-11-01 ENCOUNTER — Emergency Department (HOSPITAL_BASED_OUTPATIENT_CLINIC_OR_DEPARTMENT_OTHER)
Admission: EM | Admit: 2021-11-01 | Discharge: 2021-11-01 | Disposition: A | Discharge status: Home / Self Care | Payer: Medicare Other | Attending: Emergency Medicine | Admitting: Emergency Medicine

## 2021-11-01 ENCOUNTER — Other Ambulatory Visit: Payer: Self-pay

## 2021-11-01 ENCOUNTER — Encounter (HOSPITAL_BASED_OUTPATIENT_CLINIC_OR_DEPARTMENT_OTHER): Payer: Self-pay | Admitting: Emergency Medicine

## 2021-11-01 ENCOUNTER — Inpatient Hospital Stay: Payer: Medicare Other

## 2021-11-01 ENCOUNTER — Encounter: Payer: Self-pay | Admitting: Hematology and Oncology

## 2021-11-01 ENCOUNTER — Inpatient Hospital Stay: Payer: Medicare Other | Attending: Hematology and Oncology | Admitting: Hematology and Oncology

## 2021-11-01 ENCOUNTER — Telehealth: Payer: Self-pay | Admitting: Pharmacist

## 2021-11-01 VITALS — BP 139/75 | HR 79 | Temp 98.1°F | Resp 16 | Ht 59.0 in | Wt 143.4 lb

## 2021-11-01 DIAGNOSIS — Z9011 Acquired absence of right breast and nipple: Secondary | ICD-10-CM | POA: Insufficient documentation

## 2021-11-01 DIAGNOSIS — Z1501 Genetic susceptibility to malignant neoplasm of breast: Secondary | ICD-10-CM | POA: Insufficient documentation

## 2021-11-01 DIAGNOSIS — N3001 Acute cystitis with hematuria: Secondary | ICD-10-CM | POA: Insufficient documentation

## 2021-11-01 DIAGNOSIS — Z853 Personal history of malignant neoplasm of breast: Secondary | ICD-10-CM | POA: Insufficient documentation

## 2021-11-01 DIAGNOSIS — Z1509 Genetic susceptibility to other malignant neoplasm: Secondary | ICD-10-CM | POA: Insufficient documentation

## 2021-11-01 DIAGNOSIS — M8580 Other specified disorders of bone density and structure, unspecified site: Secondary | ICD-10-CM | POA: Diagnosis not present

## 2021-11-01 DIAGNOSIS — Z7982 Long term (current) use of aspirin: Secondary | ICD-10-CM | POA: Insufficient documentation

## 2021-11-01 DIAGNOSIS — R300 Dysuria: Secondary | ICD-10-CM | POA: Diagnosis present

## 2021-11-01 DIAGNOSIS — Z923 Personal history of irradiation: Secondary | ICD-10-CM | POA: Insufficient documentation

## 2021-11-01 DIAGNOSIS — Z9221 Personal history of antineoplastic chemotherapy: Secondary | ICD-10-CM | POA: Insufficient documentation

## 2021-11-01 LAB — CBC WITH DIFFERENTIAL/PLATELET
Abs Immature Granulocytes: 0.02 10*3/uL (ref 0.00–0.07)
Abs Immature Granulocytes: 0.06 10*3/uL (ref 0.00–0.07)
Basophils Absolute: 0 10*3/uL (ref 0.0–0.1)
Basophils Absolute: 0 10*3/uL (ref 0.0–0.1)
Basophils Relative: 0 %
Basophils Relative: 0 %
Eosinophils Absolute: 0.1 10*3/uL (ref 0.0–0.5)
Eosinophils Absolute: 0.1 10*3/uL (ref 0.0–0.5)
Eosinophils Relative: 1 %
Eosinophils Relative: 1 %
HCT: 36.4 % (ref 36.0–46.0)
HCT: 35.7 % — ABNORMAL LOW (ref 36.0–46.0)
Hemoglobin: 12 g/dL (ref 12.0–15.0)
Hemoglobin: 11.9 g/dL — ABNORMAL LOW (ref 12.0–15.0)
Immature Granulocytes: 0 %
Immature Granulocytes: 1 %
Lymphocytes Relative: 22 %
Lymphocytes Relative: 14 %
Lymphs Abs: 1.7 10*3/uL (ref 0.7–4.0)
Lymphs Abs: 1.6 10*3/uL (ref 0.7–4.0)
MCH: 29.1 pg (ref 26.0–34.0)
MCH: 29.2 pg (ref 26.0–34.0)
MCHC: 33 g/dL (ref 30.0–36.0)
MCHC: 33.3 g/dL (ref 30.0–36.0)
MCV: 88.1 fL (ref 80.0–100.0)
MCV: 87.7 fL (ref 80.0–100.0)
Monocytes Absolute: 0.6 10*3/uL (ref 0.1–1.0)
Monocytes Absolute: 0.8 10*3/uL (ref 0.1–1.0)
Monocytes Relative: 7 %
Monocytes Relative: 7 %
Neutro Abs: 5.4 10*3/uL (ref 1.7–7.7)
Neutro Abs: 9 10*3/uL — ABNORMAL HIGH (ref 1.7–7.7)
Neutrophils Relative %: 70 %
Neutrophils Relative %: 77 %
Platelets: 265 10*3/uL (ref 150–400)
Platelets: 251 10*3/uL (ref 150–400)
RBC: 4.13 MIL/uL (ref 3.87–5.11)
RBC: 4.07 MIL/uL (ref 3.87–5.11)
RDW: 12.4 % (ref 11.5–15.5)
RDW: 12.3 % (ref 11.5–15.5)
WBC: 7.8 10*3/uL (ref 4.0–10.5)
WBC: 11.5 10*3/uL — ABNORMAL HIGH (ref 4.0–10.5)
nRBC: 0 % (ref 0.0–0.2)
nRBC: 0 % (ref 0.0–0.2)

## 2021-11-01 LAB — URINALYSIS, ROUTINE W REFLEX MICROSCOPIC
Bilirubin Urine: NEGATIVE
Glucose, UA: NEGATIVE mg/dL
Ketones, ur: NEGATIVE mg/dL
Nitrite: NEGATIVE
Specific Gravity, Urine: <1.005 — ABNORMAL LOW (ref 1.005–1.030)
pH: 5.5 (ref 5.0–8.0)

## 2021-11-01 LAB — COMPREHENSIVE METABOLIC PANEL
ALT: 31 U/L (ref 0–44)
ALT: 30 U/L (ref 0–44)
AST: 25 U/L (ref 15–41)
AST: 25 U/L (ref 15–41)
Albumin: 4.4 g/dL (ref 3.5–5.0)
Albumin: 4.6 g/dL (ref 3.5–5.0)
Alkaline Phosphatase: 72 U/L (ref 38–126)
Alkaline Phosphatase: 67 U/L (ref 38–126)
Anion gap: 5 (ref 5–15)
Anion gap: 10 (ref 5–15)
BUN: 24 mg/dL — ABNORMAL HIGH (ref 8–23)
BUN: 20 mg/dL (ref 8–23)
CO2: 29 mmol/L (ref 22–32)
CO2: 27 mmol/L (ref 22–32)
Calcium: 9.9 mg/dL (ref 8.9–10.3)
Calcium: 9.9 mg/dL (ref 8.9–10.3)
Chloride: 104 mmol/L (ref 98–111)
Chloride: 102 mmol/L (ref 98–111)
Creatinine, Ser: 0.67 mg/dL (ref 0.44–1.00)
Creatinine, Ser: 0.74 mg/dL (ref 0.44–1.00)
GFR, Estimated: >60 mL/min (ref >60)
GFR, Estimated: >60 mL/min (ref >60)
Glucose, Bld: 96 mg/dL (ref 70–99)
Glucose, Bld: 107 mg/dL — ABNORMAL HIGH (ref 70–99)
Potassium: 4.9 mmol/L (ref 3.5–5.1)
Potassium: 4 mmol/L (ref 3.5–5.1)
Sodium: 138 mmol/L (ref 135–145)
Sodium: 139 mmol/L (ref 135–145)
Total Bilirubin: 0.4 mg/dL (ref 0.3–1.2)
Total Bilirubin: 0.4 mg/dL (ref 0.3–1.2)
Total Protein: 6.9 g/dL (ref 6.5–8.1)
Total Protein: 7.1 g/dL (ref 6.5–8.1)

## 2021-11-01 MED ORDER — REPATHA SURECLICK 140 MG/ML ~~LOC~~ SOAJ
140.0000 mg | SUBCUTANEOUS | 11 refills | Status: DC
Start: 2021-11-01 — End: 2022-09-04

## 2021-11-01 MED ORDER — CEPHALEXIN 500 MG PO CAPS: 500.0000 mg | ORAL_CAPSULE | Freq: Two times a day (BID) | ORAL | 0 refills | Status: AC

## 2021-11-01 MED ORDER — ACETAMINOPHEN 500 MG PO TABS
1000.0000 mg | ORAL_TABLET | Freq: Once | ORAL | Status: AC
  Administered 2021-11-01: 1000 mg via ORAL
  Filled 2021-11-01: qty 2

## 2021-11-01 MED ORDER — ONDANSETRON 4 MG PO TBDP
4.0000 mg | ORAL_TABLET | Freq: Once | ORAL | Status: AC
  Administered 2021-11-01: 4 mg via ORAL
  Filled 2021-11-01: qty 1

## 2021-11-01 MED ORDER — PHENAZOPYRIDINE HCL 200 MG PO TABS
200.0000 mg | ORAL_TABLET | Freq: Three times a day (TID) | ORAL | 0 refills | Status: DC | PRN
Start: 2021-11-01 — End: 2022-08-30

## 2021-11-01 NOTE — ED Provider Notes (Signed)
?[facility] EMERG[facility] DEPT ?Provider Note ? ? ?CSN: [id number] ?Arrival date & time: 11/01/21  1249 ? ?  ? ?History ? ?Chief Complaint  ?Patient presents with  ? Dysuria  ? Urinary Urgency  ? ? ?Shannon Osborne is a 66 y.o. female.  With past medical history of hyperlipidemia, GERD who presents emergency department with dysuria. ? ?Patient states that this morning she was using the restroom when she had burning with urination.  She states that she had some doctors appointments this morning however began to have more urgency throughout the day.  She states that by the time she got home she was having suprapubic pain and blood in her urine.  She endorses frequency and urgency.  She is having nausea without vomiting.  She denies fevers, flank pain.  Denies previous UTIs.  She denies any vaginal discharge. ? ? ?Dysuria ?Associated symptoms: nausea   ?Associated symptoms: no fever, no flank pain, no vaginal discharge and no vomiting   ? ?  ? ?Home Medications ?Prior to Admission medications   ?Medication Sig Start Date End Date Taking? Authorizing Provider  ?aspirin EC 81 MG tablet Take 81 mg by mouth daily. Swallow whole.   Yes [provider]  ?carisoprodol (SOMA) 350 MG tablet Take 1 tablet (350 mg total) by mouth 3 (three) times daily as needed for muscle spasms. 09/26/21  Yes Leamon Arnt, MD  ?diclofenac Sodium (VOLTAREN) 1 % GEL Apply topically 4 (four) times daily.   Yes [provider]  ?ergocalciferol (VITAMIN D2) 1.25 MG (50000 UT) capsule  10/26/11  Yes [provider]  ?ezetimibe (ZETIA) 10 MG tablet TAKE 1 TABLET AT BEDTIME 10/05/21  Yes Leamon Arnt, MD  ?losartan (COZAAR) 50 MG tablet Take 50 mg by mouth daily.   Yes [provider]  ?melatonin 3 MG TABS tablet Take 3 mg by mouth at bedtime as needed.   Yes [provider]  ?metoprolol succinate (TOPROL-XL) 25 MG 24 hr tablet Take 25 mg by mouth every morning.   Yes [provider]  ?naproxen  sodium (ALEVE) 220 MG tablet Take 220 mg by mouth daily as needed.   Yes [provider]  ?omeprazole (PRILOSEC) 20 MG capsule Take 1 capsule (20 mg total) by mouth daily. 07/07/21  Yes Leamon Arnt, MD  ?Evolocumab (REPATHA SURECLICK) 650 MG/ML SOAJ Inject 140 mg into the skin every 14 (fourteen) days. 11/01/21   Freada Bergeron, MD  ?Polyethylene Glycol 3350 (MIRALAX PO) Take by mouth.    [provider]  ?traZODone (DESYREL) 50 MG tablet Take by mouth. As needed for sleep    [provider]  ?   ? ?Allergies    ?Baclofen, Cyclobenzaprine, Escitalopram oxalate, Iodine, Rosuvastatin, Sertraline, Simvastatin, Atorvastatin, Codeine, Contrast media [iodinated contrast media], Hydrochlorothiazide w-triamterene, Hydrocodone-acetaminophen, and Oxycodone hcl   ? ?Review of Systems   ?Review of Systems  ?Constitutional:  Negative for fever.  ?Gastrointestinal:  Positive for nausea. Negative for diarrhea and vomiting.  ?Genitourinary:  Positive for dysuria, frequency, hematuria and urgency. Negative for flank pain and vaginal discharge.  ?All other systems reviewed and are negative. ? ?Physical Exam ?Updated Vital Signs ?BP (!) 158/93 (BP Location: Left Arm)   Pulse 92   Temp 98.3 ?F (36.8 ?C) (Temporal)   Resp 18   SpO2 97%  ?Physical Exam ?Vitals and nursing note reviewed.  ?Constitutional:   ?   General: She is not in acute distress. ?   Appearance: Normal appearance.  She is not ill-appearing.  ?HENT:  ?   Head: Normocephalic.  ?Eyes:  ?   General: No scleral icterus. ?   Extraocular Movements: Extraocular movements intact.  ?Cardiovascular:  ?   Rate and Rhythm: Normal rate and regular rhythm.  ?   Pulses: Normal pulses.  ?Pulmonary:  ?   Effort: Pulmonary effort is normal.  ?   Breath sounds: Normal breath sounds.  ?Abdominal:  ?   General: Abdomen is protuberant. Bowel sounds are normal. There is no distension.  ?   Palpations: Abdomen is soft.  ?   Tenderness: There is abdominal  tenderness in the suprapubic area. There is no right CVA tenderness or left CVA tenderness.  ?Musculoskeletal:     ?   General: Normal range of motion.  ?Skin: ?   General: Skin is warm and dry.  ?   Capillary Refill: Capillary refill takes less than 2 seconds.  ?Neurological:  ?   General: No focal deficit present.  ?   Mental Status: She is alert and oriented to person, place, and time. Mental status is at baseline.  ?Psychiatric:     ?   Mood and Affect: Mood normal.     ?   Behavior: Behavior normal.     ?   Thought Content: Thought content normal.     ?   Judgment: Judgment normal.  ? ? ?ED Results / Procedures / Treatments   ?Labs ?(all labs ordered are listed, but only abnormal results are displayed) ?Labs Reviewed  ?URINALYSIS, ROUTINE W REFLEX MICROSCOPIC - Abnormal; Notable for the following components:  ?    Result Value  ? Color, Urine COLORLESS (*)   ? Specific Gravity, Urine <1.005 (*)   ? Hgb urine dipstick LARGE (*)   ? Protein, ur TRACE (*)   ? Leukocytes,Ua MODERATE (*)   ? Bacteria, UA RARE (*)   ? All other components within normal limits  ?COMPREHENSIVE METABOLIC PANEL - Abnormal; Notable for the following components:  ? Glucose, Bld 107 (*)   ? All other components within normal limits  ?CBC WITH DIFFERENTIAL/PLATELET - Abnormal; Notable for the following components:  ? WBC 11.5 (*)   ? Hemoglobin 11.9 (*)   ? HCT 35.7 (*)   ? Neutro Abs 9.0 (*)   ? All other components within normal limits  ?URINE CULTURE  ? ?EKG ?None ? ?Radiology ?No results found. ? ?Procedures ?Procedures  ? ?Medications Ordered in ED ?Medications  ?ondansetron (ZOFRAN-ODT) disintegrating tablet 4 mg (has no administration in time range)  ?acetaminophen (TYLENOL) tablet 1,000 mg (has no administration in time range)  ? ? ?ED Course/ Medical Decision Making/ A&P ?  ?                        ?Medical Decision Making ?Amount and/or Complexity of Data Reviewed ?Labs: ordered. ? ?Risk ?OTC drugs. ?Prescription drug  management. ? ?This patient presents to the ED for concern of dysuria, this involves an extensive number of treatment options, and is a complaint that carries with it a high risk of complications and morbidity.  The differential diagnosis includes UTI, pyelonephritis, stone  ? ?Co morbidities that complicate the patient evaluation ?none ? ?Additional history obtained:  ?Additional history obtained from: none ?External records from outside source obtained and reviewed including: none ? ?Lab Results: ?I personally ordered, reviewed, and interpreted labs. ?Pertinent results include: ?UA + UTI ?Urine culture sent and pending  ?CBC without  leukocytosis ?BMP without AKI ? ?Medications  ?I ordered medication including tylenol and zofran for pain, nausea ?Reevaluation of the patient after medication shows that patient stayed the same ? ?ED Course: ?66 year old female presents to emergency department with dysuria. ? ?Patient presents with symptoms consistent with acute uncomplicated cystitis.  She has no systemic symptoms.  She is nonseptic and she is well-appearing.  I have low suspicion for acute pyelonephritis given lack of fever, CVA tenderness or systemic features.  Low suspicion for kidney stone or infected stone.  Low suspicion of other etiologies of pain like ovarian torsion, PID or appendicitis. ? ?UA consistent with cystitis.  Obtain labs to check renal function which is normal. ? ?We will give prescription for Pyridium, Keflex.  Given return precautions for worsening symptoms.  Patient verbalized understanding. ? ?After consideration of the diagnostic results and the patients response to treatment, I feel that the patent would benefit from discharge. ?The patient has been appropriately medically screened and/or stabilized in the ED. I have low suspicion for any other emergent medical condition which would require further screening, evaluation or treatment in the ED or require inpatient management.The patient is  overall well appearing and non-toxic in appearance. They are hemodynamically stable at time of discharge.   ?Final Clinical Impression(s) / ED Diagnoses ?Final diagnoses:  ?Acute cystitis with hematuria  ? ? ?Rx / DC O

## 2021-11-01 NOTE — Progress Notes (Signed)
Shannon Osborne ?CONSULT NOTE ? ?Patient Care Team: ?Leamon Arnt, MD as PCP - General (Family Medicine) ?Freada Bergeron, MD as PCP - Cardiology (Cardiology) ?Verl Dicker, MD as Referring Physician (Orthopedic Surgery) ?Stechschulte, Nickola Major, MD as Consulting Physician (Surgery) ? ?CHIEF COMPLAINTS/PURPOSE OF CONSULTATION:  ?History of breast cancer ? ?ASSESSMENT & PLAN:  ? 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  She completed adjuvant chemotherapy with TAC, intolerant to Femara Arimidex and Aromasin, declined that tamoxifen.  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  She has been cancer free for the past 14 years.  She just moved to the [place] and establishing with doctors here and was referred to high risk breast clinic given her BRCA1 mutation.  She denies any new symptoms except for some increased sensitivity in the area of right postmastectomy scar.  No change in bowel habits, unusual weight loss.  We have discussed the following risks with BRCA1 mutation. ? ?BRCA1 mutation: ?I discussed with the patient that BRCA1 is a tumor suppressor gene which helps repair damaged DNA. In patients with BRCA1 or 2 mutations, the damaged DNA could not be repaired properly increasing the risk of cancers. These mutations are inherited in autosomal dominant fashion and hence 50% probability that their children may have a BRCA mutation. ? ?Cancer risk:  ?Breast - 72 percent ?Ovarian - 44 percent ? ?Other gynecological malignancies: ?1.  Fallopian tube cancers: 0.6% ?2. primary peritoneal cancer: 1.3% ?3.  Endometrial cancer: 1.9% ?4.  Pancreatic cancer: 1% ?5.  Prostate cancer sent female: 9% ?Slight increase in colorectal cancers,  stomach and biliary cancers have been reported although the absolute risk has not been quantified.  BRCA1 patients are not at risk for melanoma. ? ?Since she does not have a definitive family history of pancreatic cancer, she does not qualify for MRI screening of the pancreas at this time however this can be considered if she happens to find out more about her family history.  She is already status post bilateral mastectomy and bilateral salpingo oophorectomy with total abdominal hysterectomy.  She understands that may carriers of BRCA 1 can have increased risk of prostate cancer.  She does not have any children. ?No other symptoms concerning for recurrence or new cancer today. ?She should return to clinic in 6 months. ? ?HISTORY OF PRESENTING ILLNESS:  ?Shannon Osborne 66 y.o. female is here because of high risk for breast cncer. ? ?This is a very pleasant 66 year old postmenopausal female patient with past medical history significant for right-sided breast cancer stage II, T2 N0 M0 invasive ductal carcinoma status post right mastectomy and sentinel lymph node dissection.  2 specimens present 1 was grade 3, ER negative PR negative and HER2 negative with a high Ki-67 index of 54%, the second mass was grade 3 2% ER positive, PR negative, HER2 negative with Ki-67 at 34%.  She had chemotherapy with TAC x6 completed February 06, 2008.  She was intolerant to Femara and Arimidex and subsequently Aromasin.  She declined a trial of tamoxifen.  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  She was previously treated by Dr. Alben Spittle in [facility], moved to the [facility] and is establishing with [place] given 1 mutation. ?Patient she does not quite know if she inherited her from her dad thyroid or monocytes.  Her dad had 4 sisters with breast cancer.  There appears to be history of female breast cancer on her maternal  side. ? ?She is now following up with Dr. Genia Harold in fertility clinic for gynecology evaluation and has Ca1 25 monitoring.  She denies any new complaints today.  She feels well except for some increased sensitivity in the right breast.  She remembers having increased sensitivity to caffeine content before the diagnosis of breast cancer and hence was worried if this would indicate any breast cancer recurrence.  No change in breathing, bowel habits or urinary habits.  No new neurological complaints.  She is up-to-date with her colonoscopy.  Rest of the pertinent 10 point ROS reviewed and negative. ? ?REVIEW OF SYSTEMS:   ?Constitutional: Denies fevers, chills or abnormal night sweats ?Eyes: Denies blurriness of vision, double vision or watery eyes ?Ears, nose, mouth, throat, and face: Denies mucositis or sore throat ?Respiratory: Denies cough, dyspnea or wheezes ?Cardiovascular: Denies palpitation, chest discomfort or lower extremity swelling ?Gastrointestinal:  Denies nausea, heartburn or change in bowel habits ?Skin: Denies abnormal skin rashes ?Lymphatics: Denies new lymphadenopathy or easy bruising ?Neurological:Denies numbness, tingling or new weaknesses ?Behavioral/Psych: Mood is stable, no new changes  ?All other systems were reviewed with the patient and are negative. ? ?MEDICAL HISTORY:  ?Past Medical History:  ?Diagnosis Date  ? 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  ? Breast cancer ([place]) 2009  ? BRACA 1 Pos   ? Chronic midline low back pain without sciatica 02/18/2020  ? GERD (gastroesophageal reflux disease) 02/18/2020  ? Hyperlipemia   ? Multiple drug allergies 02/18/2020  ? Myalgia due to statin 02/18/2020  ? 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  ? MRI 05/2018  ? Osteopenia after menopause 02/11/2021  ? DEXA 01/2021 lowest T = -1.7 femur; recheck 2-3 years  ? ? ?SURGICAL HISTORY: ?Past Surgical History:  ?Procedure Laterality Date  ? ABDOMINAL HYSTERECTOMY  2002  ? BREAST SURGERY    ? right 09/10/2007, left  06/16/2009  ? FOOT BONE EXCISION    ? SHOULDER ARTHROTOMY Left 2013  ? and 06/2015  ? TONSILLECTOMY  2012  ? ? ?SOCIAL HISTORY: ?Social History  ? ?Socioeconomic History  ? Marital status: Single  ?  Spouse name: Not on file  ? Number of children: Not on file  ? Years of education: Not on file  ? Highest education level: Not on file  ?Occupational History  ? Not on file  ?Tobacco Use  ? Smoking status: Never  ? Smokeless tobacco: Never  ?Vaping Use  ? Vaping Use: Never used  ?Substance and Sexual Activity  ? Alcohol use: Yes  ?  Comment: 10 drinks a year   ? Drug use: Never  ? Sexual activity: Yes  ?  Partners: Male  ?Other Topics Concern  ? Not on file  ?Social History Narrative  ? Not on file  ? ?Social Determinants of Health  ? ?Financial Resource Strain: Low Risk   ? Difficulty of Paying Living Expenses: Not hard at all  ?Food Insecurity: No Food Insecurity  ? Worried About Charity fundraiser in the Last Year: Never true  ? Ran Out of Food in the Last Year: Never true  ?Transportation Needs: No Transportation Needs  ? Lack of Transportation (Medical): No  ? Lack of  Transportation (Non-Medical): No  ?Physical Activity: Sufficiently Active  ? Days of Exercise per Week: 6 days  ? Minutes of Exercise per Session: 60 min  ?Stress: No Stress Concern Present  ? Feeling of Stress : Not at all  ?Social Connections: Socially Isolated  ? Frequency of Communication with Friends and Family: More than three times a week  ? Frequency of Social Gatherings with Friends and Family: More than three times a week  ? Attends Religious Services: Never  ? Active Member of Clubs or Organizations: No  ? Attends Archivist Meetings: Never  ? Marital Status: Never married  ?Intimate Partner Violence: Not At Risk  ? Fear of Current or Ex-Partner: No  ? Emotionally Abused: No  ? Physically Abused: No  ? Sexually Abused: No  ? ? ?FAMILY HISTORY: ?Family History  ?Problem Relation Age of Onset  ? Hypertension Mother   ? Colon polyps  Mother   ? Gallstones Mother   ? Stroke Father   ? Hypertension Brother   ? Gallstones Brother   ? Hypertension Brother   ? Gallstones Brother   ? Hypertension Brother   ? Gallstones Brother   ? ? ?ALLERGIES:  is al

## 2021-11-01 NOTE — ED Triage Notes (Signed)
Pt arrives to ED with c/o dysuria, urinary frequency/urgency, hematuria that started this morning. No flank pain, CVA tenderness.  ?

## 2021-11-01 NOTE — Telephone Encounter (Addendum)
Repatha PA approved through 10/31/22, rx sent to pharmacy. Copay ~$70 but pt qualifies for copay card. She'll need to call Rep[facility] at [phone] to get set up with a copay card to bring her cost down to $5. Will recheck fasting labs with PCP in April. Pt aware of med plan and had no further questions. ?

## 2021-11-01 NOTE — ED Notes (Signed)
Patient verbalizes understanding of discharge instructions. Opportunity for questioning and answers were provided. Patient discharged from ED.  °

## 2021-11-01 NOTE — Discharge Instructions (Addendum)
You are seen in the emergency department today for pain with urination.  You do have a UTI.  I am prescribing you an antibiotic called Keflex that you will take twice a day over the next 5 days.  Additionally prescribing a medication called Pyridium that you can take up to 3 times a day as needed for spasms.  Please return to the emergency department for pain ascends to your back or fevers with ongoing pain with urination. ?

## 2021-11-04 LAB — URINE CULTURE: Culture: >=100000 — AB

## 2021-11-05 ENCOUNTER — Telehealth: Payer: Self-pay

## 2021-11-05 NOTE — Telephone Encounter (Signed)
Post ED Visit - Positive Culture Follow-up ? ?Culture report reviewed by antimicrobial stewardship pharmacist: ?[facility] Team ?'[x]'$  Bertis Ruddy, Pharm.D. ?'[]'$  Heide Guile, Pharm.D., BCPS AQ-ID ?'[]'$  Parks Neptune, Pharm.D., BCPS ?'[]'$  Alycia Rossetti, Pharm.D., BCPS ?'[]'$  Bagley, Pharm.D., BCPS, AAHIVP ?'[]'$  Legrand Como, Pharm.D., BCPS, AAHIVP ?'[]'$  Salome Arnt, PharmD, BCPS ?'[]'$  Johnnette Gourd, PharmD, BCPS ?'[]'$  Hughes Better, PharmD, BCPS ?'[]'$  Leeroy Cha, PharmD ?'[]'$  Laqueta Linden, PharmD, BCPS ?'[]'$  Albertina Parr, PharmD ? ?[place] Team ?'[]'$  Leodis Sias, PharmD ?'[]'$  Lindell Spar, PharmD ?'[]'$  Royetta Asal, PharmD ?'[]'$  Graylin Shiver, Rph ?'[]'$  Rema Fendt) Shannon Mac, PharmD ?'[]'$  Arlyn Dunning, PharmD ?'[]'$  Netta Cedars, PharmD ?'[]'$  Dia Sitter, PharmD ?'[]'$  Leone Haven, PharmD ?'[]'$  Gretta Arab, PharmD ?'[]'$  Theodis Shove, PharmD ?'[]'$  Peggyann Juba, PharmD ?'[]'$  Reuel Boom, PharmD ? ? ?Positive urine culture ?Treated with Cephalexin, organism sensitive to the same and no further patient follow-up is required at this time. ? ?Shannon Osborne ?11/05/2021, 12:37 PM ?  ?

## 2021-12-06 ENCOUNTER — Encounter: Payer: Medicare Other | Admitting: Family Medicine

## 2021-12-08 ENCOUNTER — Telehealth: Payer: Self-pay | Admitting: Cardiology

## 2021-12-08 DIAGNOSIS — E782 Mixed hyperlipidemia: Secondary | ICD-10-CM

## 2021-12-08 NOTE — Telephone Encounter (Signed)
Did not need this encounter °

## 2021-12-09 ENCOUNTER — Other Ambulatory Visit: Payer: Medicare Other | Admitting: *Deleted

## 2021-12-09 DIAGNOSIS — E782 Mixed hyperlipidemia: Secondary | ICD-10-CM

## 2021-12-09 LAB — LIPID PANEL
Chol/HDL Ratio: 2.8 ratio (ref 0.0–4.4)
Cholesterol, Total: 151 mg/dL (ref 100–199)
HDL: 53 mg/dL (ref >39)
LDL Chol Calc (NIH): 60 mg/dL (ref 0–99)
Triglycerides: 241 mg/dL — ABNORMAL HIGH (ref 0–149)
VLDL Cholesterol Cal: 38 mg/dL (ref 5–40)

## 2021-12-09 LAB — HEPATIC FUNCTION PANEL
ALT: 21 IU/L (ref 0–32)
AST: 23 IU/L (ref 0–40)
Albumin: 4.6 g/dL (ref 3.8–4.8)
Alkaline Phosphatase: 80 IU/L (ref 44–121)
Bilirubin Total: 0.4 mg/dL (ref 0.0–1.2)
Bilirubin, Direct: <0.1 mg/dL (ref 0.00–0.40)
Total Protein: 6.7 g/dL (ref 6.0–8.5)

## 2021-12-13 ENCOUNTER — Telehealth: Payer: Self-pay | Admitting: *Deleted

## 2021-12-13 DIAGNOSIS — E782 Mixed hyperlipidemia: Secondary | ICD-10-CM

## 2021-12-13 DIAGNOSIS — Z79899 Other long term (current) drug therapy: Secondary | ICD-10-CM

## 2021-12-13 DIAGNOSIS — I2510 Atherosclerotic heart disease of native coronary artery without angina pectoris: Secondary | ICD-10-CM

## 2021-12-13 DIAGNOSIS — Z789 Other specified health status: Secondary | ICD-10-CM

## 2021-12-13 MED ORDER — ICOSAPENT ETHYL 1 G PO CAPS: 2.0000 g | ORAL_CAPSULE | Freq: Two times a day (BID) | ORAL | 3 refills | Status: DC

## 2021-12-13 NOTE — Telephone Encounter (Signed)
-----   Message from Freada Bergeron, MD sent at 12/13/2021  8:19 AM EDT ----- ?Cholesterol looks great with LDL at goal <70. Her triglycerides are elevated. Can we start her on vascepa 2g BID? Repeat lipids to ensure her TG are improving in 8 weeks. ?

## 2021-12-13 NOTE — Telephone Encounter (Signed)
The patient has been notified of the result and verbalized understanding.  All questions (if any) were answered. ? ?Pt aware to start taking Vascepa 2 grams po BID.  ?She is aware we will do repeat lipids on her in 8 weeks.  ?Confirmed the pharmacy of choice with the pt.  ?Pt will have repeat lipids in 8 weeks on 02/07/22.  She is aware to come fasting to this lab appt.  ?Pt verbalized understanding and agrees with this plan. ? ?

## 2022-02-07 ENCOUNTER — Other Ambulatory Visit: Payer: Medicare Other

## 2022-02-07 ENCOUNTER — Other Ambulatory Visit: Payer: Self-pay

## 2022-02-07 DIAGNOSIS — Z789 Other specified health status: Secondary | ICD-10-CM

## 2022-02-07 DIAGNOSIS — E782 Mixed hyperlipidemia: Secondary | ICD-10-CM

## 2022-02-07 DIAGNOSIS — I2510 Atherosclerotic heart disease of native coronary artery without angina pectoris: Secondary | ICD-10-CM

## 2022-02-07 DIAGNOSIS — Z79899 Other long term (current) drug therapy: Secondary | ICD-10-CM

## 2022-02-08 LAB — LIPID PANEL
Chol/HDL Ratio: 3.5 ratio (ref 0.0–4.4)
Cholesterol, Total: 166 mg/dL (ref 100–199)
HDL: 47 mg/dL (ref >39)
LDL Chol Calc (NIH): 73 mg/dL (ref 0–99)
Triglycerides: 285 mg/dL — ABNORMAL HIGH (ref 0–149)
VLDL Cholesterol Cal: 46 mg/dL — ABNORMAL HIGH (ref 5–40)

## 2022-02-27 ENCOUNTER — Ambulatory Visit: Payer: Medicare Other

## 2022-03-02 ENCOUNTER — Ambulatory Visit (INDEPENDENT_AMBULATORY_CARE_PROVIDER_SITE_OTHER): Payer: Medicare Other | Admitting: Family Medicine

## 2022-03-02 ENCOUNTER — Encounter: Payer: Self-pay | Admitting: Family Medicine

## 2022-03-02 VITALS — BP 128/64 | HR 72 | Temp 98.0°F | Ht 59.0 in | Wt 138.8 lb

## 2022-03-02 DIAGNOSIS — R931 Abnormal findings on diagnostic imaging of heart and coronary circulation: Secondary | ICD-10-CM | POA: Insufficient documentation

## 2022-03-02 DIAGNOSIS — Z1501 Genetic susceptibility to malignant neoplasm of breast: Secondary | ICD-10-CM | POA: Diagnosis not present

## 2022-03-02 DIAGNOSIS — M791 Myalgia, unspecified site: Secondary | ICD-10-CM

## 2022-03-02 DIAGNOSIS — Z79899 Other long term (current) drug therapy: Secondary | ICD-10-CM | POA: Diagnosis not present

## 2022-03-02 DIAGNOSIS — M4726 Other spondylosis with radiculopathy, lumbar region: Secondary | ICD-10-CM

## 2022-03-02 DIAGNOSIS — Z77090 Contact with and (suspected) exposure to asbestos: Secondary | ICD-10-CM

## 2022-03-02 DIAGNOSIS — T466X5A Adverse effect of antihyperlipidemic and antiarteriosclerotic drugs, initial encounter: Secondary | ICD-10-CM

## 2022-03-02 DIAGNOSIS — E782 Mixed hyperlipidemia: Secondary | ICD-10-CM | POA: Diagnosis not present

## 2022-03-02 DIAGNOSIS — Z1509 Genetic susceptibility to other malignant neoplasm: Secondary | ICD-10-CM

## 2022-03-02 DIAGNOSIS — I700 Atherosclerosis of aorta: Secondary | ICD-10-CM

## 2022-03-02 DIAGNOSIS — I10 Essential (primary) hypertension: Secondary | ICD-10-CM

## 2022-03-02 DIAGNOSIS — I340 Nonrheumatic mitral (valve) insufficiency: Secondary | ICD-10-CM | POA: Insufficient documentation

## 2022-03-02 LAB — COMPREHENSIVE METABOLIC PANEL
ALT: 17 U/L (ref 0–35)
AST: 19 U/L (ref 0–37)
Albumin: 4.5 g/dL (ref 3.5–5.2)
Alkaline Phosphatase: 67 U/L (ref 39–117)
BUN: 17 mg/dL (ref 6–23)
CO2: 30 mEq/L (ref 19–32)
Calcium: 9.4 mg/dL (ref 8.4–10.5)
Chloride: 103 mEq/L (ref 96–112)
Creatinine, Ser: 0.64 mg/dL (ref 0.40–1.20)
GFR: 90.96 mL/min (ref >60.00)
Glucose, Bld: 93 mg/dL (ref 70–99)
Potassium: 4.1 mEq/L (ref 3.5–5.1)
Sodium: 139 mEq/L (ref 135–145)
Total Bilirubin: 0.6 mg/dL (ref 0.2–1.2)
Total Protein: 6.6 g/dL (ref 6.0–8.3)

## 2022-03-02 LAB — CBC WITH DIFFERENTIAL/PLATELET
Basophils Absolute: 0 10*3/uL (ref 0.0–0.1)
Basophils Relative: 0.4 % (ref 0.0–3.0)
Eosinophils Absolute: 0.1 10*3/uL (ref 0.0–0.7)
Eosinophils Relative: 2.4 % (ref 0.0–5.0)
HCT: 36.1 % (ref 36.0–46.0)
Hemoglobin: 11.9 g/dL — ABNORMAL LOW (ref 12.0–15.0)
Lymphocytes Relative: 31.8 % (ref 12.0–46.0)
Lymphs Abs: 1.5 10*3/uL (ref 0.7–4.0)
MCHC: 32.9 g/dL (ref 30.0–36.0)
MCV: 90.5 fl (ref 78.0–100.0)
Monocytes Absolute: 0.4 10*3/uL (ref 0.1–1.0)
Monocytes Relative: 8.3 % (ref 3.0–12.0)
Neutro Abs: 2.8 10*3/uL (ref 1.4–7.7)
Neutrophils Relative %: 57.1 % (ref 43.0–77.0)
Platelets: 232 10*3/uL (ref 150.0–400.0)
RBC: 3.99 Mil/uL (ref 3.87–5.11)
RDW: 12.6 % (ref 11.5–15.5)
WBC: 4.9 10*3/uL (ref 4.0–10.5)

## 2022-03-02 LAB — TSH: TSH: 2.12 u[IU]/mL (ref 0.35–5.50)

## 2022-03-02 LAB — VITAMIN B12: Vitamin B-12: 853 pg/mL (ref 211–911)

## 2022-03-02 NOTE — Progress Notes (Signed)
Subjective  Chief Complaint  Patient presents with   Annual Exam    Pt here for Annual exam and is not currently fasting    HPI: Shannon Osborne is a 66 y.o. female who presents to [facility] today for a Female Wellness Visit. She also has the concerns and/or needs as listed above in the chief complaint. These will be addressed in addition to the Health Maintenance Visit.   Wellness Visit: annual visit with health maintenance review and exam without Pap  Health maintenance: Working hard on eating a clean diet.  Working on lowering cholesterol.  Remains active.  Traveling to [place] to help her mother often.  Immunizations: Eligible for second Shingrix but will defer until she returns from [place].  Overall she is doing very well.  Feeling well.  Active  Chronic disease f/u and/or acute problem visit: (deemed necessary to be done in addition to the wellness visit): Hypertension: Feeling well on current medications.  Reviewed recent cardiology notes.  No chest pain, shortness of breath or lower extremity edema CAD and atherosclerosis: Asymptomatic.  Very elevated calcium score.  Primary prevention measures in place.  On Repatha due to statin intolerance.  On Vascepa.  Working to lower cholesterol.  Continue aspirin daily. BRCA positive: Now seeing [facility] for high risk given history of breast cancer.  They will take over screening for ovarian cancer. 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  This was for about 1.5 years.  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  Total exposure less than 2.5 years.  Most recent chest x-ray in November was normal.  No history of asbestos related disease.  [facility]. GERD is well controlled on chronic PPI.  At risk for B12 deficiencies Osteoarthritis of spine with spondylolisthesis going to see neurosurgery soon.  Describes some radicular  pain.  Assessment  1. Essential hypertension   2. Mixed hyperlipidemia   3. Myalgia due to statin   4. BRCA1 gene mutation positive   5. Long-term current use of proton pump inhibitor therapy   6. Osteoarthritis of spine with radiculopathy, lumbar region   7. History of asbestos exposure   8. Mild mitral regurgitation   9. Atherosclerosis of aorta ([place])   10. Agatston coronary artery calcium score between 200 and 399      Plan  Female Wellness Visit: Age appropriate Health Maintenance and Prevention measures were discussed with patient. Included topics are cancer screening recommendations, ways to keep healthy (see AVS) including dietary and exercise recommendations, regular eye and dental care, use of seat belts, and avoidance of moderate alcohol use and tobacco use.  Screens are current BMI: discussed patient's BMI and encouraged positive lifestyle modifications to help get to or maintain a target BMI. HM needs and immunizations were addressed and ordered. See below for orders. See HM and immunization section for updates. Routine labs and screening tests ordered including cmp, cbc and lipids where appropriate. Discussed recommendations regarding Vit D and calcium supplementation (see AVS)  Chronic disease management visit and/or acute problem visit: Hypertension is controlled.  Continue current medications Hyperlipidemia on Repatha and Vascepa.  Improving diet LDL is at goal CAD and atherosclerosis: Reviewed notes and findings.  We will push prevention measures. Continue PPI check for B12 deficiency Follow with cancer oncology clinic.  Follow up: 6 months for hypertension recheck Orders Placed This Encounter  Procedures   CBC with Differential/Platelet   Comprehensive metabolic panel   Vitamin H60   TSH   No orders of the defined types were placed in this encounter.     Body mass index is 28.03 kg/m. Wt Readings from Last 3 Encounters:  03/02/22 138 lb 12.8 oz (63 kg)   11/01/21 143 lb 6.4 oz (65 kg)  10/07/21 140 lb (63.5 kg)     Patient Active Problem List   Diagnosis Date Noted   Atherosclerosis of aorta ([place]) 03/02/2022    Priority: High    CT scan and very elevated calcium score 2023    Agatston coronary artery calcium score between 200 and 399 03/02/2022    Priority: High    2023, 90% for age    Essential hypertension 07/07/2021    Priority: High   History of breast cancer 02/18/2020    Priority: High   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    Priority: High   Myalgia due to statin 02/18/2020    Priority: High   Mixed hyperlipidemia 02/18/2020    Priority: High   Multiple drug allergies 02/18/2020    Priority: High   History of asbestos exposure 03/02/2022    Priority: Medium     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 Johney Frame Repeat in 3-5 years    Status post bilateral mastectomy 02/18/2020    Priority: Low   Status post shoulder replacement, 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    Priority: [place] Maintenance  Topic Date Due   COVID-19 Vaccine (5 - Booster for [place] series) 03/18/2022 (Originally 05/19/2021)   Zoster  Vaccines- Shingrix (2 of 2) 06/30/2022 (Originally 05/31/2020)   INFLUENZA VACCINE  03/21/2022   DEXA SCAN  01/27/2023   COLONOSCOPY (Pts 45-37yr Insurance coverage will need to be confirmed)  10/21/2024   Pneumonia Vaccine 66 Years old  Completed   Hepatitis C Screening  Completed   HPV VACCINES  Aged Out   Immunization History  Administered Date(s) Administered   Fluad Quad(high Dose 65+) 04/28/2020   Influenza Whole 05/22/2011   Influenza-Unspecified 06/26/2015, 05/15/2021   PFIZER(Purple Top)SARS-COV-2 Vaccination 10/03/2019, 10/28/2019, 05/28/2020, 03/24/2021   Pneumococcal Conjugate-13 03/17/2015   Pneumococcal Polysaccharide-23 01/14/2009, 11/29/2020   Zoster Recombinat (Shingrix) 04/05/2020   We updated and reviewed the patient's past history in detail and it is documented below. Allergies: Patient is allergic to baclofen, cyclobenzaprine, escitalopram oxalate, iodine, rosuvastatin, sertraline, simvastatin, atorvastatin, codeine, contrast media [iodinated contrast media], hydrochlorothiazide w-triamterene, hydrocodone-acetaminophen, and oxycodone hcl. Past Medical History  Patient  has a past medical history of BRCA1 gene mutation positive (02/18/2020), Breast cancer ([place]) (2009), Chronic midline low back pain without sciatica (02/18/2020), GERD (gastroesophageal reflux disease) (02/18/2020), Hyperlipemia, Multiple drug allergies (02/18/2020), Myalgia due to statin (02/18/2020), Osteoarthritis of lumbar spine (02/18/2020), and Osteopenia after menopause (02/11/2021). Past Surgical History Patient  has a past surgical history that includes Breast surgery; Abdominal hysterectomy (2002); Tonsillectomy (2012); Shoulder arthrotomy (Left, 2013); and Foot bone excision. Family History: Patient family history includes Colon polyps in her mother; Gallstones in her brother, brother, brother, and mother; Hypertension in her brother, brother, brother, and mother; Stroke in her father. Social  History:  Patient  reports that she has never smoked. She has never used smokeless tobacco. She reports current alcohol use. She reports that she does not use drugs.  Review of Systems: Constitutional: negative for fever or malaise Ophthalmic: negative for photophobia, double vision or loss of vision Cardiovascular: negative for chest pain, dyspnea on exertion, or new LE swelling Respiratory: negative for SOB or persistent cough Gastrointestinal: negative for abdominal pain, change in bowel habits or melena Genitourinary: negative for dysuria or gross hematuria, no abnormal uterine bleeding or disharge Musculoskeletal: negative for new gait disturbance or muscular weakness Integumentary: negative for new or persistent rashes, no breast lumps Neurological: negative for TIA or stroke symptoms Psychiatric: negative for SI or delusions Allergic/Immunologic: negative for hives  Patient Care Team    Relationship Specialty Notifications Start End  Leamon Arnt, MD PCP - General Family Medicine  02/18/20   Freada Bergeron, MD PCP - Cardiology Cardiology  10/03/21   Verl Dicker, MD Referring Physician Orthopedic Surgery  02/18/20   Stechschulte, Nickola Major, MD Consulting Physician Surgery  07/07/21     Objective  Vitals: BP 128/64   Pulse 72   Temp 98 F (36.7 C)   Ht _0  (1.499 m)   Wt 138 lb 12.8 oz (63 kg)   SpO2 98%   BMI 28.03 kg/m  General:  Well developed, well nourished, no acute distress  Psych:  Alert and orientedx3,normal mood and affect HEENT:  Normocephalic, atraumatic, non-icteric sclera,  supple neck without adenopathy, mass or thyromegaly Cardiovascular:  Normal S1, S2, RRR without gallop, rub or murmur Respiratory:  Good breath sounds bilaterally, CTAB with normal respiratory effort Gastrointestinal: normal bowel sounds, soft, non-tender, no noted masses. No HSM MSK: no deformities, contusions. Joints are without erythema or swelling.     Commons side effects,  risks, benefits, and alternatives for medications and treatment plan prescribed today were discussed, and the patient expressed understanding of the given instructions. Patient is instructed to call or message via MyChart if he/she has any questions or concerns regarding our treatment plan. No barriers to understanding were identified. We discussed Red Flag symptoms and signs in detail. Patient expressed understanding regarding what to do in case of urgent or emergency type symptoms.  Medication list was reconciled, printed and provided to the patient in AVS. Patient instructions and summary information was reviewed with the patient as documented in the AVS. This note was prepared with assistance of Dragon voice recognition software. Occasional wrong-word or sound-a-like substitutions may have occurred due to the inherent limitations of voice recognition software  This visit occurred during the SARS-CoV-2 public health emergency.  Safety protocols were in place, including screening questions prior to the visit, additional usage of staff PPE, and extensive cleaning of exam room while observing appropriate contact time as indicated for disinfecting solutions.

## 2022-03-02 NOTE — Patient Instructions (Signed)

## 2022-04-25 ENCOUNTER — Other Ambulatory Visit: Payer: Self-pay

## 2022-04-25 DIAGNOSIS — Z79899 Other long term (current) drug therapy: Secondary | ICD-10-CM

## 2022-04-25 DIAGNOSIS — Z789 Other specified health status: Secondary | ICD-10-CM

## 2022-04-25 DIAGNOSIS — E782 Mixed hyperlipidemia: Secondary | ICD-10-CM

## 2022-04-25 DIAGNOSIS — I2510 Atherosclerotic heart disease of native coronary artery without angina pectoris: Secondary | ICD-10-CM

## 2022-04-25 MED ORDER — ICOSAPENT ETHYL 1 G PO CAPS: 2.0000 g | ORAL_CAPSULE | Freq: Two times a day (BID) | ORAL | 1 refills | Status: DC

## 2022-04-28 ENCOUNTER — Telehealth: Payer: Self-pay | Admitting: Family Medicine

## 2022-04-28 NOTE — Telephone Encounter (Signed)
Spoke with patient she req CB 06/2022

## 2022-05-04 ENCOUNTER — Ambulatory Visit: Payer: Medicare Other | Admitting: Hematology and Oncology

## 2022-05-15 ENCOUNTER — Encounter: Payer: Self-pay | Admitting: *Deleted

## 2022-06-08 ENCOUNTER — Other Ambulatory Visit: Payer: Self-pay

## 2022-06-08 ENCOUNTER — Inpatient Hospital Stay: Payer: Medicare Other | Attending: Hematology and Oncology | Admitting: Hematology and Oncology

## 2022-06-08 VITALS — BP 136/78 | HR 77 | Temp 97.2°F | Resp 18 | Wt 143.6 lb

## 2022-06-08 DIAGNOSIS — Z853 Personal history of malignant neoplasm of breast: Secondary | ICD-10-CM | POA: Insufficient documentation

## 2022-06-08 DIAGNOSIS — Z1509 Genetic susceptibility to other malignant neoplasm: Secondary | ICD-10-CM | POA: Insufficient documentation

## 2022-06-08 DIAGNOSIS — Z803 Family history of malignant neoplasm of breast: Secondary | ICD-10-CM | POA: Diagnosis not present

## 2022-06-08 DIAGNOSIS — Z9013 Acquired absence of bilateral breasts and nipples: Secondary | ICD-10-CM | POA: Diagnosis not present

## 2022-06-08 NOTE — Progress Notes (Signed)
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  She completed adjuvant chemotherapy with TAC, intolerant to Femara Arimidex and Aromasin, declined  tamoxifen.  She is BRCA1 mutation carrier and has had contralateral mastectomy and bilateral salpingo-oophorectomy.  She has been cancer free for the past 14 years.   Since last visit, she continues to have the sensitivity and achiness in the right chest wall and wonders if this could be some scar tissue or musculoskeletal but obviously she is concerned given her history of right-sided breast cancer.  Physical examination today status post bilateral mastectomy, no palpable masses concerning for recurrence.  No regional adenopathy. At this time we have discussed about considering an ultrasound of the chest wall and axilla to confirm lack of disease and she is having persistent pain.  Also have recommended a referral to physical therapy to help with mobility since she is status post bilateral mastectomy and no reconstruction.   With regards to her weight loss, we have discussed about considering weight loss clinic, staying active, regular exercise and eating healthy and balanced diet.  She will talk to her PCP about referral to weight loss clinic and [facility].  She apparently heard about Wegovy from some of her friends who have been using it for weight loss as well as  diabetes. She does not have any other concerning review of systems or physical examination findings.  She was of course encouraged to contact us with any new questions or concerns.  She was also encouraged to contact us if she does not hear back from breast center for ultrasound or from physical therapy office.  Return to clinic in 6 months  HISTORY OF PRESENTING ILLNESS:  Shannon Osborne 66 y.o. female is here because of high risk for breast cncer.  This is a very pleasant 66 year old postmenopausal female patient with past medical history significant for right-sided breast cancer stage II, T2 N0 M0 invasive ductal carcinoma status post right mastectomy and sentinel lymph node dissection.  2 specimens present 1 was grade 3, ER negative PR negative and HER2 negative with a high Ki-67 index of 54%, the second mass was grade 3 2% ER positive, PR negative, HER2 negative with Ki-67 at 34%.  She had chemotherapy with TAC x6 completed February 06, 2008.  She was intolerant to Femara and Arimidex and subsequently Aromasin.  She declined a trial of tamoxifen.  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  She was previously treated by Dr. Alben Spittle in [facility], moved to the [facility] and is establishing with [place] given 1 mutation. Her dad had 4 sisters with breast cancer.  There appears to be history of female breast cancer on her maternal side.  Interval History  [name] is here for follow-up.  Since her last visit she denies any new health complaints except for ongoing sensitivity  in the right breast especially when she drinks anything with caffeine.  She also notices some pain in the right breast in the upper outer quadrant close to the axillary tail when she does any particular kind of movement.  She wonders if this is because she is active all the time.  She is of course concerned given her history of  BRCA mutation and breast cancer on the same side.  She otherwise denies any family history of pancreatic cancer.  She is also struggling with weight loss.  She tells me that some of her friends are using some injections to lose weight and have been very successful.  No change in bowel habits otherwise.  No change in urinary habits.  She is expecting a spinal fusion to happen in January of coming year which has been causing her some tingling and numbness in her hands.  Rest of the pertinent 10 point ROS reviewed and negative.  MEDICAL HISTORY:  Past Medical History:  Diagnosis Date   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   Breast cancer ([place]) 2009   BRACA 1 Pos    Chronic midline low back pain without sciatica 02/18/2020   GERD (gastroesophageal reflux disease) 02/18/2020   Hyperlipemia    Multiple drug allergies 02/18/2020   Myalgia due to statin 02/18/2020   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   MRI 05/2018   Osteopenia after menopause 02/11/2021   DEXA 01/2021 lowest T = -1.7 femur; recheck 2-3 years    SURGICAL HISTORY: Past Surgical History:  Procedure Laterality Date   ABDOMINAL HYSTERECTOMY  2002   BREAST SURGERY     right 09/10/2007, left 06/16/2009   FOOT BONE EXCISION     SHOULDER ARTHROTOMY Left 2013   and 06/2015   TONSILLECTOMY  2012    SOCIAL HISTORY: Social History   Socioeconomic History   Marital status: Single    Spouse name: Not on file   Number of children: Not on file   Years of education: Not on file   Highest education level: Not on file  Occupational History   Not on file  Tobacco Use   Smoking status: Never   Smokeless tobacco: Never  Vaping Use   Vaping Use: Never used  Substance and Sexual Activity   Alcohol use: Yes    Comment: 10 drinks a year    Drug use: Never   Sexual activity: Yes    Partners: Male  Other Topics Concern   Not on file  Social History Narrative   Not on file   Social Determinants of Health   Financial Resource  Strain: Low Risk  (02/18/2021)   Overall Financial Resource Strain (CARDIA)    Difficulty of Paying Living Expenses: Not hard at all  Food Insecurity: No Food Insecurity (02/18/2021)   Hunger Vital Sign    Worried About Running Out of Food in the Last Year: Never true    Ran Out of Food in the Last Year: Never true  Transportation Needs: No Transportation Needs (02/18/2021)   PRAPARE - Hydrologist (Medical): No    Lack of Transportation (Non-Medical): No  Physical Activity: Sufficiently Active (02/18/2021)   Exercise Vital Sign    Days of Exercise per Week: 6 days    Minutes of Exercise per Session: 60 min  Stress: No Stress Concern Present (02/18/2021)   [place]    Feeling of Stress : Not at all  Social Connections:  Socially Isolated (02/18/2021)   Social Connection and Isolation Panel [NHANES]    Frequency of Communication with Friends and Family: More than three times a week    Frequency of Social Gatherings with Friends and Family: More than three times a week    Attends Religious Services: Never    Marine scientist or Organizations: No    Attends Archivist Meetings: Never    Marital Status: Never married  Intimate Partner Violence: Not At Risk (02/18/2021)   Humiliation, Afraid, Rape, and Kick questionnaire    Fear of Current or Ex-Partner: No    Emotionally Abused: No    Physically Abused: No    Sexually Abused: No    FAMILY HISTORY: Family History  Problem Relation Age of Onset   Hypertension Mother    Colon polyps Mother    Gallstones Mother    Stroke Father    Hypertension Brother    Gallstones Brother    Hypertension Brother    Gallstones Brother    Hypertension Brother    Gallstones Brother     ALLERGIES:  is allergic to baclofen, cyclobenzaprine, escitalopram oxalate, iodine, rosuvastatin, sertraline, simvastatin, atorvastatin, codeine, contrast media  [iodinated contrast media], hydrochlorothiazide w-triamterene, hydrocodone-acetaminophen, and oxycodone hcl.  MEDICATIONS:  Current Outpatient Medications  Medication Sig Dispense Refill   aspirin EC 81 MG tablet Take 81 mg by mouth daily. Swallow whole.     azelastine (ASTELIN) 0.1 % nasal spray SMARTSIG:1-2 Spray(s) Both Nares 1-2 Times Daily PRN     carisoprodol (SOMA) 350 MG tablet Take 1 tablet (350 mg total) by mouth 3 (three) times daily as needed for muscle spasms. 60 tablet 1   diclofenac Sodium (VOLTAREN) 1 % GEL Apply topically 4 (four) times daily.     ergocalciferol (VITAMIN D2) 1.25 MG (50000 UT) capsule      Evolocumab (REPATHA SURECLICK) 948 MG/ML SOAJ Inject 140 mg into the skin every 14 (fourteen) days. 2 mL 11   ezetimibe (ZETIA) 10 MG tablet TAKE 1 TABLET AT BEDTIME 90 tablet 3   fluticasone (FLONASE) 50 MCG/ACT nasal spray Place into both nostrils daily.     icosapent Ethyl (VASCEPA) 1 g capsule Take 2 capsules (2 g total) by mouth 2 (two) times daily. 360 capsule 1   losartan (COZAAR) 50 MG tablet Take 50 mg by mouth daily.     melatonin 3 MG TABS tablet Take 3 mg by mouth at bedtime as needed.     metoprolol succinate (TOPROL-XL) 25 MG 24 hr tablet Take 25 mg by mouth every morning.     naproxen sodium (ALEVE) 220 MG tablet Take 220 mg by mouth daily as needed.     omeprazole (PRILOSEC) 20 MG capsule Take 1 capsule (20 mg total) by mouth daily. 90 capsule 3   phenazopyridine (PYRIDIUM) 200 MG tablet Take 1 tablet (200 mg total) by mouth 3 (three) times daily as needed for pain. 10 tablet 0   Polyethylene Glycol 3350 (MIRALAX PO) Take by mouth.     traZODone (DESYREL) 50 MG tablet Take by mouth. As needed for sleep     No current facility-administered medications for this visit.     PHYSICAL EXAMINATION: ECOG PERFORMANCE STATUS: 0 - Asymptomatic  Vitals:   06/08/22 0912  BP: 136/78  Pulse: 77  Resp: 18  Temp: (!) 97.2 F (36.2 C)  SpO2: 98%    Filed  Weights   06/08/22 0912  Weight: 143 lb 9 oz (65.1 kg)   GENERAL:alert,  no distress and comfortable Neck: No cervical adenopathy Breast:  Status post bilateral mastectomy.  No concern for recurrence.  No palpable masses or regional adenopathy.  The area of tenderness in the right chest wall is likely musculoskeletal.  LABORATORY DATA:  I have reviewed the data as listed Lab Results  Component Value Date   WBC 4.9 03/02/2022   HGB 11.9 (L) 03/02/2022   HCT 36.1 03/02/2022   MCV 90.5 03/02/2022   PLT 232.0 03/02/2022     Chemistry      Component Value Date/Time   NA 139 03/02/2022 1029   K 4.1 03/02/2022 1029   CL 103 03/02/2022 1029   CO2 30 03/02/2022 1029   BUN 17 03/02/2022 1029   BUN 29 (A) 07/07/2019 0000   CREATININE 0.64 03/02/2022 1029   GLU 89 07/07/2019 0000      Component Value Date/Time   CALCIUM 9.4 03/02/2022 1029   ALKPHOS 67 03/02/2022 1029   AST 19 03/02/2022 1029   ALT 17 03/02/2022 1029   BILITOT 0.6 03/02/2022 1029   BILITOT 0.4 12/09/2021 0754       RADIOGRAPHIC STUDIES: I have personally reviewed the radiological images as listed and agreed with the findings in the report. No results found.  All questions were answered. The patient knows to call the clinic with any problems, questions or concerns. I spent 84mnutes in the care of this patient including H and P, review of records, counseling and coordination of care.     PBenay Pike MD 06/08/2022 9:39 AM

## 2022-06-19 ENCOUNTER — Other Ambulatory Visit: Payer: Self-pay | Admitting: Neurological Surgery

## 2022-06-23 ENCOUNTER — Telehealth: Payer: Self-pay | Admitting: *Deleted

## 2022-06-23 NOTE — Telephone Encounter (Signed)
Called pt to make aware of 11/15 appt at 1:40pm. Pt verbalized understanding.

## 2022-06-27 ENCOUNTER — Other Ambulatory Visit: Payer: Self-pay | Admitting: Family Medicine

## 2022-07-05 ENCOUNTER — Other Ambulatory Visit: Payer: Self-pay | Admitting: Hematology and Oncology

## 2022-07-05 ENCOUNTER — Ambulatory Visit
Admission: RE | Admit: 2022-07-05 | Discharge: 2022-07-05 | Disposition: A | Discharge status: Home / Self Care | Payer: Medicare Other | Source: Ambulatory Visit | Attending: Hematology and Oncology | Admitting: Hematology and Oncology

## 2022-07-05 DIAGNOSIS — Z853 Personal history of malignant neoplasm of breast: Secondary | ICD-10-CM

## 2022-07-05 DIAGNOSIS — R599 Enlarged lymph nodes, unspecified: Secondary | ICD-10-CM

## 2022-07-23 ENCOUNTER — Other Ambulatory Visit: Payer: Self-pay | Admitting: Family Medicine

## 2022-07-23 NOTE — Therapy (Signed)
OUTPATIENT PHYSICAL THERAPY ONCOLOGY EVALUATION  Patient Name: Shannon Osborne MRN: [id number] DOB:1955/09/18, 66 y.o., female Today's Date: 07/24/2022  END OF SESSION:  PT End of Session - 07/24/22 1259     Visit Number 1    Number of Visits 8    Date for PT Re-Evaluation 08/21/22    PT Start Time 1200    PT Stop Time 5784    PT Time Calculation (min) 55 min    Activity Tolerance Patient tolerated treatment well    Behavior During Therapy [facility] for tasks assessed/performed             Past Medical History:  Diagnosis Date   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   Breast cancer ([place]) 2009   BRACA 1 Pos    Chronic midline low back pain without sciatica 02/18/2020   GERD (gastroesophageal reflux disease) 02/18/2020   Hyperlipemia    Multiple drug allergies 02/18/2020   Myalgia due to statin 02/18/2020   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   MRI 05/2018   Osteopenia after menopause 02/11/2021   DEXA 01/2021 lowest T = -1.7 femur; recheck 2-3 years   Past Surgical History:  Procedure Laterality Date   ABDOMINAL HYSTERECTOMY  2002   BREAST SURGERY     right 09/10/2007, left 06/16/2009   FOOT BONE EXCISION     SHOULDER ARTHROTOMY Left 2013   and 06/2015   TONSILLECTOMY  2012   Patient Active Problem List   Diagnosis Date Noted   History of asbestos exposure 03/02/2022   Mild mitral regurgitation 03/02/2022   Atherosclerosis of aorta ([place]) 03/02/2022   Agatston coronary artery calcium score between 200 and 399 03/02/2022   Essential hypertension 07/07/2021   Osteopenia after menopause 02/11/2021   Long-term current use of proton pump inhibitor therapy 11/29/2020   Postherpetic neuralgia 04/28/2020   History of breast cancer 02/18/2020   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   GERD (gastroesophageal reflux disease) 02/18/2020   Myalgia due to statin 02/18/2020   Mixed hyperlipidemia 02/18/2020   Colon polyp 02/18/2020    Multiple drug allergies 02/18/2020   Status post bilateral mastectomy 02/18/2020   Chronic midline low back pain without sciatica 02/18/2020   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   Post-traumatic arthritis of right foot 01/16/2020   Status post shoulder replacement, 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    PCP: Billey Chang, MD  REFERRING PROVIDER: Benay Pike, MD  REFERRING DIAG: s/p Bilateral Mastectomies with Right breast Cancer  THERAPY DIAG:  Aftercare following surgery for neoplasm  Personal history of malignant neoplasm of breast  Stiffness of right shoulder, not elsewhere classified  ONSET DATE: Sept 2023  Rationale for Evaluation and Treatment: Rehabilitation  SUBJECTIVE:  SUBJECTIVE STATEMENT: Pt reports sensitivity and achiness in the right chest wall and had a chest and axillary [place] to rule out re-occurrence. She will return again in 3 months to look for changes again.She notices ongoing sensitivity/achiness in the right breast especially when she drinks anything with caffeine. She also notices tightness in the upper outer breast/axillary region when she does reaching activities. Sometimes she feels like there may be some swelling at the axillary region. She holds her arm above her head and squeezes a ball and the swelling goes it away.  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  She completed adjuvant chemotherapy with TAC, intolerant to Femara Arimidex and Aromasin, declined  tamoxifen.  She is BRCA1 mutation carrier and has had contralateral mastectomy and bilateral salpingo-oophorectomy.   She has been cancer free for the past 14 years.  She has a lumbar laminectomy/fusion scheduled for 08/29/2021. Right breast mastectomy 2009, and left 2010. 0/3 LN's removed on right.   PAIN:  Are you having pain? Yes NPRS scale: 1-2/10 Pain location: Right axillary border of pectorals Pain orientation: Right  PAIN TYPE: aching and tight Pain description: intermittent ,  Aggravating factors: caffeine Relieving factors: not drinking caffeine  PRECAUTIONS: Left TSA 2016, pending lumbar fusion in January, Right breast Cancer (triple Neg), arthrodesis Right Tarsometatarsal 2021,  WEIGHT BEARING RESTRICTIONS: No  FALLS:  Has patient fallen in last 6 months? No  LIVING ENVIRONMENT: Lives with: with significant other Lives in: House/apartment Stairs: Yes; Internal: 15 steps; on right going up,  3 steps to enter home. Has following equipment at home: Single point cane, Walker - 2 wheeled, shower chair, bed side commode, and knee walker  OCCUPATION: retired  Psychologist, clinical: art, Haematologist, gardening, crafts, building things  HAND DOMINANCE: right   PRIOR LEVEL OF FUNCTION: Independent  PATIENT GOALS: what she can do to alleviate discomfort in right axillary region   OBJECTIVE:  COGNITION: Overall cognitive status: Within functional limits for tasks assessed   PALPATION: Mild tenderness axillary border of pectorals, several small cords noted in axilla running to mid upper arm. Mild tenderness right pec minor and right axillary border of pectorals  OBSERVATIONS / OTHER ASSESSMENTS: well healed mastectomy incisions bilaterally,    SENSATION: Light touch: decreased at right lateral trunk   POSTURE: forward head rounded shoulders  UPPER EXTREMITY AROM/PROM:  A/PROM RIGHT   eval   Shoulder extension 56  Shoulder flexion 157 Pulls into upper arm  Shoulder abduction 150 pulls  Shoulder internal rotation 68  Shoulder external rotation 107    (Blank rows = not tested)  A/PROM LEFT    eval  Shoulder extension 41  Shoulder flexion 152  Shoulder abduction  NT Left TSA  Shoulder internal rotation   Shoulder external rotation     (Blank rows = not tested)  CERVICAL AROM: All within functional limits:      UPPER EXTREMITY STRENGTH: WFL  LYMPHEDEMA ASSESSMENTS:   SURGERY TYPE/DATE: Bilateral Mastectomy for right breast Cancer without reconstruction  NUMBER OF LYMPH NODES REMOVED: 0/3  CHEMOTHERAPY: Yes TAC x 6, competed June 2009  RADIATION:No  HORMONE TREATMENT: No  INFECTIONS: NO  LYMPHEDEMA ASSESSMENTS:   LANDMARK RIGHT  eval  10 cm proximal to olecranon process 29.5  Olecranon process 25.3  10 cm proximal to ulnar styloid process 21.1  Just proximal to ulnar styloid process 14.6  Across hand at thumb web space 18.3  At base of 2nd digit 6.25  (Blank rows = not tested)  LANDMARK LEFT  eval  10 cm proximal to olecranon process 29.3  Olecranon process 25  10 cm proximal to ulnar styloid process 20.0  Just proximal to ulnar styloid process 14.8  Across hand at thumb web space 18.1  At base of 2nd digit 6.3  (Blank rows = not tested)  QUICK DASH SURVEY: 9.09%   TODAY'S TREATMENT:                                                                                                                                         DATE: 07/24/2022 Educated pt in beginning HEP including Right UE stargazer, and right UE wall slides for abduction to gently stretch tight areas  PATIENT EDUCATION:  Education details: right UE Writer, wallslides Person educated: Patient Education method: Consulting civil engineer, Demonstration, and Handouts Education comprehension: verbalized understanding and returned demonstration  HOME EXERCISE PROGRAM: Right UE stargazer and standing wall slides  ASSESSMENT:  CLINICAL IMPRESSION: Patient is a 66 y.o. female who was seen today for physical therapy evaluation and treatment for right chest sensitivity/achiness which is worse when  pt drinks caffeine. She notices mild tightness when reaching especially out to the side. She is s/p Right mastectomy with 0/3 LN's in 2009, and left mastectomy in 2010. She is noted to have decreased abduction ROM on the right, in addition to several small cords in the axillary region extending to the mid upper arm. She feels pulling into the arm with reaching activities.  She has mild tenderness at the axillary border of pecs and pec minor.She will benefit from skilled PT to address deficits and return to PLOF .  OBJECTIVE IMPAIRMENTS: decreased ROM, increased fascial restrictions, impaired flexibility, impaired sensation, and pain.   ACTIVITY LIMITATIONS: reach over head  PARTICIPATION LIMITATIONS:  pt does all that is required of her with tightness/ tenderness and mild pain  PERSONAL FACTORS: 3+ comorbidities:  BRCA1 Positive,s/p bilateral mastectomies, chronic LBP and LE pain pending surgery  , left TSA,Right foot T-M arthrodesis are also affecting patient's functional outcome.   REHAB POTENTIAL: Excellent CLINICAL DECISION MAKING: Stable/uncomplicated  EVALUATION COMPLEXITY: Low  GOALS: Goals reviewed with patient? Yes  SHORT TERM GOALS =LONG TERM GOALS: Target date: 08/21/2022  Pt will be independent and compliant with HEP for Right chest /upper body stretching/strengthening Baseline:  Goal status: INITIAL  2.  Pt will have decrease sensitivity/achiness by greater than 50% Baseline:  Goal status: INITIAL  3.  Pt will restore Right shoulder ROM WNL without complaints of pulling/tighness Baseline:  Goal status: INITIAL    PLAN:  PT FREQUENCY: 2x/week  PT DURATION: 4 weeks  PLANNED INTERVENTIONS: Therapeutic exercises, Therapeutic activity, Neuromuscular re-education, Patient/Family education, Self  Care, scar mobilization, Manual therapy, and Re-evaluation  PLAN FOR NEXT SESSION: STM right pectorals, Lats, UT, MFR to cording/chest region, consider cupping prn,  strengthening,update HEP(caution with left TSA)   Claris Pong, PT 07/24/2022, 1:00 PM

## 2022-07-24 ENCOUNTER — Other Ambulatory Visit: Payer: Self-pay

## 2022-07-24 ENCOUNTER — Ambulatory Visit: Payer: Medicare Other | Attending: Hematology and Oncology

## 2022-07-24 DIAGNOSIS — M898X1 Other specified disorders of bone, shoulder: Secondary | ICD-10-CM | POA: Insufficient documentation

## 2022-07-24 DIAGNOSIS — Z9013 Acquired absence of bilateral breasts and nipples: Secondary | ICD-10-CM | POA: Insufficient documentation

## 2022-07-24 DIAGNOSIS — I972 Postmastectomy lymphedema syndrome: Secondary | ICD-10-CM | POA: Insufficient documentation

## 2022-07-24 DIAGNOSIS — M7918 Myalgia, other site: Secondary | ICD-10-CM | POA: Insufficient documentation

## 2022-07-24 DIAGNOSIS — M47896 Other spondylosis, lumbar region: Secondary | ICD-10-CM | POA: Insufficient documentation

## 2022-07-24 DIAGNOSIS — Z853 Personal history of malignant neoplasm of breast: Secondary | ICD-10-CM | POA: Insufficient documentation

## 2022-07-24 DIAGNOSIS — G8929 Other chronic pain: Secondary | ICD-10-CM | POA: Insufficient documentation

## 2022-07-24 DIAGNOSIS — M545 Low back pain, unspecified: Secondary | ICD-10-CM | POA: Diagnosis not present

## 2022-07-24 DIAGNOSIS — Z483 Aftercare following surgery for neoplasm: Secondary | ICD-10-CM

## 2022-07-24 DIAGNOSIS — M25611 Stiffness of right shoulder, not elsewhere classified: Secondary | ICD-10-CM

## 2022-07-25 ENCOUNTER — Ambulatory Visit: Payer: Medicare Other

## 2022-07-25 DIAGNOSIS — Z853 Personal history of malignant neoplasm of breast: Secondary | ICD-10-CM

## 2022-07-25 DIAGNOSIS — M25611 Stiffness of right shoulder, not elsewhere classified: Secondary | ICD-10-CM

## 2022-07-25 DIAGNOSIS — Z483 Aftercare following surgery for neoplasm: Secondary | ICD-10-CM

## 2022-07-25 NOTE — Therapy (Signed)
OUTPATIENT PHYSICAL THERAPY ONCOLOGY TREATMENT  Patient Name: Shannon Osborne MRN: [id number] DOB:[birth year], 66 y.o., female Today's Date: 07/25/2022  END OF SESSION:  PT End of Session - 07/25/22 1600     Visit Number 2    Number of Visits 8    Date for PT Re-Evaluation 08/21/22    PT Start Time 1602    PT Stop Time 1648    PT Time Calculation (min) 46 min    Activity Tolerance Patient tolerated treatment well    Behavior During Therapy [facility] for tasks assessed/performed             Past Medical History:  Diagnosis Date   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   Breast cancer ([place]) 2009   BRACA 1 Pos    Chronic midline low back pain without sciatica 02/18/2020   GERD (gastroesophageal reflux disease) 02/18/2020   Hyperlipemia    Multiple drug allergies 02/18/2020   Myalgia due to statin 02/18/2020   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   MRI 05/2018   Osteopenia after menopause 02/11/2021   DEXA 01/2021 lowest T = -1.7 femur; recheck 2-3 years   Past Surgical History:  Procedure Laterality Date   ABDOMINAL HYSTERECTOMY  2002   BREAST SURGERY     right 09/10/2007, left 06/16/2009   FOOT BONE EXCISION     SHOULDER ARTHROTOMY Left 2013   and 06/2015   TONSILLECTOMY  2012   Patient Active Problem List   Diagnosis Date Noted   History of asbestos exposure 03/02/2022   Mild mitral regurgitation 03/02/2022   Atherosclerosis of aorta ([place]) 03/02/2022   Agatston coronary artery calcium score between 200 and 399 03/02/2022   Essential hypertension 07/07/2021   Osteopenia after menopause 02/11/2021   Long-term current use of proton pump inhibitor therapy 11/29/2020   Postherpetic neuralgia 04/28/2020   History of breast cancer 02/18/2020   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   GERD (gastroesophageal reflux disease) 02/18/2020   Myalgia due to statin 02/18/2020   Mixed hyperlipidemia 02/18/2020   Colon polyp 02/18/2020    Multiple drug allergies 02/18/2020   Status post bilateral mastectomy 02/18/2020   Chronic midline low back pain without sciatica 02/18/2020   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   Post-traumatic arthritis of right foot 01/16/2020   Status post shoulder replacement, 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    PCP: Billey Chang, MD  REFERRING PROVIDER: Benay Pike, MD  REFERRING DIAG: s/p Bilateral Mastectomies with Right breast Cancer  THERAPY DIAG:  Personal history of malignant neoplasm of breast  Aftercare following surgery for neoplasm  Stiffness of right shoulder, not elsewhere classified  ONSET DATE: Sept 2023  Rationale for Evaluation and Treatment: Rehabilitation  SUBJECTIVE:  SUBJECTIVE STATEMENT: I did the exercises. My shoulder was a little sore but I think I slept on it wrong. It feels fine right now. 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  She completed adjuvant chemotherapy with TAC, intolerant to Femara Arimidex and Aromasin, declined  tamoxifen.  She is BRCA1 mutation carrier and has had contralateral mastectomy and bilateral salpingo-oophorectomy.  She has been cancer free for the past 14 years.  She has a lumbar laminectomy/fusion scheduled for 08/29/2021. Right breast mastectomy 2009, and left 2010. 0/3 LN's removed on right.   PAIN:  Are you having pain? Yes NPRS scale: 1-2/10 Pain location: Right axillary border of pectorals Pain orientation: Right  PAIN TYPE: aching and tight Pain description: intermittent ,  Aggravating  factors: caffeine Relieving factors: not drinking caffeine  PRECAUTIONS: Left TSA 2016, pending lumbar fusion in January, Right breast Cancer (triple Neg), arthrodesis Right Tarsometatarsal 2021,  WEIGHT BEARING RESTRICTIONS: No  FALLS:  Has patient fallen in last 6 months? No  LIVING ENVIRONMENT: Lives with: with significant other Lives in: House/apartment Stairs: Yes; Internal: 15 steps; on right going up,  3 steps to enter home. Has following equipment at home: Single point cane, Walker - 2 wheeled, shower chair, bed side commode, and knee walker  OCCUPATION: retired  Psychologist, clinical: art, Haematologist, gardening, crafts, building things  HAND DOMINANCE: right   PRIOR LEVEL OF FUNCTION: Independent  PATIENT GOALS: what she can do to alleviate discomfort in right axillary region   OBJECTIVE:  COGNITION: Overall cognitive status: Within functional limits for tasks assessed   PALPATION: Mild tenderness axillary border of pectorals, several small cords noted in axilla running to mid upper arm. Mild tenderness right pec minor and right axillary border of pectorals  OBSERVATIONS / OTHER ASSESSMENTS: well healed mastectomy incisions bilaterally,    SENSATION: Light touch: decreased at right lateral trunk   POSTURE: forward head rounded shoulders  UPPER EXTREMITY AROM/PROM:  A/PROM RIGHT   eval   Shoulder extension 56  Shoulder flexion 157 Pulls into upper arm  Shoulder abduction 150 pulls  Shoulder internal rotation 68  Shoulder external rotation 107    (Blank rows = not tested)  A/PROM LEFT   eval  Shoulder extension 41  Shoulder flexion 152  Shoulder abduction  NT Left TSA  Shoulder internal rotation   Shoulder external rotation     (Blank rows = not tested)  CERVICAL AROM: All within functional limits:      UPPER EXTREMITY STRENGTH: WFL  LYMPHEDEMA ASSESSMENTS:   SURGERY TYPE/DATE: Bilateral Mastectomy for right breast Cancer without  reconstruction  NUMBER OF LYMPH NODES REMOVED: 0/3  CHEMOTHERAPY: Yes TAC x 6, competed June 2009  RADIATION:No  HORMONE TREATMENT: No  INFECTIONS: NO  LYMPHEDEMA ASSESSMENTS:   LANDMARK RIGHT  eval  10 cm proximal to olecranon process 29.5  Olecranon process 25.3  10 cm proximal to ulnar styloid process 21.1  Just proximal to ulnar styloid process 14.6  Across hand at thumb web space 18.3  At base of 2nd digit 6.25  (Blank rows = not tested)  LANDMARK LEFT  eval  10 cm proximal to olecranon process 29.3  Olecranon process 25  10 cm proximal to ulnar  styloid process 20.0  Just proximal to ulnar styloid process 14.8  Across hand at thumb web space 18.1  At base of 2nd digit 6.3  (Blank rows = not tested)  QUICK DASH SURVEY: 9.09%   TODAY'S TREATMENT:       07/25/2022 Soft tissue mobilization to right UT, pectorals, serratus in supine and in SL to UT, scapular area, lats and serratus all with cocoa butter. MFR to right chest region in various directions MFR to right axillary region of cording Supine wand scaption to stretch pectorals and medial arm x 5. Cautioned pt not to overdo and never to have pain in either shoulder with exercises.                                                                                                                                      DATE: 07/24/2022 Educated pt in beginning HEP including Right UE stargazer, and right UE wall slides for abduction to gently stretch tight areas  PATIENT EDUCATION:  Education details: right UE Writer, wallslides Person educated: Patient Education method: Explanation, Demonstration, and Handouts Education comprehension: verbalized understanding and returned demonstration  HOME EXERCISE PROGRAM: Right UE stargazer and standing wall slides  ASSESSMENT:  CLINICAL IMPRESSION: Pt notices decreased tightness since doing exercises. She is very compliant with HEP. Increased tissue tension noted in  pectorals and lower scapular area. Cording still present in right axillary region.  Pts arm felt fatigued after release techniques.   OBJECTIVE IMPAIRMENTS: decreased ROM, increased fascial restrictions, impaired flexibility, impaired sensation, and pain.   ACTIVITY LIMITATIONS: reach over head  PARTICIPATION LIMITATIONS:  pt does all that is required of her with tightness/ tenderness and mild pain  PERSONAL FACTORS: 3+ comorbidities:  BRCA1 Positive,s/p bilateral mastectomies, chronic LBP and LE pain pending surgery  , left TSA,Right foot T-M arthrodesis are also affecting patient's functional outcome.   REHAB POTENTIAL: Excellent CLINICAL DECISION MAKING: Stable/uncomplicated  EVALUATION COMPLEXITY: Low  GOALS: Goals reviewed with patient? Yes  SHORT TERM GOALS =LONG TERM GOALS: Target date: 08/21/2022  Pt will be independent and compliant with HEP for Right chest /upper body stretching/strengthening Baseline:  Goal status: INITIAL  2.  Pt will have decrease sensitivity/achiness by greater than 50% Baseline:  Goal status: INITIAL  3.  Pt will restore Right shoulder ROM WNL without complaints of pulling/tighness Baseline:  Goal status: INITIAL    PLAN:  PT FREQUENCY: 2x/week  PT DURATION: 4 weeks  PLANNED INTERVENTIONS: Therapeutic exercises, Therapeutic activity, Neuromuscular re-education, Patient/Family education, Self Care, scar mobilization, Manual therapy, and Re-evaluation  PLAN FOR NEXT SESSION: STM right pectorals, Lats, UT, MFR to cording/chest region, consider cupping prn, strengthening,update HEP(caution with left TSA)   Claris Pong, PT 07/25/2022, 4:53 PM

## 2022-07-31 ENCOUNTER — Ambulatory Visit: Payer: Medicare Other

## 2022-07-31 DIAGNOSIS — M25611 Stiffness of right shoulder, not elsewhere classified: Secondary | ICD-10-CM

## 2022-07-31 DIAGNOSIS — Z853 Personal history of malignant neoplasm of breast: Secondary | ICD-10-CM | POA: Diagnosis not present

## 2022-07-31 DIAGNOSIS — Z483 Aftercare following surgery for neoplasm: Secondary | ICD-10-CM

## 2022-07-31 NOTE — Therapy (Signed)
OUTPATIENT PHYSICAL THERAPY ONCOLOGY TREATMENT  Patient Name: Shannon Osborne MRN: [id number] DOB:[birth year], 66 y.o., female Today's Date: 07/31/2022  END OF SESSION:  PT End of Session - 07/31/22 0755     Visit Number 3    Number of Visits 8    Date for PT Re-Evaluation 08/21/22    PT Start Time 0800    PT Stop Time 2395    PT Time Calculation (min) 54 min    Activity Tolerance Patient tolerated treatment well    Behavior During Therapy [facility] for tasks assessed/performed             Past Medical History:  Diagnosis Date   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   Breast cancer ([place]) 2009   BRACA 1 Pos    Chronic midline low back pain without sciatica 02/18/2020   GERD (gastroesophageal reflux disease) 02/18/2020   Hyperlipemia    Multiple drug allergies 02/18/2020   Myalgia due to statin 02/18/2020   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   MRI 05/2018   Osteopenia after menopause 02/11/2021   DEXA 01/2021 lowest T = -1.7 femur; recheck 2-3 years   Past Surgical History:  Procedure Laterality Date   ABDOMINAL HYSTERECTOMY  2002   BREAST SURGERY     right 09/10/2007, left 06/16/2009   FOOT BONE EXCISION     SHOULDER ARTHROTOMY Left 2013   and 06/2015   TONSILLECTOMY  2012   Patient Active Problem List   Diagnosis Date Noted   History of asbestos exposure 03/02/2022   Mild mitral regurgitation 03/02/2022   Atherosclerosis of aorta ([place]) 03/02/2022   Agatston coronary artery calcium score between 200 and 399 03/02/2022   Essential hypertension 07/07/2021   Osteopenia after menopause 02/11/2021   Long-term current use of proton pump inhibitor therapy 11/29/2020   Postherpetic neuralgia 04/28/2020   History of breast cancer 02/18/2020   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   GERD (gastroesophageal reflux disease) 02/18/2020   Myalgia due to statin 02/18/2020   Mixed hyperlipidemia 02/18/2020   Colon polyp 02/18/2020    Multiple drug allergies 02/18/2020   Status post bilateral mastectomy 02/18/2020   Chronic midline low back pain without sciatica 02/18/2020   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   Post-traumatic arthritis of right foot 01/16/2020   Status post shoulder replacement, 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    PCP: Billey Chang, MD  REFERRING PROVIDER: Benay Pike, MD  REFERRING DIAG: s/p Bilateral Mastectomies with Right breast Cancer  THERAPY DIAG:  Personal history of malignant neoplasm of breast  Aftercare following surgery for neoplasm  Stiffness of right shoulder, not elsewhere classified  ONSET DATE: Sept 2023  Rationale for Evaluation and Treatment: Rehabilitation  SUBJECTIVE:  SUBJECTIVE STATEMENT: Did really well until Sunday and I started tightening up. I still feel tender/tight in the upper arm especially at the elbow.  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  She completed adjuvant chemotherapy with TAC, intolerant to Femara Arimidex and Aromasin, declined  tamoxifen.  She is BRCA1 mutation carrier and has had contralateral mastectomy and bilateral salpingo-oophorectomy.  She has been cancer free for the past 14 years.  She has a lumbar laminectomy/fusion scheduled for 08/29/2021. Right breast mastectomy 2009, and left 2010. 0/3 LN's removed on right.   PAIN:  Are you having pain? Yes NPRS scale: 1-2/10 Pain location: Right axillary border of pectorals Pain orientation: Right  PAIN TYPE: aching and tight Pain description: intermittent ,  Aggravating factors: caffeine Relieving  factors: not drinking caffeine  PRECAUTIONS: Left TSA 2016, pending lumbar fusion in January, Right breast Cancer (triple Neg), arthrodesis Right Tarsometatarsal 2021,  WEIGHT BEARING RESTRICTIONS: No  FALLS:  Has patient fallen in last 6 months? No  LIVING ENVIRONMENT: Lives with: with significant other Lives in: House/apartment Stairs: Yes; Internal: 15 steps; on right going up,  3 steps to enter home. Has following equipment at home: Single point cane, Walker - 2 wheeled, shower chair, bed side commode, and knee walker  OCCUPATION: retired  Psychologist, clinical: art, Haematologist, gardening, crafts, building things  HAND DOMINANCE: right   PRIOR LEVEL OF FUNCTION: Independent  PATIENT GOALS: what she can do to alleviate discomfort in right axillary region   OBJECTIVE:  COGNITION: Overall cognitive status: Within functional limits for tasks assessed   PALPATION: Mild tenderness axillary border of pectorals, several small cords noted in axilla running to mid upper arm. Mild tenderness right pec minor and right axillary border of pectorals  OBSERVATIONS / OTHER ASSESSMENTS: well healed mastectomy incisions bilaterally,    SENSATION: Light touch: decreased at right lateral trunk   POSTURE: forward head rounded shoulders  UPPER EXTREMITY AROM/PROM:  A/PROM RIGHT   eval   Shoulder extension 56  Shoulder flexion 157 Pulls into upper arm  Shoulder abduction 150 pulls  Shoulder internal rotation 68  Shoulder external rotation 107    (Blank rows = not tested)  A/PROM LEFT   eval  Shoulder extension 41  Shoulder flexion 152  Shoulder abduction  NT Left TSA  Shoulder internal rotation   Shoulder external rotation     (Blank rows = not tested)  CERVICAL AROM: All within functional limits:      UPPER EXTREMITY STRENGTH: WFL  LYMPHEDEMA ASSESSMENTS:   SURGERY TYPE/DATE: Bilateral Mastectomy for right breast Cancer without reconstruction  NUMBER OF LYMPH NODES REMOVED:  0/3  CHEMOTHERAPY: Yes TAC x 6, competed June 2009  RADIATION:No  HORMONE TREATMENT: No  INFECTIONS: NO  LYMPHEDEMA ASSESSMENTS:   LANDMARK RIGHT  eval  10 cm proximal to olecranon process 29.5  Olecranon process 25.3  10 cm proximal to ulnar styloid process 21.1  Just proximal to ulnar styloid process 14.6  Across hand at thumb web space 18.3  At base of 2nd digit 6.25  (Blank rows = not tested)  LANDMARK LEFT  eval  10 cm proximal to olecranon process 29.3  Olecranon process 25  10 cm proximal to ulnar styloid process 20.0  Just proximal to ulnar styloid  process 14.8  Across hand at thumb web space 18.1  At base of 2nd digit 6.3  (Blank rows = not tested)  QUICK DASH SURVEY: 9.09%   TODAY'S TREATMENT:    07/31/2022 Pulleys shoulder flexion x 2 min, scaption 1 min, abd x 2 min to assist ROM Soft tissue mobilization to right UT, pectorals, serratus in supine and in SL to UT, scapular area, lats and serratus all with cocoa butter. MFR to right chest region in various directions MFR to right axillary region and elbow region of cording during PROM of right shoulder flexion, scaption, abduction Right UE supine ER stabilizing with left and Right UE horizontal abd both with yellow x 10 Updated HEP      07/25/2022 Soft tissue mobilization to right UT, pectorals, serratus in supine and in SL to UT, scapular area, lats and serratus all with cocoa butter. MFR to right chest region in various directions MFR to right axillary region of cording Supine wand scaption to stretch pectorals and medial arm x 5. Cautioned pt not to overdo and never to have pain in either shoulder with exercises.                                                                                                                                      DATE: 07/24/2022 Educated pt in beginning HEP including Right UE stargazer, and right UE wall slides for abduction to gently stretch tight areas  PATIENT  EDUCATION:  Education details: right UE Writer, wallslides Person educated: Patient Education method: Explanation, Demonstration, and Handouts Education comprehension: verbalized understanding and returned demonstration  HOME EXERCISE PROGRAM: Right UE stargazer and standing wall slides  ASSESSMENT:  CLINICAL IMPRESSION: Pt with increased tissue tension in right pectorals and scapular region with trigger point in scapular region referring to her ear. Cording with increased tightness/tenderness today. Loosens up after manual work.  OBJECTIVE IMPAIRMENTS: decreased ROM, increased fascial restrictions, impaired flexibility, impaired sensation, and pain.   ACTIVITY LIMITATIONS: reach over head  PARTICIPATION LIMITATIONS:  pt does all that is required of her with tightness/ tenderness and mild pain  PERSONAL FACTORS: 3+ comorbidities:  BRCA1 Positive,s/p bilateral mastectomies, chronic LBP and LE pain pending surgery  , left TSA,Right foot T-M arthrodesis are also affecting patient's functional outcome.   REHAB POTENTIAL: Excellent CLINICAL DECISION MAKING: Stable/uncomplicated  EVALUATION COMPLEXITY: Low  GOALS: Goals reviewed with patient? Yes  SHORT TERM GOALS =LONG TERM GOALS: Target date: 08/21/2022  Pt will be independent and compliant with HEP for Right chest /upper body stretching/strengthening Baseline:  Goal status: INITIAL  2.  Pt will have decrease sensitivity/achiness by greater than 50% Baseline:  Goal status: INITIAL  3.  Pt will restore Right shoulder ROM WNL without complaints of pulling/tighness Baseline:  Goal status: INITIAL    PLAN:  PT FREQUENCY: 2x/week  PT DURATION: 4 weeks  PLANNED INTERVENTIONS: Therapeutic exercises, Therapeutic activity, Neuromuscular re-education,  Patient/Family education, Self Care, scar mobilization, Manual therapy, and Re-evaluation  PLAN FOR NEXT SESSION: STM right pectorals, Lats, UT, MFR to cording/chest region,  consider cupping prn, review UE band ER, HA with Right UE, strengthening,update HEP(caution with left TSA)   Claris Pong, PT 07/31/2022, 8:56 AM

## 2022-07-31 NOTE — Patient Instructions (Addendum)
Axillary web syndrome (also called cording) can happen after having breast cancer surgery when lymph nodes in the armpit are removed. It presents as if you have a thin cord in your arm and can run from the armpit all the way down into the forearm. If you've had a sentinel node biopsy, the risk is 1-20% and if you've had an axillary lymph node dissection (more than 7 nodes removed), the risk is 36-72%. The ranges vary depending on the research study.  It most often happens 3-4 weeks post-op but can happen sooner or later. There are several possibilities for what cording actually is. Although no one knows for sure as of yet, it may be related to lymphatics, veins, or other tissue. Sometimes cording resolves on its own but other times it requires physical therapy with a therapist who specializes in lymphedema and/or cancer rehab. Treatment typically involves stretching, manual techniques, and exercise. Sometimes cords get "released" while stretching or during manual treatment and the patient may experience the sensation of a "pop." This may feel strange but it is not dangerous and is a sign that the cord has released; range of motion may be improved in the process.   Side Pull: Double Arm   On back, knees bent, feet flat. Arms perpendicular to body, shoulder level, elbows straight but relaxed. Pull arms out to sides, elbows straight. Resistance band comes across collarbones, hands toward floor. Hold momentarily. Slowly return to starting position. Repeat _5-10__ times. Band color _yellow____   Shoulder Rotation: Double Arm   On back, knees bent, feet flat, elbows tucked at sides, bent 90, hands palms up. Pull hands apart and down toward floor, keeping elbows near sides. Hold momentarily. Slowly return to starting position. Repeat _5-10__ times. Band color __yellow____

## 2022-08-03 ENCOUNTER — Ambulatory Visit: Payer: Medicare Other

## 2022-08-03 ENCOUNTER — Encounter: Payer: Self-pay | Admitting: *Deleted

## 2022-08-03 DIAGNOSIS — Z483 Aftercare following surgery for neoplasm: Secondary | ICD-10-CM

## 2022-08-03 DIAGNOSIS — M25611 Stiffness of right shoulder, not elsewhere classified: Secondary | ICD-10-CM

## 2022-08-03 DIAGNOSIS — Z853 Personal history of malignant neoplasm of breast: Secondary | ICD-10-CM | POA: Diagnosis not present

## 2022-08-03 NOTE — Therapy (Signed)
OUTPATIENT PHYSICAL THERAPY ONCOLOGY TREATMENT  Patient Name: Shannon Osborne MRN: [id number] DOB:[birth year], 66 y.o., female Today's Date: 08/03/2022  END OF SESSION:  PT End of Session - 08/03/22 1052     Visit Number 4    Number of Visits 8    Date for PT Re-Evaluation 08/21/22    PT Start Time 1055    PT Stop Time 3267    PT Time Calculation (min) 57 min    Activity Tolerance Patient tolerated treatment well    Behavior During Therapy [facility] for tasks assessed/performed             Past Medical History:  Diagnosis Date   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   Breast cancer ([place]) 2009   BRACA 1 Pos    Chronic midline low back pain without sciatica 02/18/2020   GERD (gastroesophageal reflux disease) 02/18/2020   Hyperlipemia    Multiple drug allergies 02/18/2020   Myalgia due to statin 02/18/2020   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   MRI 05/2018   Osteopenia after menopause 02/11/2021   DEXA 01/2021 lowest T = -1.7 femur; recheck 2-3 years   Past Surgical History:  Procedure Laterality Date   ABDOMINAL HYSTERECTOMY  2002   BREAST SURGERY     right 09/10/2007, left 06/16/2009   FOOT BONE EXCISION     SHOULDER ARTHROTOMY Left 2013   and 06/2015   TONSILLECTOMY  2012   Patient Active Problem List   Diagnosis Date Noted   History of asbestos exposure 03/02/2022   Mild mitral regurgitation 03/02/2022   Atherosclerosis of aorta ([place]) 03/02/2022   Agatston coronary artery calcium score between 200 and 399 03/02/2022   Essential hypertension 07/07/2021   Osteopenia after menopause 02/11/2021   Long-term current use of proton pump inhibitor therapy 11/29/2020   Postherpetic neuralgia 04/28/2020   History of breast cancer 02/18/2020   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   GERD (gastroesophageal reflux disease) 02/18/2020   Myalgia due to statin 02/18/2020   Mixed hyperlipidemia 02/18/2020   Colon polyp 02/18/2020    Multiple drug allergies 02/18/2020   Status post bilateral mastectomy 02/18/2020   Chronic midline low back pain without sciatica 02/18/2020   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   Post-traumatic arthritis of right foot 01/16/2020   Status post shoulder replacement, 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    PCP: Billey Chang, MD  REFERRING PROVIDER: Benay Pike, MD  REFERRING DIAG: s/p Bilateral Mastectomies with Right breast Cancer  THERAPY DIAG:  Personal history of malignant neoplasm of breast  Aftercare following surgery for neoplasm  Stiffness of right shoulder, not elsewhere classified  ONSET DATE: Sept 2023  Rationale for Evaluation and Treatment: Rehabilitation  SUBJECTIVE:  SUBJECTIVE STATEMENT:  I felt great on Tuesday and did pretty well with a long drive yesterday.  Feel a little tight in the pectorals right now. I have been using the pulleys at home and they loosened me up some.    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  She completed adjuvant chemotherapy with TAC, intolerant to Femara Arimidex and Aromasin, declined  tamoxifen.  She is BRCA1 mutation carrier and has had contralateral mastectomy and bilateral salpingo-oophorectomy.  She has been cancer free for the past 14 years.  She has a lumbar laminectomy/fusion scheduled for 08/29/2021. Right breast mastectomy 2009, and left 2010. 0/3 LN's removed on right.   PAIN:  Are you having pain? No just tight NPRS scale: 0/10 just discomfort today Pain location: Right axillary border of pectorals Pain orientation: Right  PAIN TYPE:  aching and tight Pain description: intermittent ,  Aggravating factors: caffeine Relieving factors: not drinking caffeine  PRECAUTIONS: Left TSA 2016, pending lumbar fusion in January, Right breast Cancer (triple Neg), arthrodesis Right Tarsometatarsal 2021,  WEIGHT BEARING RESTRICTIONS: No  FALLS:  Has patient fallen in last 6 months? No  LIVING ENVIRONMENT: Lives with: with significant other Lives in: House/apartment Stairs: Yes; Internal: 15 steps; on right going up,  3 steps to enter home. Has following equipment at home: Single point cane, Walker - 2 wheeled, shower chair, bed side commode, and knee walker  OCCUPATION: retired  Psychologist, clinical: art, Haematologist, gardening, crafts, building things  HAND DOMINANCE: right   PRIOR LEVEL OF FUNCTION: Independent  PATIENT GOALS: what she can do to alleviate discomfort in right axillary region   OBJECTIVE:  COGNITION: Overall cognitive status: Within functional limits for tasks assessed   PALPATION: Mild tenderness axillary border of pectorals, several small cords noted in axilla running to mid upper arm. Mild tenderness right pec minor and right axillary border of pectorals  OBSERVATIONS / OTHER ASSESSMENTS: well healed mastectomy incisions bilaterally,    SENSATION: Light touch: decreased at right lateral trunk   POSTURE: forward head rounded shoulders  UPPER EXTREMITY AROM/PROM:  A/PROM RIGHT   eval   Shoulder extension 56  Shoulder flexion 157 Pulls into upper arm  Shoulder abduction 150 pulls  Shoulder internal rotation 68  Shoulder external rotation 107    (Blank rows = not tested)  A/PROM LEFT   eval  Shoulder extension 41  Shoulder flexion 152  Shoulder abduction  NT Left TSA  Shoulder internal rotation   Shoulder external rotation     (Blank rows = not tested)  CERVICAL AROM: All within functional limits:      UPPER EXTREMITY STRENGTH: WFL  LYMPHEDEMA ASSESSMENTS:   SURGERY TYPE/DATE: Bilateral  Mastectomy for right breast Cancer without reconstruction  NUMBER OF LYMPH NODES REMOVED: 0/3  CHEMOTHERAPY: Yes TAC x 6, competed June 2009  RADIATION:No  HORMONE TREATMENT: No  INFECTIONS: NO  LYMPHEDEMA ASSESSMENTS:   LANDMARK RIGHT  eval  10 cm proximal to olecranon process 29.5  Olecranon process 25.3  10 cm proximal to ulnar styloid process 21.1  Just proximal to ulnar styloid process 14.6  Across hand at thumb web space 18.3  At base of 2nd digit 6.25  (Blank rows = not tested)  LANDMARK LEFT  eval  10 cm  proximal to olecranon process 29.3  Olecranon process 25  10 cm proximal to ulnar styloid process 20.0  Just proximal to ulnar styloid process 14.8  Across hand at thumb web space 18.1  At base of 2nd digit 6.3  (Blank rows = not tested)  QUICK DASH SURVEY: 9.09%   TODAY'S TREATMENT:    08/03/2022 Soft tissue mobilization to right UT, pectorals, serratus in supine and in SL to UT, scapular area, lats and serratus all with cocoa butter. MFR to right chest region in various directions Cording release to axillary region and right UE PROM right shoulder flexion , scaption abduction with release techniques for cording Supine AROM flexion, scaption, horizontal abd x 5 Supine single arm horizontal abd and ER x 8 with yellow.   07/31/2022 Pulleys shoulder flexion x 2 min, scaption 1 min, abd x 2 min to assist ROM Soft tissue mobilization to right UT, pectorals, serratus in supine and in SL to UT, scapular area, lats and serratus all with cocoa butter. MFR to right chest region in various directions MFR to right axillary region and elbow region of cording during PROM of right shoulder flexion, scaption, abduction Right UE supine ER stabilizing with left and Right UE horizontal abd both with yellow x 10 Updated HEP      07/25/2022 Soft tissue mobilization to right UT, pectorals, serratus in supine and in SL to UT, scapular area, lats and serratus all with cocoa  butter. MFR to right chest region in various directions MFR to right axillary region of cording Supine wand scaption to stretch pectorals and medial arm x 5. Cautioned pt not to overdo and never to have pain in either shoulder with exercises.                                                                                                                                      DATE: 07/24/2022 Educated pt in beginning HEP including Right UE stargazer, and right UE wall slides for abduction to gently stretch tight areas  PATIENT EDUCATION:  Education details: right UE Writer, wallslides Person educated: Patient Education method: Explanation, Demonstration, and Handouts Education comprehension: verbalized understanding and returned demonstration  HOME EXERCISE PROGRAM: Right UE stargazer and standing wall slides  ASSESSMENT:  CLINICAL IMPRESSION: Pt with increased tissue tension in right pectorals and scapular region  with tender points in each. Cording persists. Pt felt better after treatment. Discussed DN with pt and she would like to try if appts are available for the next 2 weeks.   OBJECTIVE IMPAIRMENTS: decreased ROM, increased fascial restrictions, impaired flexibility, impaired sensation, and pain.   ACTIVITY LIMITATIONS: reach over head  PARTICIPATION LIMITATIONS:  pt does all that is required of her with tightness/ tenderness and mild pain  PERSONAL FACTORS: 3+ comorbidities:  BRCA1 Positive,s/p bilateral mastectomies, chronic LBP and LE pain pending surgery  , left TSA,Right foot T-M arthrodesis are also affecting  patient's functional outcome.   REHAB POTENTIAL: Excellent CLINICAL DECISION MAKING: Stable/uncomplicated  EVALUATION COMPLEXITY: Low  GOALS: Goals reviewed with patient? Yes  SHORT TERM GOALS =LONG TERM GOALS: Target date: 08/21/2022  Pt will be independent and compliant with HEP for Right chest /upper body stretching/strengthening Baseline:  Goal status:  INITIAL  2.  Pt will have decrease sensitivity/achiness by greater than 50% Baseline:  Goal status: INITIAL  3.  Pt will restore Right shoulder ROM WNL without complaints of pulling/tighness Baseline:  Goal status: INITIAL    PLAN:  PT FREQUENCY: 2x/week  PT DURATION: 4 weeks  PLANNED INTERVENTIONS: Therapeutic exercises, Therapeutic activity, Neuromuscular re-education, Patient/Family education, Self Care, scar mobilization, Manual therapy, and Re-evaluation, Dry needling  PLAN FOR NEXT SESSION: STM right pectorals, Lats, UT, MFR to cording/chest region, consider cupping prn, review UE band ER, HA with Right UE, strengthening,update HEP(caution with left TSA)   Claris Pong, PT 08/03/2022, 11:55 AM

## 2022-08-03 NOTE — Addendum Note (Signed)
Addended by: Claris Pong on: 08/03/2022 12:56 PM   Modules accepted: Orders

## 2022-08-07 ENCOUNTER — Ambulatory Visit: Payer: Medicare Other

## 2022-08-07 DIAGNOSIS — M25611 Stiffness of right shoulder, not elsewhere classified: Secondary | ICD-10-CM

## 2022-08-07 DIAGNOSIS — Z853 Personal history of malignant neoplasm of breast: Secondary | ICD-10-CM | POA: Diagnosis not present

## 2022-08-07 DIAGNOSIS — Z483 Aftercare following surgery for neoplasm: Secondary | ICD-10-CM

## 2022-08-07 NOTE — Therapy (Signed)
OUTPATIENT PHYSICAL THERAPY ONCOLOGY TREATMENT  Patient Name: Shannon Osborne MRN: [id number] DOB:1955-12-01, 65 y.o., female Today's Date: 08/07/2022  END OF SESSION:  PT End of Session - 08/07/22 0859     Visit Number 5    Number of Visits 8    Date for PT Re-Evaluation 08/21/22    PT Start Time 0900    PT Stop Time 6606    PT Time Calculation (min) 49 min    Activity Tolerance Patient tolerated treatment well    Behavior During Therapy [facility] for tasks assessed/performed             Past Medical History:  Diagnosis Date   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   Breast cancer ([place]) 2009   BRACA 1 Pos    Chronic midline low back pain without sciatica 02/18/2020   GERD (gastroesophageal reflux disease) 02/18/2020   Hyperlipemia    Multiple drug allergies 02/18/2020   Myalgia due to statin 02/18/2020   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   MRI 05/2018   Osteopenia after menopause 02/11/2021   DEXA 01/2021 lowest T = -1.7 femur; recheck 2-3 years   Past Surgical History:  Procedure Laterality Date   ABDOMINAL HYSTERECTOMY  2002   BREAST SURGERY     right 09/10/2007, left 06/16/2009   FOOT BONE EXCISION     SHOULDER ARTHROTOMY Left 2013   and 06/2015   TONSILLECTOMY  2012   Patient Active Problem List   Diagnosis Date Noted   History of asbestos exposure 03/02/2022   Mild mitral regurgitation 03/02/2022   Atherosclerosis of aorta ([place]) 03/02/2022   Agatston coronary artery calcium score between 200 and 399 03/02/2022   Essential hypertension 07/07/2021   Osteopenia after menopause 02/11/2021   Long-term current use of proton pump inhibitor therapy 11/29/2020   Postherpetic neuralgia 04/28/2020   History of breast cancer 02/18/2020   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   GERD (gastroesophageal reflux disease) 02/18/2020   Myalgia due to statin 02/18/2020   Mixed hyperlipidemia 02/18/2020   Colon polyp 02/18/2020    Multiple drug allergies 02/18/2020   Status post bilateral mastectomy 02/18/2020   Chronic midline low back pain without sciatica 02/18/2020   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   Post-traumatic arthritis of right foot 01/16/2020   Status post shoulder replacement, 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    PCP: Billey Chang, MD  REFERRING PROVIDER: Benay Pike, MD  REFERRING DIAG: s/p Bilateral Mastectomies with Right breast Cancer  THERAPY DIAG:  Personal history of malignant neoplasm of breast  Aftercare following surgery for neoplasm  Stiffness of right shoulder, not elsewhere classified  ONSET DATE: Sept 2023  Rationale for Evaluation and Treatment: Rehabilitation  SUBJECTIVE:  SUBJECTIVE STATEMENT: I am doing well. It started to tighten up a little, but it is doing better. Still a little tender in pectorals but not pain. I had a massage over the weekend and it felt so good.   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  She completed adjuvant chemotherapy with TAC, intolerant to Femara Arimidex and Aromasin, declined  tamoxifen.  She is BRCA1 mutation carrier and has had contralateral mastectomy and bilateral salpingo-oophorectomy.  She has been cancer free for the past 14 years.  She has a lumbar laminectomy/fusion scheduled for 08/29/2021. Right breast mastectomy 2009, and left 2010. 0/3 LN's removed on right.   PAIN:  Are you having pain? No just tight NPRS scale: 0/10 just discomfort today Pain location: Right axillary border of pectorals Pain orientation: Right  PAIN TYPE: aching and  tight Pain description: intermittent ,  Aggravating factors: caffeine Relieving factors: not drinking caffeine  PRECAUTIONS: Left TSA 2016, pending lumbar fusion in January, Right breast Cancer (triple Neg), arthrodesis Right Tarsometatarsal 2021,  WEIGHT BEARING RESTRICTIONS: No  FALLS:  Has patient fallen in last 6 months? No  LIVING ENVIRONMENT: Lives with: with significant other Lives in: House/apartment Stairs: Yes; Internal: 15 steps; on right going up,  3 steps to enter home. Has following equipment at home: Single point cane, Walker - 2 wheeled, shower chair, bed side commode, and knee walker  OCCUPATION: retired  Psychologist, clinical: art, Haematologist, gardening, crafts, building things  HAND DOMINANCE: right   PRIOR LEVEL OF FUNCTION: Independent  PATIENT GOALS: what she can do to alleviate discomfort in right axillary region   OBJECTIVE:  COGNITION: Overall cognitive status: Within functional limits for tasks assessed   PALPATION: Mild tenderness axillary border of pectorals, several small cords noted in axilla running to mid upper arm. Mild tenderness right pec minor and right axillary border of pectorals  OBSERVATIONS / OTHER ASSESSMENTS: well healed mastectomy incisions bilaterally,    SENSATION: Light touch: decreased at right lateral trunk   POSTURE: forward head rounded shoulders  UPPER EXTREMITY AROM/PROM:  A/PROM RIGHT   eval  Right 08/07/2022  Shoulder extension 56 55  Shoulder flexion 157 Pulls into upper arm 157  Shoulder abduction 150 pulls 168  Shoulder internal rotation 68   Shoulder external rotation 107     (Blank rows = not tested)  A/PROM LEFT   eval  Shoulder extension 41  Shoulder flexion 152  Shoulder abduction  NT Left TSA  Shoulder internal rotation   Shoulder external rotation     (Blank rows = not tested)  CERVICAL AROM: All within functional limits:      UPPER EXTREMITY STRENGTH: WFL  LYMPHEDEMA ASSESSMENTS:   SURGERY  TYPE/DATE: Bilateral Mastectomy for right breast Cancer without reconstruction  NUMBER OF LYMPH NODES REMOVED: 0/3  CHEMOTHERAPY: Yes TAC x 6, competed June 2009  RADIATION:No  HORMONE TREATMENT: No  INFECTIONS: NO  LYMPHEDEMA ASSESSMENTS:   LANDMARK RIGHT  eval  10 cm proximal to olecranon process 29.5  Olecranon process 25.3  10 cm proximal to ulnar styloid process 21.1  Just proximal to ulnar styloid process 14.6  Across hand at thumb web space 18.3  At base of 2nd digit 6.25  (Blank rows = not tested)  LANDMARK LEFT  eval  10 cm proximal to olecranon process 29.3  Olecranon process 25  10 cm proximal to ulnar styloid process 20.0  Just proximal to ulnar styloid process 14.8  Across hand at thumb web space 18.1  At base of 2nd digit 6.3  (Blank rows = not tested)  QUICK DASH SURVEY: 9.09%   TODAY'S TREATMENT:    08/07/2022  Pulleys for shoulder flexion and abduction x 1:30 Measured Shoulder ROM Soft tissue mobilization to right UT, pectorals, serratus in supine and in SL to UT, scapular area, lats and serratus all with cocoa butter. MFR to right chest region in various directions Cording release to axillary region and right UE PROM right shoulder flexion , scaption abduction with release techniques for cording Ball rolls on wall 4 D x 15 Standing scapular retraction and shoulder extension red band x 10   08/03/2022 Soft tissue mobilization to right UT, pectorals, serratus in supine and in SL to UT, scapular area, lats and serratus all with cocoa butter. MFR to right chest region in various directions Cording release to axillary region and right UE PROM right shoulder flexion , scaption abduction with release techniques for cording Supine AROM flexion, scaption, horizontal abd x 5 Supine single arm horizontal abd and ER x 8 with yellow.   07/31/2022 Pulleys shoulder flexion x 2 min, scaption 1 min, abd x 2 min to assist ROM Soft tissue mobilization to  right UT, pectorals, serratus in supine and in SL to UT, scapular area, lats and serratus all with cocoa butter. MFR to right chest region in various directions MFR to right axillary region and elbow region of cording during PROM of right shoulder flexion, scaption, abduction Right UE supine ER stabilizing with left and Right UE horizontal abd both with yellow x 10 Updated HEP      07/25/2022 Soft tissue mobilization to right UT, pectorals, serratus in supine and in SL to UT, scapular area, lats and serratus all with cocoa butter. MFR to right chest region in various directions MFR to right axillary region of cording Supine wand scaption to stretch pectorals and medial arm x 5. Cautioned pt not to overdo and never to have pain in either shoulder with exercises.                                                                                                                                      DATE: 07/24/2022 Educated pt in beginning HEP including Right UE stargazer, and right UE wall slides for abduction to gently stretch tight areas  PATIENT EDUCATION:  Education details: right UE Writer, wallslides Person educated: Patient Education method: Explanation, Demonstration, and Handouts Education comprehension: verbalized understanding and returned demonstration  HOME EXERCISE PROGRAM: Right UE stargazer and standing wall slides  ASSESSMENT:  CLINICAL IMPRESSION: Pt has made good improvement in right shoulder ROM for abduction. Cording is still present, but does not appear to limit  ROM anymore. She does still feel tightness with release techniques.  OBJECTIVE IMPAIRMENTS: decreased ROM, increased fascial restrictions, impaired flexibility, impaired sensation, and pain.   ACTIVITY LIMITATIONS: reach over head  PARTICIPATION LIMITATIONS:  pt does all that is required of her with tightness/ tenderness and mild pain  PERSONAL FACTORS: 3+ comorbidities:  BRCA1 Positive,s/p bilateral  mastectomies, chronic LBP and LE pain pending surgery  , left TSA,Right foot T-M arthrodesis are also affecting patient's functional outcome.   REHAB POTENTIAL: Excellent CLINICAL DECISION MAKING: Stable/uncomplicated  EVALUATION COMPLEXITY: Low  GOALS: Goals reviewed with patient? Yes  SHORT TERM GOALS =LONG TERM GOALS: Target date: 08/21/2022  Pt will be independent and compliant with HEP for Right chest /upper body stretching/strengthening Baseline:  Goal status:MET 10/18/2021  2.  Pt will have decrease sensitivity/achiness by greater than 50% Baseline:  Goal status: INITIAL  3.  Pt will restore Right shoulder ROM WNL without complaints of pulling/tighness Baseline:  Goal status: INITIAL    PLAN:  PT FREQUENCY: 2x/week  PT DURATION: 4 weeks  PLANNED INTERVENTIONS: Therapeutic exercises, Therapeutic activity, Neuromuscular re-education, Patient/Family education, Self Care, scar mobilization, Manual therapy, and Re-evaluation, Dry needling  PLAN FOR NEXT SESSION:DN pecs/scapular, STM right pectorals, Lats, UT, MFR to cording/chest region, consider cupping prn, review UE band ER, HA with Right UE, strengthening,update HEP(caution with left TSA)   Claris Pong, PT 08/07/2022, 9:52 AM

## 2022-08-09 ENCOUNTER — Ambulatory Visit: Payer: Medicare Other | Admitting: Physical Therapy

## 2022-08-09 ENCOUNTER — Telehealth: Payer: Self-pay | Admitting: Family Medicine

## 2022-08-09 DIAGNOSIS — M25611 Stiffness of right shoulder, not elsewhere classified: Secondary | ICD-10-CM

## 2022-08-09 DIAGNOSIS — Z853 Personal history of malignant neoplasm of breast: Secondary | ICD-10-CM

## 2022-08-09 NOTE — Patient Instructions (Signed)

## 2022-08-09 NOTE — Therapy (Signed)
OUTPATIENT PHYSICAL THERAPY ONCOLOGY TREATMENT  Patient Name: Shannon Osborne MRN: [id number] DOB:1956/04/04, 66 y.o., female Today's Date: 08/09/2022  END OF SESSION:  PT End of Session - 08/09/22 1140     Visit Number 6    Number of Visits 8    Date for PT Re-Evaluation 08/21/22    PT Start Time 8264    PT Stop Time 1224    PT Time Calculation (min) 39 min    Activity Tolerance Patient tolerated treatment well             Past Medical History:  Diagnosis Date   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   Breast cancer ([place]) 2009   BRACA 1 Pos    Chronic midline low back pain without sciatica 02/18/2020   GERD (gastroesophageal reflux disease) 02/18/2020   Hyperlipemia    Multiple drug allergies 02/18/2020   Myalgia due to statin 02/18/2020   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   MRI 05/2018   Osteopenia after menopause 02/11/2021   DEXA 01/2021 lowest T = -1.7 femur; recheck 2-3 years   Past Surgical History:  Procedure Laterality Date   ABDOMINAL HYSTERECTOMY  2002   BREAST SURGERY     right 09/10/2007, left 06/16/2009   FOOT BONE EXCISION     SHOULDER ARTHROTOMY Left 2013   and 06/2015   TONSILLECTOMY  2012   Patient Active Problem List   Diagnosis Date Noted   History of asbestos exposure 03/02/2022   Mild mitral regurgitation 03/02/2022   Atherosclerosis of aorta ([place]) 03/02/2022   Agatston coronary artery calcium score between 200 and 399 03/02/2022   Essential hypertension 07/07/2021   Osteopenia after menopause 02/11/2021   Long-term current use of proton pump inhibitor therapy 11/29/2020   Postherpetic neuralgia 04/28/2020   History of breast cancer 02/18/2020   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   GERD (gastroesophageal reflux disease) 02/18/2020   Myalgia due to statin 02/18/2020   Mixed hyperlipidemia 02/18/2020   Colon polyp 02/18/2020   Multiple drug allergies 02/18/2020   Status post bilateral  mastectomy 02/18/2020   Chronic midline low back pain without sciatica 02/18/2020   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   Post-traumatic arthritis of right foot 01/16/2020   Status post shoulder replacement, 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    PCP: Billey Chang, MD  REFERRING PROVIDER: Benay Pike, MD  REFERRING DIAG: s/p Bilateral Mastectomies with Right breast Cancer  THERAPY DIAG:  Stiffness of right shoulder, not elsewhere classified  Personal history of malignant neoplasm of breast  ONSET DATE: Sept 2023  Rationale for Evaluation and Treatment: Rehabilitation  SUBJECTIVE:  SUBJECTIVE STATEMENT: Going to [place] tomorrow.  I have an inflamed area that gets set off by my bra (bottom of the shoulder blade and shoots up to the shoulder blade).  Feels tight right pectoral region.   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  She completed adjuvant chemotherapy with TAC, intolerant to Femara Arimidex and Aromasin, declined  tamoxifen.  She is BRCA1 mutation carrier and has had contralateral mastectomy and bilateral salpingo-oophorectomy.  She has been cancer free for the past 14 years.  She has a lumbar laminectomy/fusion scheduled for 08/29/2021. Right breast mastectomy 2009, and left 2010. 0/3 LN's removed on right.   PAIN:  Are you having pain? No just tight NPRS scale: 0/10 just discomfort today Pain location: Right axillary border of pectorals Pain orientation: Right  PAIN TYPE: aching and tight Pain description: intermittent ,  Aggravating factors: caffeine Relieving factors: not drinking  caffeine  PRECAUTIONS: Left TSA 2016, pending lumbar fusion in January, Right breast Cancer (triple Neg), arthrodesis Right Tarsometatarsal 2021,  WEIGHT BEARING RESTRICTIONS: No  FALLS:  Has patient fallen in last 6 months? No  LIVING ENVIRONMENT: Lives with: with significant other Lives in: House/apartment Stairs: Yes; Internal: 15 steps; on right going up,  3 steps to enter home. Has following equipment at home: Single point cane, Walker - 2 wheeled, shower chair, bed side commode, and knee walker  OCCUPATION: retired  Psychologist, clinical: art, Haematologist, gardening, crafts, building things  HAND DOMINANCE: right   PRIOR LEVEL OF FUNCTION: Independent  PATIENT GOALS: what she can do to alleviate discomfort in right axillary region   OBJECTIVE:  COGNITION: Overall cognitive status: Within functional limits for tasks assessed   PALPATION: Mild tenderness axillary border of pectorals, several small cords noted in axilla running to mid upper arm. Mild tenderness right pec minor and right axillary border of pectorals  OBSERVATIONS / OTHER ASSESSMENTS: well healed mastectomy incisions bilaterally,    SENSATION: Light touch: decreased at right lateral trunk   POSTURE: forward head rounded shoulders  UPPER EXTREMITY AROM/PROM:  A/PROM RIGHT   eval  Right 08/07/2022  Shoulder extension 56 55  Shoulder flexion 157 Pulls into upper arm 157  Shoulder abduction 150 pulls 168  Shoulder internal rotation 68   Shoulder external rotation 107     (Blank rows = not tested)  A/PROM LEFT   eval  Shoulder extension 41  Shoulder flexion 152  Shoulder abduction  NT Left TSA  Shoulder internal rotation   Shoulder external rotation     (Blank rows = not tested)  CERVICAL AROM: All within functional limits:      UPPER EXTREMITY STRENGTH: WFL  LYMPHEDEMA ASSESSMENTS:   SURGERY TYPE/DATE: Bilateral Mastectomy for right breast Cancer without reconstruction  NUMBER OF LYMPH NODES  REMOVED: 0/3  CHEMOTHERAPY: Yes TAC x 6, competed June 2009  RADIATION:No  HORMONE TREATMENT: No  INFECTIONS: NO  LYMPHEDEMA ASSESSMENTS:   LANDMARK RIGHT  eval  10 cm proximal to olecranon process 29.5  Olecranon process 25.3  10 cm proximal to ulnar styloid process 21.1  Just proximal to ulnar styloid process 14.6  Across hand at thumb web space 18.3  At base of 2nd digit 6.25  (Blank rows = not tested)  LANDMARK LEFT  eval  10 cm proximal to olecranon process 29.3  Olecranon process 25  10 cm proximal to ulnar styloid process 20.0  Just proximal to ulnar styloid process 14.8  Across hand at thumb web space 18.1  At base of 2nd digit 6.3  (Blank rows = not tested)  QUICK DASH SURVEY: 9.09%   TODAY'S TREATMENT:   08/09/2022: Manual therapy: soft tissue mobilization to right pectorals, teres, rhomboids Trigger Point Dry-Needling  Treatment instructions: Expect mild to moderate muscle soreness. S/S of pneumothorax if dry needled over a lung field, and to seek immediate medical attention should they occur. Patient verbalized understanding of these instructions and education.  Patient Consent Given: Yes Education handout provided: Yes Muscles treated: right pectorals, teres, rhomboids, subscapularis Electrical stimulation performed: No Parameters: N/A Treatment response/outcome: much improved soft tissue mobility with full abduction ROM noted with ease Discussed performance of home stretching every 2-3 hours for 2-3 minutes either pulleys or on wall to maintain good improvements in ROM     08/07/2022  Pulleys for shoulder flexion and abduction x 1:30 Measured Shoulder ROM Soft tissue mobilization to right UT, pectorals, serratus in supine and in SL to UT, scapular area, lats and serratus all with cocoa butter. MFR to right chest region in various directions Cording release to axillary region and right UE PROM right shoulder flexion , scaption abduction with  release techniques for cording Ball rolls on wall 4 D x 15 Standing scapular retraction and shoulder extension red band x 10   08/03/2022 Soft tissue mobilization to right UT, pectorals, serratus in supine and in SL to UT, scapular area, lats and serratus all with cocoa butter. MFR to right chest region in various directions Cording release to axillary region and right UE PROM right shoulder flexion , scaption abduction with release techniques for cording Supine AROM flexion, scaption, horizontal abd x 5 Supine single arm horizontal abd and ER x 8 with yellow.   07/31/2022 Pulleys shoulder flexion x 2 min, scaption 1 min, abd x 2 min to assist ROM Soft tissue mobilization to right UT, pectorals, serratus in supine and in SL to UT, scapular area, lats and serratus all with cocoa butter. MFR to right chest region in various directions MFR to right axillary region and elbow region of cording during PROM of right shoulder flexion, scaption, abduction Right UE supine ER stabilizing with left and Right UE horizontal abd both with yellow x 10 Updated HEP      07/25/2022 Soft tissue mobilization to right UT, pectorals, serratus in supine and in SL to UT, scapular area, lats and serratus all with cocoa butter. MFR to right chest region in various directions MFR to right axillary region of cording Supine wand scaption to stretch pectorals and medial arm x 5. Cautioned pt not to overdo and never to have pain in either shoulder with exercises.  DATE: 07/24/2022 Educated pt in beginning HEP including Right UE stargazer, and right UE wall slides for abduction to gently stretch tight areas  PATIENT EDUCATION:  Education details: right UE Writer, wallslides Person educated: Patient Education method: Explanation, Demonstration, and Handouts Education comprehension:  verbalized understanding and returned demonstration  HOME EXERCISE PROGRAM: Right UE stargazer and standing wall slides  ASSESSMENT:  CLINICAL IMPRESSION: The patient had numerous muscle twitches produced during dry needling which is a good prognostic indicator for benefit.   She responded well to initial performance of DN with improved shoulder abduction ROM noted immediately.  The patient was encouraged in regular performance of HEP post DN  to enhance long term benefit.    OBJECTIVE IMPAIRMENTS: decreased ROM, increased fascial restrictions, impaired flexibility, impaired sensation, and pain.   ACTIVITY LIMITATIONS: reach over head  PARTICIPATION LIMITATIONS:  pt does all that is required of her with tightness/ tenderness and mild pain  PERSONAL FACTORS: 3+ comorbidities:  BRCA1 Positive,s/p bilateral mastectomies, chronic LBP and LE pain pending surgery  , left TSA,Right foot T-M arthrodesis are also affecting patient's functional outcome.   REHAB POTENTIAL: Excellent CLINICAL DECISION MAKING: Stable/uncomplicated  EVALUATION COMPLEXITY: Low  GOALS: Goals reviewed with patient? Yes  SHORT TERM GOALS =LONG TERM GOALS: Target date: 08/21/2022  Pt will be independent and compliant with HEP for Right chest /upper body stretching/strengthening Baseline:  Goal status:MET 10/18/2021  2.  Pt will have decrease sensitivity/achiness by greater than 50% Baseline:  Goal status: INITIAL  3.  Pt will restore Right shoulder ROM WNL without complaints of pulling/tighness Baseline:  Goal status: INITIAL    PLAN:  PT FREQUENCY: 2x/week  PT DURATION: 4 weeks  PLANNED INTERVENTIONS: Therapeutic exercises, Therapeutic activity, Neuromuscular re-education, Patient/Family education, Self Care, scar mobilization, Manual therapy, and Re-evaluation, Dry needling  PLAN FOR NEXT SESSION:assess response to DN #1 to pecs/scapular, STM right pectorals, Lats, UT, MFR to cording/chest region,  consider cupping prn, review UE band ER, HA with Right UE, strengthening,update HEP(caution with left TSA);  upcoming lumbar fusion surgery 08/29/22  Ruben Im, PT 08/09/22 12:39 PM Phone: [phone] Fax: [phone]

## 2022-08-09 NOTE — Telephone Encounter (Signed)
Copied from [place] [phone]. Topic: Medicare AWV >> Aug 09, 2022 11:14 AM Gillis Santa wrote: Reason for CRM: LVM PATIENT TO CALL [phone] SCHEDULE AWVS [place]

## 2022-08-17 ENCOUNTER — Ambulatory Visit: Payer: Medicare Other

## 2022-08-17 DIAGNOSIS — Z853 Personal history of malignant neoplasm of breast: Secondary | ICD-10-CM | POA: Diagnosis not present

## 2022-08-17 DIAGNOSIS — M25611 Stiffness of right shoulder, not elsewhere classified: Secondary | ICD-10-CM

## 2022-08-17 DIAGNOSIS — Z483 Aftercare following surgery for neoplasm: Secondary | ICD-10-CM

## 2022-08-17 NOTE — Therapy (Signed)
OUTPATIENT PHYSICAL THERAPY ONCOLOGY TREATMENT  Patient Name: Shannon Osborne MRN: [id number] DOB:1956/07/11, 66 y.o., female Today's Date: 08/17/2022  END OF SESSION:  PT End of Session - 08/17/22 1058     Visit Number 7    Number of Visits 17    Date for PT Re-Evaluation 10/12/22    PT Start Time 1100    PT Stop Time 1152    PT Time Calculation (min) 52 min    Activity Tolerance Patient tolerated treatment well    Behavior During Therapy [facility] for tasks assessed/performed             Past Medical History:  Diagnosis Date   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   Breast cancer ([place]) 2009   BRACA 1 Pos    Chronic midline low back pain without sciatica 02/18/2020   GERD (gastroesophageal reflux disease) 02/18/2020   Hyperlipemia    Multiple drug allergies 02/18/2020   Myalgia due to statin 02/18/2020   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   MRI 05/2018   Osteopenia after menopause 02/11/2021   DEXA 01/2021 lowest T = -1.7 femur; recheck 2-3 years   Past Surgical History:  Procedure Laterality Date   ABDOMINAL HYSTERECTOMY  2002   BREAST SURGERY     right 09/10/2007, left 06/16/2009   FOOT BONE EXCISION     SHOULDER ARTHROTOMY Left 2013   and 06/2015   TONSILLECTOMY  2012   Patient Active Problem List   Diagnosis Date Noted   History of asbestos exposure 03/02/2022   Mild mitral regurgitation 03/02/2022   Atherosclerosis of aorta ([place]) 03/02/2022   Agatston coronary artery calcium score between 200 and 399 03/02/2022   Essential hypertension 07/07/2021   Osteopenia after menopause 02/11/2021   Long-term current use of proton pump inhibitor therapy 11/29/2020   Postherpetic neuralgia 04/28/2020   History of breast cancer 02/18/2020   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   GERD (gastroesophageal reflux disease) 02/18/2020   Myalgia due to statin 02/18/2020   Mixed hyperlipidemia 02/18/2020   Colon polyp 02/18/2020    Multiple drug allergies 02/18/2020   Status post bilateral mastectomy 02/18/2020   Chronic midline low back pain without sciatica 02/18/2020   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   Post-traumatic arthritis of right foot 01/16/2020   Status post shoulder replacement, 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    PCP: Billey Chang, MD  REFERRING PROVIDER: Benay Pike, MD  REFERRING DIAG: s/p Bilateral Mastectomies with Right breast Cancer  THERAPY DIAG:  Stiffness of right shoulder, not elsewhere classified  Personal history of malignant neoplasm of breast  Aftercare following surgery for neoplasm  ONSET DATE: Sept 2023  Rationale for Evaluation and Treatment: Rehabilitation  SUBJECTIVE:  SUBJECTIVE STATEMENT: The DN really helped. The scapular area really felt good and cleared up. It seemed to amplify the right side of my neck though. I feel tight in my right neck today and my right shoulder ROM is much better.  I get a little achy in my elbow when I reach to extremes and it pulls tight into my pectorals.   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  She completed adjuvant chemotherapy with TAC, intolerant to Femara Arimidex and Aromasin, declined  tamoxifen.  She is BRCA1 mutation carrier and has had contralateral mastectomy and bilateral salpingo-oophorectomy.  She has been cancer free for the past 14 years.  She has a lumbar laminectomy/fusion scheduled for 08/29/2021. Right breast mastectomy 2009, and left 2010. 0/3 LN's removed on right.   PAIN:  Are you having pain? No just tight NPRS scale: 0/10 just  discomfort today Pain location: Right axillary border of pectorals Pain orientation: Right  PAIN TYPE: aching and tight Pain description: intermittent ,  Aggravating factors: caffeine Relieving factors: not drinking caffeine  PRECAUTIONS: Left TSA 2016, pending lumbar fusion in January, Right breast Cancer (triple Neg), arthrodesis Right Tarsometatarsal 2021,  WEIGHT BEARING RESTRICTIONS: No  FALLS:  Has patient fallen in last 6 months? No  LIVING ENVIRONMENT: Lives with: with significant other Lives in: House/apartment Stairs: Yes; Internal: 15 steps; on right going up,  3 steps to enter home. Has following equipment at home: Single point cane, Walker - 2 wheeled, shower chair, bed side commode, and knee walker  OCCUPATION: retired  Psychologist, clinical: art, Haematologist, gardening, crafts, building things  HAND DOMINANCE: right   PRIOR LEVEL OF FUNCTION: Independent  PATIENT GOALS: what she can do to alleviate discomfort in right axillary region   OBJECTIVE:  COGNITION: Overall cognitive status: Within functional limits for tasks assessed   PALPATION: Mild tenderness axillary border of pectorals, several small cords noted in axilla running to mid upper arm. Mild tenderness right pec minor and right axillary border of pectorals  OBSERVATIONS / OTHER ASSESSMENTS: well healed mastectomy incisions bilaterally,    SENSATION: Light touch: decreased at right lateral trunk   POSTURE: forward head rounded shoulders  UPPER EXTREMITY AROM/PROM:  A/PROM RIGHT   eval  Right 08/07/2022 Right 08/17/2022  Shoulder extension 56 55 60  Shoulder flexion 157 Pulls into upper arm 157 164  Shoulder abduction 150 pulls 168 172  Shoulder internal rotation 68    Shoulder external rotation 107      (Blank rows = not tested)  A/PROM LEFT   eval  Shoulder extension 41  Shoulder flexion 152  Shoulder abduction  NT Left TSA  Shoulder internal rotation   Shoulder external rotation     (Blank  rows = not tested)  CERVICAL AROM: All within functional limits:      UPPER EXTREMITY STRENGTH: WFL  LYMPHEDEMA ASSESSMENTS:   SURGERY TYPE/DATE: Bilateral Mastectomy for right breast Cancer without reconstruction  NUMBER OF LYMPH NODES REMOVED: 0/3  CHEMOTHERAPY: Yes TAC x 6, competed June 2009  RADIATION:No  HORMONE TREATMENT: No  INFECTIONS: NO  LYMPHEDEMA ASSESSMENTS:   LANDMARK RIGHT  eval  10 cm proximal to olecranon process 29.5  Olecranon process 25.3  10 cm proximal to ulnar styloid process 21.1  Just  proximal to ulnar styloid process 14.6  Across hand at thumb web space 18.3  At base of 2nd digit 6.25  (Blank rows = not tested)  LANDMARK LEFT  eval  10 cm proximal to olecranon process 29.3  Olecranon process 25  10 cm proximal to ulnar styloid process 20.0  Just proximal to ulnar styloid process 14.8  Across hand at thumb web space 18.1  At base of 2nd digit 6.3  (Blank rows = not tested)  QUICK DASH SURVEY: 9.09%   TODAY'S TREATMENT:    08/17/2022 Soft tissue mobilization to right UT, pectorals, serratus in supine and in SL to UT, scapular area, lats and serratus all with cocoa butter. MFR to right chest region in various directions Cording release to axillary region and right UE PROM right shoulder flexion , scaption abduction with release techniques for cording Supine bilateral shoulder flexion yellow x 5, Right horizontal abd and ER yellow x 6 Standing scapular retraction and shoulder extension red band x 10  08/09/2022: Manual therapy: soft tissue mobilization to right pectorals, teres, rhomboids Trigger Point Dry-Needling  Treatment instructions: Expect mild to moderate muscle soreness. S/S of pneumothorax if dry needled over a lung field, and to seek immediate medical attention should they occur. Patient verbalized understanding of these instructions and education.  Patient Consent Given: Yes Education handout provided: Yes Muscles  treated: right pectorals, teres, rhomboids, subscapularis Electrical stimulation performed: No Parameters: N/A Treatment response/outcome: much improved soft tissue mobility with full abduction ROM noted with ease Discussed performance of home stretching every 2-3 hours for 2-3 minutes either pulleys or on wall to maintain good improvements in ROM  08/07/2022  Pulleys for shoulder flexion and abduction x 1:30 Measured Shoulder ROM Soft tissue mobilization to right UT, pectorals, serratus in supine and in SL to UT, scapular area, lats and serratus all with cocoa butter. MFR to right chest region in various directions Cording release to axillary region and right UE PROM right shoulder flexion , scaption abduction with release techniques for cording Ball rolls on wall 4 D x 15 Standing scapular retraction and shoulder extension red band x 10   08/03/2022 Soft tissue mobilization to right UT, pectorals, serratus in supine and in SL to UT, scapular area, lats and serratus all with cocoa butter. MFR to right chest region in various directions Cording release to axillary region and right UE PROM right shoulder flexion , scaption abduction with release techniques for cording Supine AROM flexion, scaption, horizontal abd x 5 Supine single arm horizontal abd and ER x 8 with yellow.                                                                                                                                      DATE: 07/24/2022 Educated pt in beginning HEP including Right UE stargazer, and right UE wall slides for abduction to gently stretch tight areas  PATIENT EDUCATION:  Education details: right UE star gazer, wallslides Person educated: Patient Education method: Explanation, Demonstration, and Handouts Education comprehension: verbalized understanding and returned demonstration  HOME EXERCISE PROGRAM: Right UE stargazer and standing wall slides, SR, shoulder ext and ER red x  10  ASSESSMENT:  CLINICAL IMPRESSION: Pt demonstrates good improvement in Right shoulder ROM since her DN session last week. Overall pain is atleast 40% better  OBJECTIVE IMPAIRMENTS: decreased ROM, increased fascial restrictions, impaired flexibility, impaired sensation, and pain.   ACTIVITY LIMITATIONS: reach over head  PARTICIPATION LIMITATIONS:  pt does all that is required of her with tightness/ tenderness and mild pain  PERSONAL FACTORS: 3+ comorbidities:  BRCA1 Positive,s/p bilateral mastectomies, chronic LBP and LE pain pending surgery  , left TSA,Right foot T-M arthrodesis are also affecting patient's functional outcome.   REHAB POTENTIAL: Excellent CLINICAL DECISION MAKING: Stable/uncomplicated  EVALUATION COMPLEXITY: Low  GOALS: Goals reviewed with patient? Yes  SHORT TERM GOALS =LONG TERM GOALS: Target date: 08/21/2022  Pt will be independent and compliant with HEP for Right chest /upper body stretching/strengthening Baseline:  Goal status:MET 10/18/2021  2.  Pt will have decrease sensitivity/achiness by greater than 50% Baseline:  Goal status: IN PROGRESS (40% better overall.)  3.  Pt will restore Right shoulder ROM WNL without complaints of pulling/tighness Baseline:  Goal status: IN PROGRESS; improved today  PLAN:  PT FREQUENCY: 1-2x/week  PT DURATION:  8 week period( pt will be having back surgery on Jan. 9 and will not be able to come for therapy for 3 weeks or more. Anticipate no more than 5 weeks of sessions  PLANNED INTERVENTIONS: Therapeutic exercises, Therapeutic activity, Neuromuscular re-education, Patient/Family education, Self Care, scar mobilization, Manual therapy, and Re-evaluation, Dry needling  PLAN FOR NEXT SESSION:assess response to DN #1 to pecs/scapular, STM right pectorals, Lats, UT, MFR to cording/chest region, consider cupping prn, review UE band ER, HA with Right UE, strengthening,update HEP(caution with left TSA);  upcoming lumbar  fusion surgery 08/29/22  Cheral Almas, PT 08/17/22 11:57 AM Phone: [phone] Fax: [phone]

## 2022-08-18 ENCOUNTER — Ambulatory Visit: Payer: Medicare Other | Admitting: Physical Therapy

## 2022-08-18 DIAGNOSIS — Z853 Personal history of malignant neoplasm of breast: Secondary | ICD-10-CM

## 2022-08-18 DIAGNOSIS — M25611 Stiffness of right shoulder, not elsewhere classified: Secondary | ICD-10-CM

## 2022-08-18 NOTE — Pre-Procedure Instructions (Signed)
Surgical Instructions    Your procedure is scheduled on Tuesday, January 9th.  Report to [facility] Main Entrance "A" at 05:30 A.M., then check in with the Admitting office.  Call this number if you have problems the morning of surgery:  [phone]  If you have any questions prior to your surgery date call [phone]: Open Monday-Friday 8am-4pm If you experience any cold or flu symptoms such as cough, fever, chills, shortness of breath, etc. between now and your scheduled surgery, please notify us at the above number.     Remember:  Do not eat after midnight the night before your surgery  You may drink clear liquids until 04:30 AM the morning of your surgery.   Clear liquids allowed are: Water, Non-Citrus Juices (without pulp), Carbonated Beverages, Clear Tea, Black Coffee Only (NO MILK, CREAM OR POWDERED CREAMER of any kind), and Gatorade.    Take these medicines the morning of surgery with A SIP OF WATER  icosapent Ethyl (VASCEPA)  metoprolol succinate (TOPROL-XL)  pregabalin (LYRICA)    If needed: azelastine (ASTELIN) nasal spray carisoprodol (SOMA)  fluticasone (FLONASE SENSIMIST)  omeprazole (PRILOSEC)    Follow your surgeon's instructions on when to stop Aspirin.  If no instructions were given by your surgeon then you will need to call the office to get those instructions.     As of today, STOP taking any Aleve, Naproxen, Ibuprofen, Motrin, Advil, Goody's, BC's, all herbal medications, fish oil, and all vitamins.                     Do NOT Smoke (Tobacco/Vaping) for 24 hours prior to your procedure.  If you use a CPAP at night, you may bring your mask/headgear for your overnight stay.   Contacts, glasses, piercing's, hearing aid's, dentures or partials may not be worn into surgery, please bring cases for these belongings.    For patients admitted to the hospital, discharge time will be determined by your treatment team.   Patients discharged the day of surgery  will not be allowed to drive home, and someone needs to stay with them for 24 hours.  SURGICAL WAITING ROOM VISITATION Patients having surgery or a procedure may have no more than 2 support people in the waiting area - these visitors may rotate.   Children under the age of 58 must have an adult with them who is not the patient. If the patient needs to stay at the hospital during part of their recovery, the visitor guidelines for inpatient rooms apply. Pre-op nurse will coordinate an appropriate time for 1 support person to accompany patient in pre-op.  This support person may not rotate.   Please refer to the [facility] website for the visitor guidelines for Inpatients (after your surgery is over and you are in a regular room).    Special instructions:   [place]- Preparing For Surgery  Before surgery, you can play an important role. Because skin is not sterile, your skin needs to be as free of germs as possible. You can reduce the number of germs on your skin by washing with CHG (chlorahexidine gluconate) Soap before surgery.  CHG is an antiseptic cleaner which kills germs and bonds with the skin to continue killing germs even after washing.    Oral Hygiene is also important to reduce your risk of infection.  Remember - BRUSH YOUR TEETH THE MORNING OF SURGERY WITH YOUR REGULAR TOOTHPASTE  Please do not use if you have an allergy to CHG  or antibacterial soaps. If your skin becomes reddened/irritated stop using the CHG.  Do not shave (including legs and underarms) for at least 48 hours prior to first CHG shower. It is OK to shave your face.  Please follow these instructions carefully.   Shower the NIGHT BEFORE SURGERY and the MORNING OF SURGERY  If you chose to wash your hair, wash your hair first as usual with your normal shampoo.  After you shampoo, rinse your hair and body thoroughly to remove the shampoo.  Use CHG Soap as you would any other liquid soap. You can apply CHG directly  to the skin and wash gently with a scrungie or a clean washcloth.   Apply the CHG Soap to your body ONLY FROM THE NECK DOWN.  Do not use on open wounds or open sores. Avoid contact with your eyes, ears, mouth and genitals (private parts). Wash Face and genitals (private parts)  with your normal soap.   Wash thoroughly, paying special attention to the area where your surgery will be performed.  Thoroughly rinse your body with warm water from the neck down.  DO NOT shower/wash with your normal soap after using and rinsing off the CHG Soap.  Pat yourself dry with a CLEAN TOWEL.  Wear CLEAN PAJAMAS to bed the night before surgery  Place CLEAN SHEETS on your bed the night before your surgery  DO NOT SLEEP WITH PETS.   Day of Surgery: Take a shower with CHG soap. Do not wear jewelry or makeup Do not wear lotions, powders, perfumes, or deodorant. Do not shave 48 hours prior to surgery.   Do not bring valuables to the hospital. [facility] is not responsible for any belongings or valuables. Do not wear nail polish, gel polish, artificial nails, or any other type of covering on natural nails (fingers and toes) If you have artificial nails or gel coating that need to be removed by a nail salon, please have this removed prior to surgery. Artificial nails or gel coating may interfere with anesthesia's ability to adequately monitor your vital signs. Wear Clean/Comfortable clothing the morning of surgery Remember to brush your teeth WITH YOUR REGULAR TOOTHPASTE.   Please read over the following fact sheets that you were given.    If you received a COVID test during your pre-op visit  it is requested that you wear a mask when out in public, stay away from anyone that may not be feeling well and notify your surgeon if you develop symptoms. If you have been in contact with anyone that has tested positive in the last 10 days please notify you surgeon.

## 2022-08-18 NOTE — Therapy (Signed)
OUTPATIENT PHYSICAL THERAPY ONCOLOGY TREATMENT  Patient Name: Shannon Osborne MRN: [id number] DOB:1956-03-21, 66 y.o., female Today's Date: 08/18/2022  END OF SESSION:  PT End of Session - 08/18/22 0803     Visit Number 8    Number of Visits 17    Date for PT Re-Evaluation 10/12/22    Authorization Type medicare    PT Start Time 0801    PT Stop Time 0840    PT Time Calculation (min) 39 min    Activity Tolerance Patient tolerated treatment well             Past Medical History:  Diagnosis Date   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   Breast cancer ([place]) 2009   BRACA 1 Pos    Chronic midline low back pain without sciatica 02/18/2020   GERD (gastroesophageal reflux disease) 02/18/2020   Hyperlipemia    Multiple drug allergies 02/18/2020   Myalgia due to statin 02/18/2020   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   MRI 05/2018   Osteopenia after menopause 02/11/2021   DEXA 01/2021 lowest T = -1.7 femur; recheck 2-3 years   Past Surgical History:  Procedure Laterality Date   ABDOMINAL HYSTERECTOMY  2002   BREAST SURGERY     right 09/10/2007, left 06/16/2009   FOOT BONE EXCISION     SHOULDER ARTHROTOMY Left 2013   and 06/2015   TONSILLECTOMY  2012   Patient Active Problem List   Diagnosis Date Noted   History of asbestos exposure 03/02/2022   Mild mitral regurgitation 03/02/2022   Atherosclerosis of aorta ([place]) 03/02/2022   Agatston coronary artery calcium score between 200 and 399 03/02/2022   Essential hypertension 07/07/2021   Osteopenia after menopause 02/11/2021   Long-term current use of proton pump inhibitor therapy 11/29/2020   Postherpetic neuralgia 04/28/2020   History of breast cancer 02/18/2020   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   GERD (gastroesophageal reflux disease) 02/18/2020   Myalgia due to statin 02/18/2020   Mixed hyperlipidemia 02/18/2020   Colon polyp 02/18/2020   Multiple drug allergies  02/18/2020   Status post bilateral mastectomy 02/18/2020   Chronic midline low back pain without sciatica 02/18/2020   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   Post-traumatic arthritis of right foot 01/16/2020   Status post shoulder replacement, 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    PCP: Billey Chang, MD  REFERRING PROVIDER: Benay Pike, MD  REFERRING DIAG: s/p Bilateral Mastectomies with Right breast Cancer  THERAPY DIAG:  Stiffness of right shoulder, not elsewhere classified  Personal history of malignant neoplasm of breast  ONSET DATE: Sept 2023  Rationale for Evaluation and Treatment: Rehabilitation  SUBJECTIVE:  SUBJECTIVE STATEMENT: Scapular discomfort and pectoral discomfort relieved with DN.    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  She completed adjuvant chemotherapy with TAC, intolerant to Femara Arimidex and Aromasin, declined  tamoxifen.  She is BRCA1 mutation carrier and has had contralateral mastectomy and bilateral salpingo-oophorectomy.  She has been cancer free for the past 14 years.  She has a lumbar laminectomy/fusion scheduled for 08/29/2021. Right breast mastectomy 2009, and left 2010. 0/3 LN's removed on right.   PAIN:  Are you having pain?yes NPRS scale: 1-2/10 just discomfort today Pain location: Right axillary border of pectorals Pain orientation: Right  PAIN TYPE: aching and tight Pain description: intermittent ,  Aggravating factors: caffeine Relieving factors: not drinking caffeine  PRECAUTIONS: Left TSA 2016, pending lumbar fusion in January, Right breast Cancer  (triple Neg), arthrodesis Right Tarsometatarsal 2021,  WEIGHT BEARING RESTRICTIONS: No  FALLS:  Has patient fallen in last 6 months? No  LIVING ENVIRONMENT: Lives with: with significant other Lives in: House/apartment Stairs: Yes; Internal: 15 steps; on right going up,  3 steps to enter home. Has following equipment at home: Single point cane, Walker - 2 wheeled, shower chair, bed side commode, and knee walker  OCCUPATION: retired  Psychologist, clinical: art, Haematologist, gardening, crafts, building things  HAND DOMINANCE: right   PRIOR LEVEL OF FUNCTION: Independent  PATIENT GOALS: what she can do to alleviate discomfort in right axillary region   OBJECTIVE:  COGNITION: Overall cognitive status: Within functional limits for tasks assessed   PALPATION: Mild tenderness axillary border of pectorals, several small cords noted in axilla running to mid upper arm. Mild tenderness right pec minor and right axillary border of pectorals  OBSERVATIONS / OTHER ASSESSMENTS: well healed mastectomy incisions bilaterally,    SENSATION: Light touch: decreased at right lateral trunk   POSTURE: forward head rounded shoulders  UPPER EXTREMITY AROM/PROM:  A/PROM RIGHT   eval  Right 08/07/2022 Right 08/17/2022  Shoulder extension 56 55 60  Shoulder flexion 157 Pulls into upper arm 157 164  Shoulder abduction 150 pulls 168 172  Shoulder internal rotation 68    Shoulder external rotation 107      (Blank rows = not tested)  A/PROM LEFT   eval  Shoulder extension 41  Shoulder flexion 152  Shoulder abduction  NT Left TSA  Shoulder internal rotation   Shoulder external rotation     (Blank rows = not tested)  CERVICAL AROM: All within functional limits:      UPPER EXTREMITY STRENGTH: WFL  LYMPHEDEMA ASSESSMENTS:   SURGERY TYPE/DATE: Bilateral Mastectomy for right breast Cancer without reconstruction  NUMBER OF LYMPH NODES REMOVED: 0/3  CHEMOTHERAPY: Yes TAC x 6, competed June  2009  RADIATION:No  HORMONE TREATMENT: No  INFECTIONS: NO  LYMPHEDEMA ASSESSMENTS:   LANDMARK RIGHT  eval  10 cm proximal to olecranon process 29.5  Olecranon process 25.3  10 cm proximal to ulnar styloid process 21.1  Just proximal to ulnar styloid process 14.6  Across hand at thumb web space 18.3  At base of 2nd digit 6.25  (Blank rows = not tested)  LANDMARK LEFT  eval  10 cm proximal to olecranon process 29.3  Olecranon process 25  10 cm proximal to ulnar styloid process 20.0  Just  proximal to ulnar styloid process 14.8  Across hand at thumb web space 18.1  At base of 2nd digit 6.3  (Blank rows = not tested)  QUICK DASH SURVEY: 9.09%   TODAY'S TREATMENT:   12/29 Manual therapy: soft tissue mobilization to cervical paraspinals, right and left upper trap , levator scap Trigger Point Dry-Needling  Treatment instructions: Expect mild to moderate muscle soreness. S/S of pneumothorax if dry needled over a lung field, and to seek immediate medical attention should they occur. Patient verbalized understanding of these instructions and education.  Patient Consent Given: Yes Education handout provided: Yes Muscles treated: cervical right multifidi, right and left upper trap; right upper trap anterior and posterior approach, right levator scap Electrical stimulation performed: No Parameters: N/A Treatment response/outcome: much improved soft tissue mobility with full abduction ROM noted with ease Seated upper trap stretch 20-30 sec hold Seated levator scap stretch 20-30 sec Seated scap retractions 10x Discussed avoidance of shoulder compensatory elevation (upper trap overuse)     08/17/2022 Soft tissue mobilization to right UT, pectorals, serratus in supine and in SL to UT, scapular area, lats and serratus all with cocoa butter. MFR to right chest region in various directions Cording release to axillary region and right UE PROM right shoulder flexion , scaption  abduction with release techniques for cording Supine bilateral shoulder flexion yellow x 5, Right horizontal abd and ER yellow x 6 Standing scapular retraction and shoulder extension red band x 10  08/09/2022: Manual therapy: soft tissue mobilization to right pectorals, teres, rhomboids Trigger Point Dry-Needling  Treatment instructions: Expect mild to moderate muscle soreness. S/S of pneumothorax if dry needled over a lung field, and to seek immediate medical attention should they occur. Patient verbalized understanding of these instructions and education.  Patient Consent Given: Yes Education handout provided: Yes Muscles treated: right pectorals, teres, rhomboids, subscapularis Electrical stimulation performed: No Parameters: N/A Treatment response/outcome: much improved soft tissue mobility with full abduction ROM noted with ease Discussed performance of home stretching every 2-3 hours for 2-3 minutes either pulleys or on wall to maintain good improvements in ROM  08/07/2022  Pulleys for shoulder flexion and abduction x 1:30 Measured Shoulder ROM Soft tissue mobilization to right UT, pectorals, serratus in supine and in SL to UT, scapular area, lats and serratus all with cocoa butter. MFR to right chest region in various directions Cording release to axillary region and right UE PROM right shoulder flexion , scaption abduction with release techniques for cording Ball rolls on wall 4 D x 15 Standing scapular retraction and shoulder extension red band x 10   08/03/2022 Soft tissue mobilization to right UT, pectorals, serratus in supine and in SL to UT, scapular area, lats and serratus all with cocoa butter. MFR to right chest region in various directions Cording release to axillary region and right UE PROM right shoulder flexion , scaption abduction with release techniques for cording Supine AROM flexion, scaption, horizontal abd x 5 Supine single arm horizontal abd and ER x 8  with yellow.   PATIENT EDUCATION:  Education details: right UE Writer, wallslides Person educated: Patient Education method: Explanation, Demonstration, and Handouts Education comprehension: verbalized understanding and returned demonstration  HOME EXERCISE PROGRAM: Right UE stargazer and standing wall slides, SR, shoulder ext and ER red x 10 Access Code: [email or web address] URL: [email or web address] Date: 08/18/2022 Prepared by: Ruben Im  Exercises - Seated Upper Trapezius Stretch  - 3 x daily - 7 x weekly - 1 sets - 3 reps -  20-30 hold - Gentle Levator Scapulae Stretch (Mirrored)  - 3 x daily - 7 x weekly - 1 sets - 3 reps - 20-20 hold - Seated Scapular Retraction  - 1 x daily - 7 x weekly - 1 sets - 10 reps ASSESSMENT:  CLINICAL IMPRESSION: The patient benefits significantly from dry needling and manual therapy to stimulate underlying myofascial trigger points and muscular tissue for management of neuromusculoskeletal pain and address movement impairments.  Much improved soft tissue mobility and decreased tender point size and number following treatment session.   The patient was encouraged in regular performance of HEP post DN including soft tissue lengthening and strengthening exercises to enhance long term benefit.    OBJECTIVE IMPAIRMENTS: decreased ROM, increased fascial restrictions, impaired flexibility, impaired sensation, and pain.   ACTIVITY LIMITATIONS: reach over head  PARTICIPATION LIMITATIONS:  pt does all that is required of her with tightness/ tenderness and mild pain  PERSONAL FACTORS: 3+ comorbidities:  BRCA1 Positive,s/p bilateral mastectomies, chronic LBP and LE pain pending surgery  , left TSA,Right foot T-M arthrodesis are also affecting patient's functional outcome.   REHAB POTENTIAL: Excellent CLINICAL DECISION MAKING: Stable/uncomplicated  EVALUATION COMPLEXITY: Low  GOALS: Goals reviewed with patient? Yes  SHORT TERM GOALS =LONG TERM  GOALS: Target date: 08/21/2022  Pt will be independent and compliant with HEP for Right chest /upper body stretching/strengthening Baseline:  Goal status:MET 10/18/2021  2.  Pt will have decrease sensitivity/achiness by greater than 50% Baseline:  Goal status: IN PROGRESS (40% better overall.)  3.  Pt will restore Right shoulder ROM WNL without complaints of pulling/tighness Baseline:  Goal status: IN PROGRESS; improved today  PLAN:  PT FREQUENCY: 1-2x/week  PT DURATION:  8 week period( pt will be having back surgery on Jan. 9 and will not be able to come for therapy for 3 weeks or more. Anticipate no more than 5 weeks of sessions  PLANNED INTERVENTIONS: Therapeutic exercises, Therapeutic activity, Neuromuscular re-education, Patient/Family education, Self Care, scar mobilization, Manual therapy, and Re-evaluation, Dry needling  PLAN FOR NEXT SESSION:assess response to DN #2 ; Lats, UT, MFR to cording/chest region, consider cupping prn, review UE band ER, HA with Right UE, strengthening,update HEP(caution with left TSA);  upcoming lumbar fusion surgery 08/29/22  Ruben Im, PT 08/18/22 8:44 AM Phone: [phone] Fax: [phone]

## 2022-08-22 ENCOUNTER — Other Ambulatory Visit: Payer: Self-pay

## 2022-08-22 ENCOUNTER — Encounter (HOSPITAL_COMMUNITY)
Admission: RE | Admit: 2022-08-22 | Discharge: 2022-08-22 | Disposition: A | Discharge status: Home / Self Care | Payer: Medicare Other | Source: Ambulatory Visit | Attending: Neurological Surgery | Admitting: Neurological Surgery

## 2022-08-22 ENCOUNTER — Encounter (HOSPITAL_COMMUNITY): Payer: Self-pay

## 2022-08-22 ENCOUNTER — Ambulatory Visit: Payer: Medicare Other | Attending: Hematology and Oncology

## 2022-08-22 VITALS — BP 134/82 | HR 98 | Temp 98.2°F | Resp 18 | Ht 59.0 in | Wt 144.0 lb

## 2022-08-22 DIAGNOSIS — Z01818 Encounter for other preprocedural examination: Secondary | ICD-10-CM | POA: Diagnosis present

## 2022-08-22 DIAGNOSIS — M25611 Stiffness of right shoulder, not elsewhere classified: Secondary | ICD-10-CM | POA: Insufficient documentation

## 2022-08-22 DIAGNOSIS — Z853 Personal history of malignant neoplasm of breast: Secondary | ICD-10-CM | POA: Diagnosis present

## 2022-08-22 DIAGNOSIS — Z483 Aftercare following surgery for neoplasm: Secondary | ICD-10-CM

## 2022-08-22 HISTORY — DX: Nonrheumatic mitral (valve) prolapse: I34.1

## 2022-08-22 HISTORY — DX: Family history of other specified conditions: Z84.89

## 2022-08-22 HISTORY — DX: Pneumonia, unspecified organism: J18.9

## 2022-08-22 HISTORY — DX: Anemia, unspecified: D64.9

## 2022-08-22 HISTORY — DX: Nausea with vomiting, unspecified: R11.2

## 2022-08-22 HISTORY — DX: Other specified postprocedural states: Z98.890

## 2022-08-22 HISTORY — DX: Essential (primary) hypertension: I10

## 2022-08-22 LAB — BASIC METABOLIC PANEL
Anion gap: 6 (ref 5–15)
BUN: 13 mg/dL (ref 8–23)
CO2: 27 mmol/L (ref 22–32)
Calcium: 8.9 mg/dL (ref 8.9–10.3)
Chloride: 103 mmol/L (ref 98–111)
Creatinine, Ser: 0.79 mg/dL (ref 0.44–1.00)
GFR, Estimated: >60 mL/min (ref >60)
Glucose, Bld: 102 mg/dL — ABNORMAL HIGH (ref 70–99)
Potassium: 4.1 mmol/L (ref 3.5–5.1)
Sodium: 136 mmol/L (ref 135–145)

## 2022-08-22 LAB — TYPE AND SCREEN
ABO/RH(D): A POS
Antibody Screen: NEGATIVE

## 2022-08-22 LAB — CBC
HCT: 36.3 % (ref 36.0–46.0)
Hemoglobin: 11.8 g/dL — ABNORMAL LOW (ref 12.0–15.0)
MCH: 29.5 pg (ref 26.0–34.0)
MCHC: 32.5 g/dL (ref 30.0–36.0)
MCV: 90.8 fL (ref 80.0–100.0)
Platelets: 268 10*3/uL (ref 150–400)
RBC: 4 MIL/uL (ref 3.87–5.11)
RDW: 12.1 % (ref 11.5–15.5)
WBC: 7.1 10*3/uL (ref 4.0–10.5)
nRBC: 0 % (ref 0.0–0.2)

## 2022-08-22 LAB — SURGICAL PCR SCREEN
MRSA, PCR: NEGATIVE
Staphylococcus aureus: POSITIVE — AB

## 2022-08-22 NOTE — Progress Notes (Signed)
PCP - Dr. Jonni Sanger Cardiologist - Dr. Johney Frame  EKG - 08/22/22 ECHO - 10/27/21  Aspirin Instructions:no instructions given, instructed pt to call surgeon to get instructions  ERAS Protcol - PRE-SURGERY Ensure or G2-   Anesthesia review: No  Patient denies shortness of breath, fever, cough and chest pain at PAT appointment   All instructions explained to the patient, with a verbal understanding of the material. Patient agrees to go over the instructions while at home for a better understanding. Patient also instructed to self quarantine after being tested for COVID-19. The opportunity to ask questions was provided.

## 2022-08-22 NOTE — Therapy (Addendum)
OUTPATIENT PHYSICAL THERAPY ONCOLOGY TREATMENT  Patient Name: Shannon Osborne MRN: [id number] DOB:[birth year], 67 y.o., female Today's Date: 08/22/2022  END OF SESSION:  PT End of Session - 08/22/22 0753     Visit Number 9    Number of Visits 17    Date for PT Re-Evaluation 10/12/22    Authorization Type medicare    PT Start Time 0800    PT Stop Time 7782    PT Time Calculation (min) 50 min    Activity Tolerance Patient tolerated treatment well    Behavior During Therapy [facility] for tasks assessed/performed             Past Medical History:  Diagnosis Date   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   Breast cancer ([place]) 2009   BRACA 1 Pos    Chronic midline low back pain without sciatica 02/18/2020   GERD (gastroesophageal reflux disease) 02/18/2020   Hyperlipemia    Multiple drug allergies 02/18/2020   Myalgia due to statin 02/18/2020   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   MRI 05/2018   Osteopenia after menopause 02/11/2021   DEXA 01/2021 lowest T = -1.7 femur; recheck 2-3 years   Past Surgical History:  Procedure Laterality Date   ABDOMINAL HYSTERECTOMY  2002   BREAST SURGERY     right 09/10/2007, left 06/16/2009   FOOT BONE EXCISION     SHOULDER ARTHROTOMY Left 2013   and 06/2015   TONSILLECTOMY  2012   Patient Active Problem List   Diagnosis Date Noted   History of asbestos exposure 03/02/2022   Mild mitral regurgitation 03/02/2022   Atherosclerosis of aorta ([place]) 03/02/2022   Agatston coronary artery calcium score between 200 and 399 03/02/2022   Essential hypertension 07/07/2021   Osteopenia after menopause 02/11/2021   Long-term current use of proton pump inhibitor therapy 11/29/2020   Postherpetic neuralgia 04/28/2020   History of breast cancer 02/18/2020   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   GERD (gastroesophageal reflux disease) 02/18/2020   Myalgia due to statin 02/18/2020   Mixed hyperlipidemia 02/18/2020    Colon polyp 02/18/2020   Multiple drug allergies 02/18/2020   Status post bilateral mastectomy 02/18/2020   Chronic midline low back pain without sciatica 02/18/2020   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   Post-traumatic arthritis of right foot 01/16/2020   Status post shoulder replacement, 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    PCP: Billey Chang, MD  REFERRING PROVIDER: Benay Pike, MD  REFERRING DIAG: s/p Bilateral Mastectomies with Right breast Cancer  THERAPY DIAG:  Stiffness of right shoulder, not elsewhere classified  Personal history of malignant neoplasm of breast  Aftercare following surgery for neoplasm  ONSET DATE: Sept 2023  Rationale for Evaluation and Treatment: Rehabilitation  SUBJECTIVE:  SUBJECTIVE STATEMENT: I love the DN. There is no sense of tightness right now. No present pain or tightness. I have my pre-op today and have to quarantine until my surgery so I had to cancel my DN appt.      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  She completed adjuvant chemotherapy with TAC, intolerant to Femara Arimidex and Aromasin, declined  tamoxifen.  She is BRCA1 mutation carrier and has had contralateral mastectomy and bilateral salpingo-oophorectomy.  She has been cancer free for the past 14 years.  She has a lumbar laminectomy/fusion scheduled for 08/29/2021. Right breast mastectomy 2009, and left 2010. 0/3 LN's removed on right.   PAIN:  Are you having pain?No NPRS scale: 1-2/10 just discomfort today Pain location: Right axillary border of pectorals Pain orientation: Right   PAIN TYPE: aching and tight Pain description: intermittent ,  Aggravating factors: caffeine Relieving factors: not drinking caffeine  PRECAUTIONS: Left TSA 2016, pending lumbar fusion in January, Right breast Cancer (triple Neg), arthrodesis Right Tarsometatarsal 2021,  WEIGHT BEARING RESTRICTIONS: No  FALLS:  Has patient fallen in last 6 months? No  LIVING ENVIRONMENT: Lives with: with significant other Lives in: House/apartment Stairs: Yes; Internal: 15 steps; on right going up,  3 steps to enter home. Has following equipment at home: Single point cane, Walker - 2 wheeled, shower chair, bed side commode, and knee walker  OCCUPATION: retired  Psychologist, clinical: art, Haematologist, gardening, crafts, building things  HAND DOMINANCE: right   PRIOR LEVEL OF FUNCTION: Independent  PATIENT GOALS: what she can do to alleviate discomfort in right axillary region   OBJECTIVE:  COGNITION: Overall cognitive status: Within functional limits for tasks assessed   PALPATION: Mild tenderness axillary border of pectorals, several small cords noted in axilla running to mid upper arm. Mild tenderness right pec minor and right axillary border of pectorals  OBSERVATIONS / OTHER ASSESSMENTS: well healed mastectomy incisions bilaterally,    SENSATION: Light touch: decreased at right lateral trunk   POSTURE: forward head rounded shoulders  UPPER EXTREMITY AROM/PROM:  A/PROM RIGHT   eval  Right 08/07/2022 Right 08/17/2022  Shoulder extension 56 55 60  Shoulder flexion 157 Pulls into upper arm 157 164  Shoulder abduction 150 pulls 168 172  Shoulder internal rotation 68    Shoulder external rotation 107      (Blank rows = not tested)  A/PROM LEFT   eval  Shoulder extension 41  Shoulder flexion 152  Shoulder abduction  NT Left TSA  Shoulder internal rotation   Shoulder external rotation     (Blank rows = not tested)  CERVICAL AROM: All within functional limits:      UPPER EXTREMITY  STRENGTH: WFL  LYMPHEDEMA ASSESSMENTS:   SURGERY TYPE/DATE: Bilateral Mastectomy for right breast Cancer without reconstruction  NUMBER OF LYMPH NODES REMOVED: 0/3  CHEMOTHERAPY: Yes TAC x 6, competed June 2009  RADIATION:No  HORMONE TREATMENT: No  INFECTIONS: NO  LYMPHEDEMA ASSESSMENTS:   LANDMARK RIGHT  eval  10 cm proximal to olecranon process 29.5  Olecranon process 25.3  10 cm proximal to ulnar styloid process 21.1  Just proximal to ulnar styloid process 14.6  Across hand at thumb web space 18.3  At base of 2nd digit 6.25  (Blank rows =  not tested)  LANDMARK LEFT  eval  10 cm proximal to olecranon process 29.3  Olecranon process 25  10 cm proximal to ulnar styloid process 20.0  Just proximal to ulnar styloid process 14.8  Across hand at thumb web space 18.1  At base of 2nd digit 6.3  (Blank rows = not tested)  QUICK DASH SURVEY: 9.09%   TODAY'S TREATMENT:    08/22/2022 Soft tissue mobilization to right UT, pectorals, serratus in supine and in SL to UT, scapular area, lats and serratus all with cocoa butter. MFR to right chest region in various directions Cording release to axillary region and right UE PROM right shoulder flexion , scaption abduction with release techniques for cording 4 D ball rolls on wall x 10 ea, wall clock with ball 12-6 x 5 reps, Red theraband Scapular retraction x 10. Attempted wall lift offs but pt was compensating so discontinued  12/29 Manual therapy: soft tissue mobilization to cervical paraspinals, right and left upper trap , levator scap Trigger Point Dry-Needling  Treatment instructions: Expect mild to moderate muscle soreness. S/S of pneumothorax if dry needled over a lung field, and to seek immediate medical attention should they occur. Patient verbalized understanding of these instructions and education.  Patient Consent Given: Yes Education handout provided: Yes Muscles treated: cervical right multifidi, right and left  upper trap; right upper trap anterior and posterior approach, right levator scap Electrical stimulation performed: No Parameters: N/A Treatment response/outcome: much improved soft tissue mobility with full abduction ROM noted with ease Seated upper trap stretch 20-30 sec hold Seated levator scap stretch 20-30 sec Seated scap retractions 10x Discussed avoidance of shoulder compensatory elevation (upper trap overuse)     08/17/2022 Soft tissue mobilization to right UT, pectorals, serratus in supine and in SL to UT, scapular area, lats and serratus all with cocoa butter. MFR to right chest region in various directions Cording release to axillary region and right UE PROM right shoulder flexion , scaption abduction with release techniques for cording Supine bilateral shoulder flexion yellow x 5, Right horizontal abd and ER yellow x 6 Standing scapular retraction and shoulder extension red band x 10  08/09/2022: Manual therapy: soft tissue mobilization to right pectorals, teres, rhomboids Trigger Point Dry-Needling  Treatment instructions: Expect mild to moderate muscle soreness. S/S of pneumothorax if dry needled over a lung field, and to seek immediate medical attention should they occur. Patient verbalized understanding of these instructions and education.  Patient Consent Given: Yes Education handout provided: Yes Muscles treated: right pectorals, teres, rhomboids, subscapularis Electrical stimulation performed: No Parameters: N/A Treatment response/outcome: much improved soft tissue mobility with full abduction ROM noted with ease Discussed performance of home stretching every 2-3 hours for 2-3 minutes either pulleys or on wall to maintain good improvements in ROM  08/07/2022  Pulleys for shoulder flexion and abduction x 1:30 Measured Shoulder ROM Soft tissue mobilization to right UT, pectorals, serratus in supine and in SL to UT, scapular area, lats and serratus all with cocoa  butter. MFR to right chest region in various directions Cording release to axillary region and right UE PROM right shoulder flexion , scaption abduction with release techniques for cording Ball rolls on wall 4 D x 15 Standing scapular retraction and shoulder extension red band x 10   08/03/2022 Soft tissue mobilization to right UT, pectorals, serratus in supine and in SL to UT, scapular area, lats and serratus all with cocoa butter. MFR to right chest region in various directions Cording  release to axillary region and right UE PROM right shoulder flexion , scaption abduction with release techniques for cording Supine AROM flexion, scaption, horizontal abd x 5 Supine single arm horizontal abd and ER x 8 with yellow.   PATIENT EDUCATION:  Education details: right UE Writer, wallslides Person educated: Patient Education method: Explanation, Demonstration, and Handouts Education comprehension: verbalized understanding and returned demonstration  HOME EXERCISE PROGRAM: Right UE stargazer and standing wall slides, SR, shoulder ext and ER red x 10 Access Code: [email or web address] URL: [email or web address] Date: 08/18/2022 Prepared by: Ruben Im  Exercises - Seated Upper Trapezius Stretch  - 3 x daily - 7 x weekly - 1 sets - 3 reps - 20-30 hold - Gentle Levator Scapulae Stretch (Mirrored)  - 3 x daily - 7 x weekly - 1 sets - 3 reps - 20-20 hold - Seated Scapular Retraction  - 1 x daily - 7 x weekly - 1 sets - 10 reps ASSESSMENT:  CLINICAL IMPRESSION: Pt got significant benefit from DN and notes good improvement in right shoulder ROM and decreased tightness. She had to cancel her last appt this week to quarantine prior to her back surgery. Minimal tightness noted in scapular area and lateral trunk today, but pectorals and upper traps feel great. Pt is pleased with progress. She is pending LB surgery on 08/29/2022 and will be on hold from PT until she is released by surgeon to  return. OBJECTIVE IMPAIRMENTS: decreased ROM, increased fascial restrictions, impaired flexibility, impaired sensation, and pain.   ACTIVITY LIMITATIONS: reach over head  PARTICIPATION LIMITATIONS:  pt does all that is required of her with tightness/ tenderness and mild pain  PERSONAL FACTORS: 3+ comorbidities:  BRCA1 Positive,s/p bilateral mastectomies, chronic LBP and LE pain pending surgery  , left TSA,Right foot T-M arthrodesis are also affecting patient's functional outcome.   REHAB POTENTIAL: Excellent CLINICAL DECISION MAKING: Stable/uncomplicated  EVALUATION COMPLEXITY: Low  GOALS: Goals reviewed with patient? Yes  SHORT TERM GOALS =LONG TERM GOALS: Target date: 08/21/2022  Pt will be independent and compliant with HEP for Right chest /upper body stretching/strengthening Baseline:  Goal status:MET 10/18/2021  2.  Pt will have decrease sensitivity/achiness by greater than 50% Baseline:  Goal status: IN PROGRESS (40% better overall.)  3.  Pt will restore Right shoulder ROM WNL without complaints of pulling/tighness Baseline:  Goal status: IN PROGRESS; improved today  PLAN:  PT FREQUENCY: 1-2x/week  PT DURATION:  8 week period( pt will be having back surgery on Jan. 9 and will not be able to come for therapy for 3 weeks or more. Anticipate no more than 5 weeks of sessions  PLANNED INTERVENTIONS: Therapeutic exercises, Therapeutic activity, Neuromuscular re-education, Patient/Family education, Self Care, scar mobilization, Manual therapy, and Re-evaluation, Dry needling  PLAN FOR NEXT SESSION:assess response to DN #2 ; Lats, UT, MFR to cording/chest region, consider cupping prn, review UE band ER, HA with Right UE, strengthening,update HEP(caution with left TSA);  upcoming lumbar fusion surgery 08/29/22  PHYSICAL THERAPY DISCHARGE SUMMARY  Visits from Start of Care: 9  Current functional level related to goals / functional outcomes: Pt was progressing with goals but had  not achieved all goals. Shoulder ROM was still mildly restricted and she had not quite met her sensitivity/achiness goal. She did have an excellent response to her first DN session   Remaining deficits: Shoulder ROM, sensitivity/achiness not yet 50% better   Education / Equipment: Theraband, HEP   Patient agrees to discharge. Patient goals were partially met. Patient  is being discharged due to a change in medical status.  Cheral Almas, PT 08/22/22 8:55 AM Phone: [phone] Fax: [phone]

## 2022-08-22 NOTE — Pre-Procedure Instructions (Signed)
Surgical Instructions   Your procedure is scheduled on Tuesday, 08/29/22 at 7:30 AM.   Report to [facility] Main Entrance "A" at 5:30 AM, then check in with the Admitting office.   Call this number if you have problems the morning of surgery:  [phone]  If you have any questions prior to your surgery date call [phone]: Open Monday-Friday 8am-4pm If you experience any cold or flu symptoms such as cough, fever, chills, shortness of breath, etc. between now and your scheduled surgery, please notify us at the above number.    Remember:  Do not eat after midnight the night before your surgery  You may drink clear liquids until 04:30 AM the morning of your surgery.   Clear liquids allowed are: Water, Non-Citrus Juices (without pulp), Carbonated Beverages, Clear Tea, Black Coffee Only (NO MILK, CREAM, POWDERED CREAMER or Honey)and Gatorade.    Take these medicines the morning of surgery with A SIP OF WATER: Metoprolol (Toprol-XL), Pregabalin (Lyrica), Omeprazole (Prilosec) - prn, Carisoprodol (Soma) - prn, Azelastine (Astelin) nasal spray - prn, Fluticasone (Flonase) nasal spray - prn  Follow your surgeon's instructions on when to stop Aspirin.  If no instructions were given by your surgeon then you will need to call the office to get those instructions.     As of today, STOP taking any Aleve, Naproxen, Ibuprofen, Motrin, Advil, Goody's, BC's, all herbal medications, fish oil (Vascepa), CoQ 10 (Ubiquinol) and all vitamins.                   Contacts, glasses, piercing's, hearing aid's, dentures or partials may not be worn into surgery, please bring cases for these belongings.    For patients admitted to the hospital, discharge time will be determined by your treatment team.  [place] VISITATION Patients having surgery or a procedure may have no more than 2 support people in the waiting area - these visitors may rotate.   Children under the age of 69 must have an adult with  them who is not the patient. If the patient needs to stay at the hospital during part of their recovery, the visitor guidelines for inpatient rooms apply. Pre-op nurse will coordinate an appropriate time for 1 support person to accompany patient in pre-op.  This support person may not rotate.   Please refer to the [facility] website for the visitor guidelines for Inpatients (after your surgery is over and you are in a regular room).   Special instructions:   [place]- Preparing For Surgery  Before surgery, you can play an important role. Because skin is not sterile, your skin needs to be as free of germs as possible. You can reduce the number of germs on your skin by washing with CHG (chlorahexidine gluconate) Soap before surgery.  CHG is an antiseptic cleaner which kills germs and bonds with the skin to continue killing germs even after washing.    Oral Hygiene is also important to reduce your risk of infection.  Remember - BRUSH YOUR TEETH THE MORNING OF SURGERY WITH YOUR REGULAR TOOTHPASTE  Please do not use if you have an allergy to CHG or antibacterial soaps. If your skin becomes reddened/irritated stop using the CHG.  Do not shave (including legs and underarms) for at least 48 hours prior to first CHG shower. It is OK to shave your face.  Please follow these instructions carefully.   Shower the NIGHT BEFORE SURGERY and the MORNING OF SURGERY  If you chose to wash your  hair, wash your hair first as usual with your normal shampoo.  After you shampoo, rinse your hair and body thoroughly to remove the shampoo.  Use CHG Soap as you would any other liquid soap. You can apply CHG directly to the skin and wash gently with a scrungie or a clean washcloth.   Apply the CHG Soap to your body ONLY FROM THE NECK DOWN.  Do not use on open wounds or open sores. Avoid contact with your eyes, ears, mouth and genitals (private parts). Wash Face and genitals (private parts)  with your normal soap.    Wash thoroughly, paying special attention to the area where your surgery will be performed.  Thoroughly rinse your body with warm water from the neck down.  DO NOT shower/wash with your normal soap after using and rinsing off the CHG Soap.  Pat yourself dry with a CLEAN TOWEL.  Wear CLEAN PAJAMAS to bed the night before surgery  Place CLEAN SHEETS on your bed the night before your surgery  DO NOT SLEEP WITH PETS.  Day of Surgery: Take a shower with CHG soap. Do not wear jewelry or makeup Do not wear lotions, powders, perfumes, or deodorant. Do not shave 48 hours prior to surgery.   Do not bring valuables to the hospital. [facility] is not responsible for any belongings or valuables. Do not wear nail polish, gel polish, artificial nails, or any other type of covering on natural nails (fingers and toes) If you have artificial nails or gel coating that need to be removed by a nail salon, please have this removed prior to surgery. Artificial nails or gel coating may interfere with anesthesia's ability to adequately monitor your vital signs. Wear Clean/Comfortable clothing the morning of surgery Remember to brush your teeth WITH YOUR REGULAR TOOTHPASTE.   Please read over the fact sheets that you were given.

## 2022-08-24 ENCOUNTER — Ambulatory Visit: Payer: Medicare Other | Admitting: Physical Therapy

## 2022-08-28 NOTE — Anesthesia Preprocedure Evaluation (Signed)
Anesthesia Evaluation  Patient identified by MRN, date of birth, ID band Patient awake    Reviewed: Allergy & Precautions, NPO status , Patient's Chart, lab work & pertinent test results, reviewed documented beta blocker date and time   History of Anesthesia Complications (+) PONV  Airway Mallampati: II  TM Distance: >3 FB Neck ROM: Full    Dental  (+) Dental Advisory Given   Pulmonary neg pulmonary ROS   breath sounds clear to auscultation       Cardiovascular hypertension, Pt. on medications and Pt. on home beta blockers (-) angina  Rhythm:Regular Rate:Normal  10/2021 ECHO: EF 60-65%, normal LVF, Grade 1 DD, normal RVF, normal MV, normal AV   Neuro/Psych negative neurological ROS     GI/Hepatic Neg liver ROS,GERD  Medicated and Controlled,,  Endo/Other  negative endocrine ROS    Renal/GU negative Renal ROS     Musculoskeletal  (+) Arthritis ,    Abdominal   Peds  Hematology negative hematology ROS (+)   Anesthesia Other Findings H/o breast cancer  Reproductive/Obstetrics                              Anesthesia Physical Anesthesia Plan  ASA: 3  Anesthesia Plan: General   Post-op Pain Management: Tylenol PO (pre-op)*   Induction: Intravenous  PONV Risk Score and Plan: 4 or greater and Ondansetron, Dexamethasone, Midazolam and Scopolamine patch - Pre-op  Airway Management Planned: Oral ETT  Additional Equipment: None  Intra-op Plan:   Post-operative Plan: Extubation in OR  Informed Consent: I have reviewed the patients History and Physical, chart, labs and discussed the procedure including the risks, benefits and alternatives for the proposed anesthesia with the patient or authorized representative who has indicated his/her understanding and acceptance.     Dental advisory given  Plan Discussed with: CRNA and Surgeon  Anesthesia Plan Comments:           Anesthesia Quick Evaluation

## 2022-08-29 ENCOUNTER — Ambulatory Visit (HOSPITAL_COMMUNITY): Payer: Medicare Other

## 2022-08-29 ENCOUNTER — Other Ambulatory Visit: Payer: Self-pay

## 2022-08-29 ENCOUNTER — Ambulatory Visit (HOSPITAL_BASED_OUTPATIENT_CLINIC_OR_DEPARTMENT_OTHER): Payer: Medicare Other | Admitting: Certified Registered Nurse Anesthetist

## 2022-08-29 ENCOUNTER — Ambulatory Visit (HOSPITAL_COMMUNITY): Payer: Medicare Other | Admitting: Certified Registered Nurse Anesthetist

## 2022-08-29 ENCOUNTER — Observation Stay (HOSPITAL_COMMUNITY)
Admission: RE | Admit: 2022-08-29 | Discharge: 2022-08-30 | Disposition: A | Discharge status: Home / Self Care | Payer: Medicare Other | Source: Ambulatory Visit | Attending: Cardiology | Admitting: Cardiology

## 2022-08-29 ENCOUNTER — Encounter (HOSPITAL_COMMUNITY): Payer: Self-pay | Admitting: Neurological Surgery

## 2022-08-29 ENCOUNTER — Observation Stay (HOSPITAL_BASED_OUTPATIENT_CLINIC_OR_DEPARTMENT_OTHER): Payer: Medicare Other

## 2022-08-29 ENCOUNTER — Encounter (HOSPITAL_COMMUNITY)
Admission: RE | Disposition: A | Discharge status: Home / Self Care | Payer: Self-pay | Source: Ambulatory Visit | Attending: Cardiology

## 2022-08-29 DIAGNOSIS — Z538 Procedure and treatment not carried out for other reasons: Secondary | ICD-10-CM | POA: Diagnosis not present

## 2022-08-29 DIAGNOSIS — I469 Cardiac arrest, cause unspecified: Secondary | ICD-10-CM | POA: Diagnosis not present

## 2022-08-29 DIAGNOSIS — I10 Essential (primary) hypertension: Secondary | ICD-10-CM | POA: Diagnosis not present

## 2022-08-29 DIAGNOSIS — M4316 Spondylolisthesis, lumbar region: Principal | ICD-10-CM | POA: Insufficient documentation

## 2022-08-29 DIAGNOSIS — M1990 Unspecified osteoarthritis, unspecified site: Secondary | ICD-10-CM | POA: Diagnosis not present

## 2022-08-29 DIAGNOSIS — Z853 Personal history of malignant neoplasm of breast: Secondary | ICD-10-CM | POA: Insufficient documentation

## 2022-08-29 DIAGNOSIS — I2510 Atherosclerotic heart disease of native coronary artery without angina pectoris: Secondary | ICD-10-CM | POA: Diagnosis not present

## 2022-08-29 DIAGNOSIS — Z79899 Other long term (current) drug therapy: Secondary | ICD-10-CM | POA: Insufficient documentation

## 2022-08-29 DIAGNOSIS — Z01818 Encounter for other preprocedural examination: Secondary | ICD-10-CM

## 2022-08-29 LAB — ECHOCARDIOGRAM COMPLETE
AR max vel: 1.83 cm2
AV Area VTI: 1.82 cm2
AV Area mean vel: 1.74 cm2
AV Mean grad: 3 mmHg
AV Peak grad: 4.5 mmHg
Ao pk vel: 1.06 m/s
Area-P 1/2: 2.66 cm2
Height: 59 in
Weight: 2243.2 oz

## 2022-08-29 LAB — TROPONIN I (HIGH SENSITIVITY)
Troponin I (High Sensitivity): 536 ng/L (ref <18)
Troponin I (High Sensitivity): 873 ng/L (ref <18)

## 2022-08-29 SURGERY — CANCELLED PROCEDURE
Anesthesia: General

## 2022-08-29 MED ORDER — LACTATED RINGERS IV SOLN: INTRAVENOUS | Status: DC

## 2022-08-29 MED ORDER — SODIUM CHLORIDE 0.9% FLUSH: 3.0000 mL | INTRAVENOUS | Status: DC | PRN

## 2022-08-29 MED ORDER — ONDANSETRON HCL 4 MG/2ML IJ SOLN: 4.0000 mg | Freq: Four times a day (QID) | INTRAMUSCULAR | Status: DC | PRN

## 2022-08-29 MED ORDER — BUPIVACAINE HCL (PF) 0.5 % IJ SOLN
INTRAMUSCULAR | Status: AC
  Filled 2022-08-29: qty 30

## 2022-08-29 MED ORDER — PROPOFOL 10 MG/ML IV BOLUS
INTRAVENOUS | Status: DC | PRN
  Administered 2022-08-29: 120 mg via INTRAVENOUS

## 2022-08-29 MED ORDER — CHLORHEXIDINE GLUCONATE CLOTH 2 % EX PADS: 6.0000 | MEDICATED_PAD | Freq: Once | CUTANEOUS | Status: DC

## 2022-08-29 MED ORDER — MUPIROCIN 2 % EX OINT: 1.0000 | TOPICAL_OINTMENT | Freq: Once | CUTANEOUS | Status: AC

## 2022-08-29 MED ORDER — ICOSAPENT ETHYL 1 G PO CAPS
2.0000 g | ORAL_CAPSULE | Freq: Two times a day (BID) | ORAL | Status: DC
  Administered 2022-08-29 – 2022-08-30 (×2): 2 g via ORAL
  Filled 2022-08-29 (×3): qty 2

## 2022-08-29 MED ORDER — FENTANYL CITRATE (PF) 250 MCG/5ML IJ SOLN
INTRAMUSCULAR | Status: DC | PRN
  Administered 2022-08-29 (×2): 50 ug via INTRAVENOUS
  Administered 2022-08-29: 150 ug via INTRAVENOUS

## 2022-08-29 MED ORDER — ACETAMINOPHEN 500 MG PO TABS
1000.0000 mg | ORAL_TABLET | Freq: Once | ORAL | Status: AC
  Administered 2022-08-29: 1000 mg via ORAL
  Filled 2022-08-29: qty 2

## 2022-08-29 MED ORDER — PROPOFOL 10 MG/ML IV BOLUS
INTRAVENOUS | Status: AC
  Filled 2022-08-29: qty 20

## 2022-08-29 MED ORDER — SODIUM CHLORIDE 0.9 % IV SOLN
250.0000 mL | INTRAVENOUS | Status: DC
  Administered 2022-08-29: 250 mL via INTRAVENOUS

## 2022-08-29 MED ORDER — PANTOPRAZOLE SODIUM 40 MG PO TBEC
40.0000 mg | DELAYED_RELEASE_TABLET | Freq: Every day | ORAL | Status: DC
  Administered 2022-08-29 – 2022-08-30 (×2): 40 mg via ORAL
  Filled 2022-08-29 (×2): qty 1

## 2022-08-29 MED ORDER — CARISOPRODOL 350 MG PO TABS: 350.0000 mg | ORAL_TABLET | Freq: Three times a day (TID) | ORAL | Status: DC | PRN

## 2022-08-29 MED ORDER — ACETAMINOPHEN 325 MG PO TABS: 650.0000 mg | ORAL_TABLET | ORAL | Status: DC | PRN

## 2022-08-29 MED ORDER — ACETAMINOPHEN 650 MG RE SUPP: 650.0000 mg | RECTAL | Status: DC | PRN

## 2022-08-29 MED ORDER — CEFAZOLIN SODIUM-DEXTROSE 2-4 GM/100ML-% IV SOLN
2.0000 g | INTRAVENOUS | Status: DC
  Filled 2022-08-29: qty 100

## 2022-08-29 MED ORDER — METOPROLOL SUCCINATE ER 25 MG PO TB24
25.0000 mg | ORAL_TABLET | Freq: Every morning | ORAL | Status: DC
  Administered 2022-08-30: 25 mg via ORAL
  Filled 2022-08-29: qty 1

## 2022-08-29 MED ORDER — MEPERIDINE HCL 25 MG/ML IJ SOLN: 6.2500 mg | INTRAMUSCULAR | Status: DC | PRN

## 2022-08-29 MED ORDER — MIDAZOLAM HCL 2 MG/2ML IJ SOLN
INTRAMUSCULAR | Status: AC
  Filled 2022-08-29: qty 2

## 2022-08-29 MED ORDER — CHLORHEXIDINE GLUCONATE 0.12 % MT SOLN
15.0000 mL | Freq: Once | OROMUCOSAL | Status: AC
  Administered 2022-08-29: 15 mL via OROMUCOSAL
  Filled 2022-08-29: qty 15

## 2022-08-29 MED ORDER — LOSARTAN POTASSIUM 50 MG PO TABS
50.0000 mg | ORAL_TABLET | Freq: Every day | ORAL | Status: DC
  Administered 2022-08-29: 50 mg via ORAL
  Filled 2022-08-29: qty 1

## 2022-08-29 MED ORDER — EPINEPHRINE PF 1 MG/ML IJ SOLN
INTRAMUSCULAR | Status: DC | PRN
  Administered 2022-08-29: .1 mg via INTRAVENOUS

## 2022-08-29 MED ORDER — TRAZODONE HCL 50 MG PO TABS
50.0000 mg | ORAL_TABLET | Freq: Every evening | ORAL | Status: DC | PRN
  Administered 2022-08-29: 50 mg via ORAL
  Filled 2022-08-29: qty 1

## 2022-08-29 MED ORDER — SUGAMMADEX SODIUM 200 MG/2ML IV SOLN
INTRAVENOUS | Status: DC | PRN
  Administered 2022-08-29: 250 mg via INTRAVENOUS

## 2022-08-29 MED ORDER — LIDOCAINE 2% (20 MG/ML) 5 ML SYRINGE
INTRAMUSCULAR | Status: DC | PRN
  Administered 2022-08-29: 20 mg via INTRAVENOUS

## 2022-08-29 MED ORDER — NALOXONE HCL 0.4 MG/ML IJ SOLN
INTRAMUSCULAR | Status: DC | PRN
  Administered 2022-08-29 (×2): 80 ug via INTRAVENOUS

## 2022-08-29 MED ORDER — SCOPOLAMINE 1 MG/3DAYS TD PT72
1.0000 | MEDICATED_PATCH | TRANSDERMAL | Status: DC
  Administered 2022-08-29: 1.5 mg via TRANSDERMAL
  Filled 2022-08-29: qty 1

## 2022-08-29 MED ORDER — MIDAZOLAM HCL 2 MG/2ML IJ SOLN: 0.5000 mg | Freq: Once | INTRAMUSCULAR | Status: DC | PRN

## 2022-08-29 MED ORDER — ASPIRIN 81 MG PO TBEC
81.0000 mg | DELAYED_RELEASE_TABLET | Freq: Every day | ORAL | Status: DC
  Administered 2022-08-30: 81 mg via ORAL
  Filled 2022-08-29: qty 1

## 2022-08-29 MED ORDER — SODIUM CHLORIDE 0.9% FLUSH
3.0000 mL | Freq: Two times a day (BID) | INTRAVENOUS | Status: DC
  Administered 2022-08-29 – 2022-08-30 (×2): 3 mL via INTRAVENOUS

## 2022-08-29 MED ORDER — MUPIROCIN 2 % EX OINT
TOPICAL_OINTMENT | CUTANEOUS | Status: AC
  Administered 2022-08-29: 1 via TOPICAL
  Filled 2022-08-29: qty 22

## 2022-08-29 MED ORDER — EPHEDRINE SULFATE (PRESSORS) 50 MG/ML IJ SOLN
INTRAMUSCULAR | Status: DC | PRN
  Administered 2022-08-29: 5 mg via INTRAVENOUS

## 2022-08-29 MED ORDER — PROMETHAZINE HCL 25 MG/ML IJ SOLN: 6.2500 mg | INTRAMUSCULAR | Status: DC | PRN

## 2022-08-29 MED ORDER — THROMBIN 5000 UNITS EX SOLR
CUTANEOUS | Status: AC
  Filled 2022-08-29: qty 5000

## 2022-08-29 MED ORDER — LACTATED RINGERS IV SOLN: INTRAVENOUS | Status: DC | PRN

## 2022-08-29 MED ORDER — MELATONIN 3 MG PO TABS
3.0000 mg | ORAL_TABLET | Freq: Every evening | ORAL | Status: DC | PRN
  Administered 2022-08-29: 3 mg via ORAL
  Filled 2022-08-29: qty 1

## 2022-08-29 MED ORDER — POLYETHYLENE GLYCOL 3350 17 G PO PACK: 17.0000 g | PACK | Freq: Every day | ORAL | Status: DC | PRN

## 2022-08-29 MED ORDER — DOCUSATE SODIUM 100 MG PO CAPS
100.0000 mg | ORAL_CAPSULE | Freq: Two times a day (BID) | ORAL | Status: DC
  Administered 2022-08-29 – 2022-08-30 (×2): 100 mg via ORAL
  Filled 2022-08-29 (×2): qty 1

## 2022-08-29 MED ORDER — PHENOL 1.4 % MT LIQD: 1.0000 | OROMUCOSAL | Status: DC | PRN

## 2022-08-29 MED ORDER — ROCURONIUM BROMIDE 10 MG/ML (PF) SYRINGE
PREFILLED_SYRINGE | INTRAVENOUS | Status: DC | PRN
  Administered 2022-08-29: 50 mg via INTRAVENOUS

## 2022-08-29 MED ORDER — HYDROMORPHONE HCL 1 MG/ML IJ SOLN: 0.2500 mg | INTRAMUSCULAR | Status: DC | PRN

## 2022-08-29 MED ORDER — FENTANYL CITRATE (PF) 250 MCG/5ML IJ SOLN
INTRAMUSCULAR | Status: AC
  Filled 2022-08-29: qty 5

## 2022-08-29 MED ORDER — EZETIMIBE 10 MG PO TABS
10.0000 mg | ORAL_TABLET | Freq: Every day | ORAL | Status: DC
  Administered 2022-08-29: 10 mg via ORAL
  Filled 2022-08-29: qty 1

## 2022-08-29 MED ORDER — MIDAZOLAM HCL 5 MG/5ML IJ SOLN
INTRAMUSCULAR | Status: DC | PRN
  Administered 2022-08-29 (×2): 1 mg via INTRAVENOUS

## 2022-08-29 MED ORDER — ONDANSETRON HCL 4 MG PO TABS: 4.0000 mg | ORAL_TABLET | Freq: Four times a day (QID) | ORAL | Status: DC | PRN

## 2022-08-29 MED ORDER — MENTHOL 3 MG MT LOZG: 1.0000 | LOZENGE | OROMUCOSAL | Status: DC | PRN

## 2022-08-29 MED ORDER — PHENYLEPHRINE HCL (PRESSORS) 10 MG/ML IV SOLN
INTRAVENOUS | Status: DC | PRN
  Administered 2022-08-29 (×2): 160 ug via INTRAVENOUS

## 2022-08-29 MED ORDER — LIDOCAINE-EPINEPHRINE 1 %-1:100000 IJ SOLN
INTRAMUSCULAR | Status: AC
  Filled 2022-08-29: qty 1

## 2022-08-29 MED ORDER — PREGABALIN 25 MG PO CAPS
25.0000 mg | ORAL_CAPSULE | Freq: Two times a day (BID) | ORAL | Status: DC
  Administered 2022-08-29 – 2022-08-30 (×2): 25 mg via ORAL
  Filled 2022-08-29 (×2): qty 1

## 2022-08-29 MED ORDER — ORAL CARE MOUTH RINSE: 15.0000 mL | Freq: Once | OROMUCOSAL | Status: AC

## 2022-08-29 SURGICAL SUPPLY — 67 items
BAG COUNTER SPONGE SURGICOUNT (BAG) ×2 IMPLANT
BAND RUBBER #18 3X1/16 STRL (MISCELLANEOUS) ×2 IMPLANT
BASKET BONE COLLECTION (BASKET) ×1 IMPLANT
BENZOIN TINCTURE PRP APPL 2/3 (GAUZE/BANDAGES/DRESSINGS) IMPLANT
BLADE CLIPPER SURG (BLADE) IMPLANT
BLADE SURG 11 STRL SS (BLADE) ×1 IMPLANT
BUR 14 MATCH 3 (BUR) IMPLANT
BUR MATCHSTICK NEURO 3.0 LAGG (BURR) ×2 IMPLANT
BUR MR8 14CM BALL SYMTRI 5 (BUR) IMPLANT
BUR PRECISION FLUTE 5.0 (BURR) ×2 IMPLANT
BURR 14 MATCH 3 (BUR)
BURR MR8 14CM BALL SYMTRI 5 (BUR)
CANISTER SUCT 3000ML PPV (MISCELLANEOUS) ×1 IMPLANT
CNTNR URN SCR LID CUP LEK RST (MISCELLANEOUS) ×1 IMPLANT
CONT SPEC 4OZ STRL OR WHT (MISCELLANEOUS)
COVER BACK TABLE 60X90IN (DRAPES) ×1 IMPLANT
COVERAGE SUPPORT O-ARM STEALTH (MISCELLANEOUS) IMPLANT
DERMABOND ADVANCED .7 DNX12 (GAUZE/BANDAGES/DRESSINGS) ×2 IMPLANT
DRAPE C-ARM 42X72 X-RAY (DRAPES) IMPLANT
DRAPE C-ARMOR (DRAPES) IMPLANT
DRAPE LAPAROTOMY 100X72X124 (DRAPES) ×2 IMPLANT
DRAPE MICROSCOPE SLANT 54X150 (MISCELLANEOUS) ×2 IMPLANT
DRAPE SHEET LG 3/4 BI-LAMINATE (DRAPES) ×4 IMPLANT
DRAPE SURG 17X23 STRL (DRAPES) ×2 IMPLANT
DURAPREP 26ML APPLICATOR (WOUND CARE) ×2 IMPLANT
ELECT REM PT RETURN 9FT ADLT (ELECTROSURGICAL) ×1
ELECTRODE REM PT RTRN 9FT ADLT (ELECTROSURGICAL) ×2 IMPLANT
FEE COVERAGE SUPPORT O-ARM (MISCELLANEOUS) ×1 IMPLANT
GAUZE 4X4 16PLY ~~LOC~~+RFID DBL (SPONGE) IMPLANT
GAUZE SPONGE 4X4 12PLY STRL (GAUZE/BANDAGES/DRESSINGS) IMPLANT
GLOVE BIOGEL PI IND STRL 7.5 (GLOVE) ×4 IMPLANT
GLOVE ECLIPSE 7.5 STRL STRAW (GLOVE) ×4 IMPLANT
GLOVE EXAM NITRILE LRG STRL (GLOVE) IMPLANT
GLOVE EXAM NITRILE XL STR (GLOVE) IMPLANT
GLOVE EXAM NITRILE XS STR PU (GLOVE) IMPLANT
GOWN STRL REUS W/ TWL LRG LVL3 (GOWN DISPOSABLE) ×8 IMPLANT
GOWN STRL REUS W/ TWL XL LVL3 (GOWN DISPOSABLE) IMPLANT
GOWN STRL REUS W/TWL 2XL LVL3 (GOWN DISPOSABLE) IMPLANT
GOWN STRL REUS W/TWL LRG LVL3 (GOWN DISPOSABLE) ×4
GOWN STRL REUS W/TWL XL LVL3 (GOWN DISPOSABLE)
HEMOSTAT POWDER KIT SURGIFOAM (HEMOSTASIS) ×1 IMPLANT
KIT BASIN OR (CUSTOM PROCEDURE TRAY) ×2 IMPLANT
KIT POSITION SURG JACKSON T1 (MISCELLANEOUS) ×2 IMPLANT
KIT TURNOVER KIT B (KITS) ×2 IMPLANT
MARKER SPHERE PSV REFLC NDI (MISCELLANEOUS) ×5 IMPLANT
MILL BONE PREP (MISCELLANEOUS) IMPLANT
NDL HYPO 18GX1.5 BLUNT FILL (NEEDLE) IMPLANT
NDL SPNL 18GX3.5 QUINCKE PK (NEEDLE) IMPLANT
NEEDLE HYPO 18GX1.5 BLUNT FILL (NEEDLE) IMPLANT
NEEDLE HYPO 22GX1.5 SAFETY (NEEDLE) ×2 IMPLANT
NEEDLE SPNL 18GX3.5 QUINCKE PK (NEEDLE) IMPLANT
NS IRRIG 1000ML POUR BTL (IV SOLUTION) ×1 IMPLANT
PACK LAMINECTOMY NEURO (CUSTOM PROCEDURE TRAY) ×2 IMPLANT
PAD ARMBOARD 7.5X6 YLW CONV (MISCELLANEOUS) ×6 IMPLANT
SPIKE FLUID TRANSFER (MISCELLANEOUS) ×2 IMPLANT
SPONGE SURGIFOAM ABS GEL 100 (HEMOSTASIS) IMPLANT
SPONGE T-LAP 4X18 ~~LOC~~+RFID (SPONGE) IMPLANT
STRIP CLOSURE SKIN 1/2X4 (GAUZE/BANDAGES/DRESSINGS) IMPLANT
SUT MNCRL AB 3-0 PS2 18 (SUTURE) ×1 IMPLANT
SUT VIC AB 0 CT1 18XCR BRD8 (SUTURE) ×2 IMPLANT
SUT VIC AB 0 CT1 8-18 (SUTURE) ×1
SUT VIC AB 2-0 CP2 18 (SUTURE) ×1 IMPLANT
SYR 3ML LL SCALE MARK (SYRINGE) IMPLANT
TOWEL GREEN STERILE (TOWEL DISPOSABLE) ×2 IMPLANT
TOWEL GREEN STERILE FF (TOWEL DISPOSABLE) ×2 IMPLANT
TRAY FOLEY MTR SLVR 16FR STAT (SET/KITS/TRAYS/PACK) ×1 IMPLANT
WATER STERILE IRR 1000ML POUR (IV SOLUTION) ×1 IMPLANT

## 2022-08-29 NOTE — Anesthesia Procedure Notes (Signed)
Procedure Name: Intubation Date/Time: 08/29/2022 8:03 AM  Performed by: Wilburn Cornelia, CRNAPre-anesthesia Checklist: Patient identified, Emergency Drugs available, Suction available, Patient being monitored and Timeout performed Patient Re-evaluated:Patient Re-evaluated prior to induction Oxygen Delivery Method: Circle system utilized Preoxygenation: Pre-oxygenation with 100% oxygen Induction Type: IV induction Ventilation: Oral airway inserted - appropriate to patient size and Mask ventilation without difficulty Laryngoscope Size: Mac and 3 Grade View: Grade II Tube type: Oral Tube size: 7.0 mm Number of attempts: 1 Airway Equipment and Method: Stylet Placement Confirmation: ETT inserted through vocal cords under direct vision, positive ETCO2, CO2 detector and breath sounds checked- equal and bilateral Secured at: 21 cm Tube secured with: Tape Dental Injury: Teeth and Oropharynx as per pre-operative assessment

## 2022-08-29 NOTE — Progress Notes (Signed)
CRITICAL RESULT PROVIDER NOTIFICATION  Test performed and critical result:  Troponin 536  Date and time result received:  08/29/2021, 1359  Provider name/title: Dr. Stanford Breed  Date and time provider notified: 08/29/2021, 1400  Date and time provider responded: 08/29/2021, 1400  Provider response:No new orders

## 2022-08-29 NOTE — Anesthesia Postprocedure Evaluation (Signed)
Anesthesia Post Note  Patient: Shannon Osborne  Procedure(s) Performed: L5-S1 Laminectomy/TLIF/posterolateral instrumented fusion Application of O-Arm     Patient location during evaluation: PACU Anesthesia Type: General Level of consciousness: awake and alert, patient cooperative and oriented Pain management: pain level controlled Vital Signs Assessment: post-procedure vital signs reviewed and stable Respiratory status: spontaneous breathing, nonlabored ventilation and respiratory function stable Cardiovascular status: blood pressure returned to baseline and stable Postop Assessment: no apparent nausea or vomiting Anesthetic complications: no Comments: Appreciate cardiology input for Asystolic episode on initial IV induction. Admitting to monitored bed for further cardiology eval. Pt and significant other informed and appreciative of care   No notable events documented.  Last Vitals:  Vitals:   08/29/22 1000 08/29/22 1015  BP: (!) 139/96 127/73  Pulse: 83 73  Resp: (!) 21 16  Temp: 36.6 C   SpO2: 96% 98%    Last Pain:  Vitals:   08/29/22 1000  PainSc: 0-No pain                 Somtochukwu Woollard,E. Kileen Lange

## 2022-08-29 NOTE — Progress Notes (Signed)
  Echocardiogram 2D Echocardiogram has been performed.  Shannon Osborne 08/29/2022, 5:16 PM

## 2022-08-29 NOTE — H&P (Signed)
Surgical H&P Update  HPI: 67 y.o. with a history of low back and BLE pain with claudication. Workup showed grade 2 spondy with severe stenosis. No changes in health since they were last seen. Still having the above and wishes to proceed with surgery.  PMHx:  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   Breast cancer ([place]) 2009   BRACA 1 Pos    Chronic midline low back pain without sciatica 02/18/2020   Family history of adverse reaction to anesthesia    mother is slow to wake up   GERD (gastroesophageal reflux disease) 02/18/2020   Hyperlipemia    Hypertension    Mitral valve prolapse    Multiple drug allergies 02/18/2020   Myalgia due to statin 02/18/2020   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   MRI 05/2018   Osteopenia after menopause 02/11/2021   DEXA 01/2021 lowest T = -1.7 femur; recheck 2-3 years   Pneumonia    PONV (postoperative nausea and vomiting)    after most recent colonoscopy (2020)   FamHx:  Family History  Problem Relation Age of Onset   Hypertension Mother    Colon polyps Mother    Gallstones Mother    Stroke Father    Hypertension Brother    Gallstones Brother    Hypertension Brother    Gallstones Brother    Hypertension Brother    Gallstones Brother    SocHx:  reports that she has never smoked. She has been exposed to tobacco smoke. She has never used smokeless tobacco. She reports current alcohol use. She reports that she does not use drugs.  Physical Exam: Strength 5/5 x4 and SILTx4   Assesment/Plan: 67 y.o. woman with grade 2 spondy and severe stenosis, here for open decompression / TLIF / PLF. Risks, benefits, and alternatives discussed and the patient would like to continue with surgery.  -OR today -3C post-op  Judith Part, MD 08/29/22 7:46 AM

## 2022-08-29 NOTE — H&P (Addendum)
Cardiology Admission History and Physical   Patient ID: Shannon Osborne MRN: [id number]; DOB: 1955-12-29   Admission date: 08/29/2022  PCP:  Leamon Arnt, MD   [place] [facility] Cardiologist:  Freada Bergeron, MD        Chief Complaint:  Asystole  Patient Profile:   Shannon Osborne is a 67 y.o. female with hyperlipidemia intolerant of statins, hypertension, history of breast cancer s/p chemo  who is being seen 08/29/2022 for the evaluation of Asystole.  History of Present Illness:   Shannon Osborne is a pleasant 67 year old female with past medical history of hyperlipidemia intolerant of statins, hypertension, history of breast cancer s/p chemo.  Previous coronary calcium scoring test in February 2016 showed coronary calcium score of 253 which placed the patient at 90th percentile for age and sex matched control.  She has trialed simvastatin, Crestor, Lipitor and Livalo and could not tolerate any of them.  Patient was referred to Dr. Johney Frame in February 2023 when her shoulder CT scan revealed atherosclerosis of the aorta, left subclavian artery and coronary arteries.  She denied any exertional chest pain at the time.  Subsequent echocardiogram obtained on 10/27/2021 showed EF 60 to 65%, RVSP 24.7 mmHg, mild MR, normal RV, grade 1 DD.   Patient was scheduled to undergo back surgery by Dr. Zada Finders.  She denied any recent chest pain or shortness of breath.  She does have a chronic right ear ringing sensation which causes some degree of imbalance (propensity to lean to the right side) but no significant dizziness.  She denies any recent blurry vision or feeling of passing out.  She does have a sinus issue recently.  She presented for her back surgery on 08/29/2021.  She was initially given a dose of Versed followed by 2 doses of propofol.  After the second dose of propofol, anesthesiologist subsequently noted the patient was asystolic on telemetry.  Leads were checked to make sure they were  on. Prior to asystole, there was no reported bradycardia or tachycardia. BP prior to asystole was normal. Unfortunately no strip was obtained.  Prior to CPR can be performed, after about 45 seconds from the onset of event, patient came out of asystole and was in bradycardia with heart rate in the 20s.  She was given a single dose of 100 amps of epinephrine and was intubated.  Blood pressure and heart rate later normalized and the patient was subsequently extubated.  Cardiology service consulted for asystole.  Again, no strips is available to review.  Preop EKG obtained on 08/22/2022 showed normal sinus rhythm, no significant ST wave changes, peaked T wave.  Basic metabolic panel obtained on the same day showed normal renal function and electrolyte, potassium 4.1.  Recent hemoglobin obtained on the same day was 11.8.  Note, patient previously underwent foot surgery at [facility] and also colonoscopy and used propofol during both surgeries without issue.     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 Authorizing Provider  aspirin EC 81 MG tablet Take 81 mg by mouth daily. Swallow whole.   Yes [provider]  azelastine (ASTELIN) 0.1 % nasal spray Place 1-2 sprays into both nostrils daily as needed for rhinitis or allergies. 10/07/21  Yes [provider]  carisoprodol (SOMA) 350 MG tablet Take 1 tablet (350 mg total) by mouth 3 (three) times daily as needed for muscle spasms. Patient taking differently: Take 175-350 mg by mouth 3 (three) times daily as needed for muscle spasms. 09/26/21  Yes Leamon Arnt, MD  Cholecalciferol (VITAMIN D) 50 MCG (2000 UT) CAPS Take 2,000 Units by mouth daily.   Yes [provider]  Evolocumab (REPATHA SURECLICK) 220 MG/ML SOAJ Inject 140 mg into the skin every 14 (fourteen) days. 11/01/21  Yes Freada Bergeron, MD  ezetimibe (ZETIA) 10 MG tablet TAKE 1 TABLET AT BEDTIME 07/24/22  Yes Leamon Arnt, MD  fluticasone (FLONASE SENSIMIST) 27.5 MCG/SPRAY nasal spray Place 2 sprays into the nose daily as needed for rhinitis or allergies.   Yes [provider]  icosapent Ethyl (VASCEPA) 1 g capsule Take 2 capsules (2 g total) by mouth 2 (two) times daily. 04/25/22  Yes Freada Bergeron, MD  losartan (COZAAR) 50 MG tablet TAKE 1 TABLET(50 MG) BY MOUTH DAILY 06/27/22  Yes Leamon Arnt, MD  melatonin 3 MG TABS tablet Take 3 mg by mouth at bedtime as needed (sleep).   Yes [provider]  metoprolol succinate (TOPROL-XL) 25 MG 24 hr tablet Take 25 mg by mouth every morning.   Yes [provider]  Multiple Vitamin (MULTIVITAMIN WITH MINERALS) TABS tablet Take 1 tablet by mouth daily.   Yes [provider]  omeprazole (PRILOSEC) 20 MG capsule TAKE 1 CAPSULE(20 MG) BY MOUTH DAILY Patient taking differently: Take 20 mg by mouth daily as needed (acid reflux). 06/27/22  Yes Leamon Arnt, MD  pregabalin (LYRICA) 25  MG capsule Take 25 mg by mouth 2 (two) times daily. 08/05/22  Yes [provider]  Ubiquinol 100 MG CAPS Take 100 mg by mouth daily.   Yes [provider]  acidophilus (RISAQUAD) CAPS capsule Take 1 capsule by mouth daily.    [provider]  phenazopyridine (PYRIDIUM) 200 MG tablet Take 1 tablet (200 mg total) by mouth 3 (three) times daily as needed for pain. Patient not taking: Reported on 08/18/2022 11/01/21   Mickie Hillier, PA-C  traZODone (DESYREL) 50 MG tablet Take 50 mg by mouth at bedtime as needed for sleep.    [provider]     Allergies:    Allergies  Allergen Reactions   Baclofen Other (See Comments)    insomnia   Cyclobenzaprine Other (See Comments)    Insomnia   Escitalopram Oxalate Other (See Comments)   Iodine Other (See Comments)  Rosuvastatin Other (See Comments)   Sertraline Other (See Comments)    "like taking caffeine"   Simvastatin Other (See Comments)   Atorvastatin Other (See Comments)    myalgia   Codeine Rash   Contrast Media [Iodinated Contrast Media] Rash    Chest pressure    Hydrochlorothiazide W-Triamterene Rash   Hydrocodone-Acetaminophen Rash   Oxycodone Hcl Rash    Social History:   Social History   Socioeconomic History   Marital status: Single    Spouse name: Not on file   Number of children: Not on file   Years of education: Not on file   Highest education level: Not on file  Occupational History   Not on file  Tobacco Use   Smoking status: Never    Passive exposure: Past   Smokeless tobacco: Never  Vaping Use   Vaping Use: Never used  Substance and Sexual Activity   Alcohol use: Yes    Comment: 10 drinks a year    Drug use: Never   Sexual activity: Yes    Partners: Male  Other Topics Concern   Not on file  Social History Narrative   Not on file   Social Determinants of Health   Financial Resource Strain: Low Risk  (02/18/2021)   Overall Financial Resource Strain (CARDIA)     Difficulty of Paying Living Expenses: Not hard at all  Food Insecurity: No Food Insecurity (02/18/2021)   Hunger Vital Sign    Worried About Running Out of Food in the Last Year: Never true    [place] in the Last Year: Never true  Transportation Needs: No Transportation Needs (02/18/2021)   PRAPARE - Hydrologist (Medical): No    Lack of Transportation (Non-Medical): No  Physical Activity: Sufficiently Active (02/18/2021)   Exercise Vital Sign    Days of Exercise per Week: 6 days    Minutes of Exercise per Session: 60 min  Stress: No Stress Concern Present (02/18/2021)   [place]    Feeling of Stress : Not at all  Social Connections: Socially Isolated (02/18/2021)   Social Connection and Isolation Panel [NHANES]    Frequency of Communication with Friends and Family: More than three times a week    Frequency of Social Gatherings with Friends and Family: More than three times a week    Attends Religious Services: Never    Marine scientist or Organizations: No    Attends Archivist Meetings: Never    Marital Status: Never married  Intimate Partner Violence: Not At Risk (02/18/2021)   Humiliation, Afraid, Rape, and Kick questionnaire    Fear of Current or Ex-Partner: No    Emotionally Abused: No    Physically Abused: No    Sexually Abused: No    Family History:   The patient's family history includes Colon polyps in her mother; Gallstones in her brother, brother, brother, and mother; Hypertension in her brother, brother, brother, and mother; Stroke in her father.    ROS:  Please see the history of present illness.  All other ROS reviewed and negative.     Physical Exam/Data:   Vitals:   08/29/22 0900 08/29/22 0915 08/29/22 0930 08/29/22 0945  BP: 128/72 129/74 126/69 (!) 141/87  Pulse: 82 79 74 82  Resp: '19 15 17 18  '$ Temp:      SpO2: 98% 99% 98% 97%  Weight:  Height:        Intake/Output Summary (Last 24 hours) at 08/29/2022 1007 Last data filed at 08/29/2022 0828 Gross per 24 hour  Intake 650 ml  Output --  Net 650 ml      08/29/2022    5:59 AM 08/22/2022   12:50 PM 06/08/2022    9:12 AM  Last 3 Weights  Weight (lbs) 140 lb 3.2 oz 144 lb 143 lb 9 oz  Weight (kg) 63.594 kg 65.318 kg 65.12 kg     Body mass index is 28.32 kg/m.  General:  Well nourished, well developed, in no acute distress HEENT: normal Neck: no JVD Vascular: No carotid bruits; Distal pulses 2+ bilaterally   Cardiac:  normal S1, S2; RRR; no murmur  Lungs:  clear to auscultation bilaterally, no wheezing, rhonchi or rales  Abd: soft, nontender, no hepatomegaly  Ext: no edema Musculoskeletal:  No deformities, BUE and BLE strength normal and equal Skin: warm and dry  Neuro:  CNs 2-12 intact, no focal abnormalities noted Psych:  Normal affect    EKG:  The ECG that was done on 08/22/2022 for preop and was personally reviewed and demonstrates EKG obtained on 1/2 NSR without significant ST-T wave changes   Relevant CV Studies:  Echo 10/27/2021  1. Left ventricular ejection fraction, by estimation, is 60 to 65%. Left  ventricular ejection fraction by 3D volume is 65 %. The left ventricle has  normal function. The left ventricle has no regional wall motion  abnormalities. Left ventricular diastolic   parameters are consistent with Grade I diastolic dysfunction (impaired  relaxation). Elevated left ventricular end-diastolic pressure. The average  left ventricular global longitudinal strain is -20.4 %. The global  longitudinal strain is normal.   2. Right ventricular systolic function is normal. The right ventricular  size is normal. There is normal pulmonary artery systolic pressure. The  estimated right ventricular systolic pressure is 42.6 mmHg.   3. The mitral valve is normal in structure. Mild mitral valve  regurgitation. No evidence of mitral stenosis.   4. The aortic  valve is normal in structure. Aortic valve regurgitation is  not visualized. No aortic stenosis is present.   5. The inferior vena cava is normal in size with greater than 50%  respiratory variability, suggesting right atrial pressure of 3 mmHg.   Laboratory Data:  High Sensitivity Troponin:  No results for input(s): "TROPONINIHS" in the last 720 hours.    Chemistry Recent Labs  Lab 08/22/22 1400  NA 136  K 4.1  CL 103  CO2 27  GLUCOSE 102*  BUN 13  CREATININE 0.79  CALCIUM 8.9  GFRNONAA >60  ANIONGAP 6    No results for input(s): "PROT", "ALBUMIN", "AST", "ALT", "ALKPHOS", "BILITOT" in the last 168 hours. Lipids No results for input(s): "CHOL", "TRIG", "HDL", "LABVLDL", "LDLCALC", "CHOLHDL" in the last 168 hours. Hematology Recent Labs  Lab 08/22/22 1400  WBC 7.1  RBC 4.00  HGB 11.8*  HCT 36.3  MCV 90.8  MCH 29.5  MCHC 32.5  RDW 12.1  PLT 268   Thyroid No results for input(s): "TSH", "FREET4" in the last 168 hours. BNPNo results for input(s): "BNP", "PROBNP" in the last 168 hours.  DDimer No results for input(s): "DDIMER" in the last 168 hours.   Radiology/Studies:  No results found.   Assessment and Plan:   Asystole: Reportedly had asystole after second dose of propofol. There was no drop in BP prior to the event, no tachyarrhythmia or bradycardia prior  to the event. Prior to CPR can be performed, patient came to with sinus bradycardia HR 20s, 100 amps of epi was given and patient was intubated, but later extubated without issue. Now BP is borderline high. Patient denies any recent severe dizziness or feeling of passing out, no recent exertional chest pain or SOB. She walked 10000 steps every day. Great functional ability. Unclear if asystole could be a vagal induced. Seen by Dr. Stanford Breed, admit overnight for obs, obtain serial trop and echocardiogram. If normal, likely can be discharged. Prior to repeat back surgery in the future, may consider coronary CT, however  probability of severe CAD fairly low given no VT/VF prior to asystole   HTN: took home toprol XL '25mg'$  this morning, hole losartan '50mg'$  was held this morning, will resume as BP allows   HLD: intolerant to statins. On Repatha   Risk Assessment/Risk Scores:    Severity of Illness: The appropriate patient status for this patient is OBSERVATION. Observation status is judged to be reasonable and necessary in order to provide the required intensity of service to ensure the patient's safety. The patient's presenting symptoms, physical exam findings, and initial radiographic and laboratory data in the context of their medical condition is felt to place them at decreased risk for further clinical deterioration. Furthermore, it is anticipated that the patient will be medically stable for discharge from the hospital within 2 midnights of admission.    For questions or updates, please contact [place] Please consult [email or web address] for contact info under    Signed, Almyra Deforest, [place]  08/29/2022 10:07 AM  As above, patient seen and examined.  Briefly she is a 68 year old female with past medical history of hypertension, previous breast cancer status postchemotherapy, hyperlipidemia, mildly elevated calcium score admitted with episode of asystole.  Patient typically does not have dyspnea on exertion, orthopnea, PND, pedal edema, palpitations or syncope.  She does not have exertional chest pain.  Echocardiogram March 2023 showed normal LV function, grade 1 diastolic dysfunction, mild mitral regurgitation.  Patient presented for back surgery today.  Prior to surgery she was given Versed and propofol.  She is subsequently by report developed asystolic episode for approximately 45 seconds.  She then was noted to be bradycardic with heart rate in the 20s.  She was given epinephrine and intubated.  Her heart rate improved and she was subsequently extubated.  Cardiology now asked to evaluate.  Note she has not had  chest pain, palpitations, shortness of breath or nausea.  Prior to her episode a jaw thrust was performed prior to bagging. Preoperative ECG January 2 showed normal sinus rhythm with no ST changes and no conduction abnormalities.  Laboratories showed potassium 4.1, hemoglobin 11.8, glucose 102.  1 episode of asystole-by report patient had an episode of asystole following administration of propofol and Versed.  I do not have any rhythm strips recorded.  She then developed sinus bradycardia at a heart rate of 20.  She was given epinephrine with resolution of bradycardia.  She has not had any symptoms.  She has no history of syncope.  She also has no cardiac symptoms despite being very active.  Will admit to telemetry and monitor overnight.  Cycle enzymes though doubt ischemia.  Arrange echocardiogram to reassess LV function.  If no significant arrhythmias noted on telemetry will likely discharge tomorrow morning.  Will consider outpatient cardiac CTA to rule out obstructive coronary disease.  This episode is likely vagal in etiology.  2 hypertension-patient is on losartan  and Toprol at home.  Will resume at discharge if heart rate and blood pressure stable.  3 history of coronary calcification-continue aspirin, Zetia and Repatha (intolerant to statins).  Kirk Ruths, MD

## 2022-08-29 NOTE — Progress Notes (Signed)
Pt asystolic with propofol administration. Epi given, pt intubated, HR and BP stabilized. Discussed with Dr. Zada Finders. Case postponed pending Cardiology consultation. Pt reversed, awakened, and extubated.  Pt stable and awake in PACU. Cardiology consulted. Discussed with patient and her family   Jenita Seashore, MD

## 2022-08-29 NOTE — Progress Notes (Signed)
Pt had intra-op period of asystole during induction, case cancelled, will admit and work up further.

## 2022-08-29 NOTE — Transfer of Care (Signed)
Immediate Anesthesia Transfer of Care Note  Patient: Shannon Osborne  Procedure(s) Performed: L5-S1 Laminectomy/TLIF/posterolateral instrumented fusion Application of O-Arm  Patient Location: PACU  Anesthesia Type:General  Level of Consciousness: awake, alert , and oriented  Airway & Oxygen Therapy: Patient Spontanous Breathing and Patient connected to face mask oxygen  Post-op Assessment: Report given to RN and Post -op Vital signs reviewed and stable  Post vital signs: Reviewed and stable  Last Vitals:  Vitals Value Taken Time  BP 108/56 08/29/22 0832  Temp    Pulse 97 08/29/22 0835  Resp 29 08/29/22 0835  SpO2 94 % 08/29/22 0835  Vitals shown include unvalidated device data.  Last Pain:  Vitals:   08/29/22 0626  PainSc: 0-No pain      Patients Stated Pain Goal: 0 (23/76/28 3151)  Complications: No notable events documented.

## 2022-08-30 ENCOUNTER — Encounter (HOSPITAL_COMMUNITY): Payer: Self-pay | Admitting: Neurological Surgery

## 2022-08-30 ENCOUNTER — Observation Stay (HOSPITAL_BASED_OUTPATIENT_CLINIC_OR_DEPARTMENT_OTHER): Payer: Medicare Other

## 2022-08-30 ENCOUNTER — Other Ambulatory Visit (HOSPITAL_COMMUNITY): Payer: Self-pay | Admitting: Emergency Medicine

## 2022-08-30 DIAGNOSIS — I509 Heart failure, unspecified: Secondary | ICD-10-CM

## 2022-08-30 DIAGNOSIS — M4316 Spondylolisthesis, lumbar region: Secondary | ICD-10-CM | POA: Diagnosis not present

## 2022-08-30 DIAGNOSIS — I469 Cardiac arrest, cause unspecified: Secondary | ICD-10-CM | POA: Diagnosis not present

## 2022-08-30 DIAGNOSIS — R079 Chest pain, unspecified: Secondary | ICD-10-CM | POA: Diagnosis not present

## 2022-08-30 DIAGNOSIS — I2510 Atherosclerotic heart disease of native coronary artery without angina pectoris: Secondary | ICD-10-CM | POA: Insufficient documentation

## 2022-08-30 LAB — ABO/RH: ABO/RH(D): A POS

## 2022-08-30 MED ORDER — DIPHENHYDRAMINE HCL 25 MG PO CAPS: 50.0000 mg | ORAL_CAPSULE | Freq: Once | ORAL | Status: DC

## 2022-08-30 MED ORDER — DIPHENHYDRAMINE HCL 50 MG/ML IJ SOLN: 50.0000 mg | Freq: Once | INTRAMUSCULAR | Status: AC

## 2022-08-30 MED ORDER — NITROGLYCERIN 0.4 MG SL SUBL
SUBLINGUAL_TABLET | SUBLINGUAL | Status: AC
  Filled 2022-08-30: qty 2

## 2022-08-30 MED ORDER — METHYLPREDNISOLONE SODIUM SUCC 40 MG IJ SOLR: 40.0000 mg | Freq: Once | INTRAMUSCULAR | Status: DC

## 2022-08-30 MED ORDER — METOPROLOL TARTRATE 25 MG PO TABS
25.0000 mg | ORAL_TABLET | Freq: Once | ORAL | Status: AC
  Administered 2022-08-30: 25 mg via ORAL
  Filled 2022-08-30: qty 1

## 2022-08-30 MED ORDER — DIPHENHYDRAMINE HCL 50 MG/ML IJ SOLN: 50.0000 mg | Freq: Once | INTRAMUSCULAR | Status: DC

## 2022-08-30 MED ORDER — IOHEXOL 350 MG/ML SOLN
95.0000 mL | Freq: Once | INTRAVENOUS | Status: AC | PRN
  Administered 2022-08-30: 95 mL via INTRAVENOUS

## 2022-08-30 MED ORDER — DILTIAZEM HCL 25 MG/5ML IV SOLN
INTRAVENOUS | Status: AC
  Administered 2022-08-30: 25 mg
  Filled 2022-08-30: qty 5

## 2022-08-30 MED ORDER — METHYLPREDNISOLONE SODIUM SUCC 40 MG IJ SOLR
40.0000 mg | Freq: Once | INTRAMUSCULAR | Status: AC
  Administered 2022-08-30: 40 mg via INTRAVENOUS
  Filled 2022-08-30: qty 1

## 2022-08-30 MED ORDER — DIPHENHYDRAMINE HCL 25 MG PO CAPS
50.0000 mg | ORAL_CAPSULE | Freq: Once | ORAL | Status: AC
  Administered 2022-08-30: 50 mg via ORAL
  Filled 2022-08-30: qty 2

## 2022-08-30 NOTE — Care Management Obs Status (Signed)
[place] NOTIFICATION   Patient Details  Name: Quinnley Colasurdo MRN: [id number] Date of Birth: 1956/06/21   Medicare Observation Status Notification Given:  Yes    Coralee Pesa, [place] 08/30/2022, 11:19 AM

## 2022-08-30 NOTE — Progress Notes (Signed)
Pt discharged to home. DC instructions given with female family member at bedside. No concerns voiced. Pt left unit in wheelchair pushed by [employer], [place]. Left in stable condition.

## 2022-08-30 NOTE — Progress Notes (Signed)
Rounding Note    Patient Name: Shannon Osborne Date of Encounter: 08/30/2022  [place] Cardiologist: Freada Bergeron, MD  Subjective   No CP or dyspnea  Inpatient Medications    Scheduled Meds:  aspirin EC  81 mg Oral Daily   diphenhydrAMINE  50 mg Oral Once   Or   diphenhydrAMINE  50 mg Intravenous Once   docusate sodium  100 mg Oral BID   ezetimibe  10 mg Oral QHS   icosapent Ethyl  2 g Oral BID   losartan  50 mg Oral Daily   methylPREDNISolone (SOLU-MEDROL) injection  40 mg Intravenous Once   metoprolol succinate  25 mg Oral q morning   metoprolol tartrate  25 mg Oral Once   pantoprazole  40 mg Oral Daily   pregabalin  25 mg Oral BID   sodium chloride flush  3 mL Intravenous Q12H   Continuous Infusions:  sodium chloride 250 mL (08/29/22 1652)   PRN Meds: acetaminophen **OR** acetaminophen, melatonin, menthol-cetylpyridinium **OR** phenol, ondansetron **OR** ondansetron (ZOFRAN) IV, polyethylene glycol, sodium chloride flush, traZODone   Vital Signs    Vitals:   08/29/22 1600 08/29/22 2014 08/30/22 0015 08/30/22 0448  BP: 110/62 104/77 (!) 102/56 100/67  Pulse: 82 83 72 76  Resp: 18     Temp: 97.7 F (36.5 C) 98.4 F (36.9 C) 97.8 F (36.6 C) 97.9 F (36.6 C)  TempSrc: Oral Oral Oral Oral  SpO2: 98% 97% 96% 96%  Weight:      Height:        Intake/Output Summary (Last 24 hours) at 08/30/2022 0727 Last data filed at 08/29/2022 0828 Gross per 24 hour  Intake 650 ml  Output --  Net 650 ml      08/29/2022    5:59 AM 08/22/2022   12:50 PM 06/08/2022    9:12 AM  Last 3 Weights  Weight (lbs) 140 lb 3.2 oz 144 lb 143 lb 9 oz  Weight (kg) 63.594 kg 65.318 kg 65.12 kg      Telemetry    Sinus - Personally Reviewed   Physical Exam   GEN: No acute distress.   Neck: No JVD Cardiac: RRR, no murmurs, rubs, or gallops.  Respiratory: Clear to auscultation bilaterally. GI: Soft, nontender, non-distended  MS: No edema Neuro:  Nonfocal   Psych: Normal affect   Labs    High Sensitivity Troponin:   Recent Labs  Lab 08/29/22 1138 08/29/22 1507  TROPONINIHS 536* [phone]*    Radiology    ECHOCARDIOGRAM COMPLETE  Result Date: 08/29/2022    ECHOCARDIOGRAM REPORT   Patient Name:   Shannon Osborne Date of Exam: 08/29/2022 Medical Rec #:  [id number]   Height:       59.0 in Accession #:    [id number]  Weight:       140.2 lb Date of Birth:  [birth year]    BSA:          1.586 m Patient Age:    67 years    BP:           93/58 mmHg Patient Gender: F           HR:           72 bpm. Exam Location:  Inpatient Procedure: 2D Echo, Cardiac Doppler and Color Doppler Indications:     Asystole  History:         Patient has prior history of Echocardiogram examinations, most  recent 10/27/2021. Risk Factors:Hypertension.  Sonographer:     Clayton Lefort RDCS (AE) Referring Phys:  [id number] [facility] MENG Diagnosing Phys: Eleonore Chiquito MD IMPRESSIONS  1. Left ventricular ejection fraction, by estimation, is 45 to 50%. The left ventricle has mildly decreased function. The left ventricle demonstrates global hypokinesis. Left ventricular diastolic parameters are consistent with Grade I diastolic dysfunction (impaired relaxation).  2. Right ventricular systolic function is normal. The right ventricular size is normal. There is moderately elevated pulmonary artery systolic pressure. The estimated right ventricular systolic pressure is 18.2 mmHg.  3. The mitral valve is grossly normal. Trivial mitral valve regurgitation. No evidence of mitral stenosis.  4. The aortic valve is tricuspid. Aortic valve regurgitation is not visualized. No aortic stenosis is present.  5. The inferior vena cava is dilated in size with >50% respiratory variability, suggesting right atrial pressure of 8 mmHg. FINDINGS  Left Ventricle: Left ventricular ejection fraction, by estimation, is 45 to 50%. The left ventricle has mildly decreased function. The left ventricle demonstrates global hypokinesis.  The left ventricular internal cavity size was normal in size. There is  no left ventricular hypertrophy. Left ventricular diastolic parameters are consistent with Grade I diastolic dysfunction (impaired relaxation). Right Ventricle: The right ventricular size is normal. No increase in right ventricular wall thickness. Right ventricular systolic function is normal. There is moderately elevated pulmonary artery systolic pressure. The tricuspid regurgitant velocity is 3.22 m/s, and with an assumed right atrial pressure of 8 mmHg, the estimated right ventricular systolic pressure is 99.3 mmHg. Left Atrium: Left atrial size was normal in size. Right Atrium: Right atrial size was normal in size. Pericardium: There is no evidence of pericardial effusion. Presence of epicardial fat layer. Mitral Valve: The mitral valve is grossly normal. Trivial mitral valve regurgitation. No evidence of mitral valve stenosis. Tricuspid Valve: The tricuspid valve is grossly normal. Tricuspid valve regurgitation is mild . No evidence of tricuspid stenosis. Aortic Valve: The aortic valve is tricuspid. Aortic valve regurgitation is not visualized. No aortic stenosis is present. Aortic valve mean gradient measures 3.0 mmHg. Aortic valve peak gradient measures 4.5 mmHg. Aortic valve area, by VTI measures 1.82 cm. Pulmonic Valve: The pulmonic valve was grossly normal. Pulmonic valve regurgitation is trivial. No evidence of pulmonic stenosis. Aorta: The aortic root and ascending aorta are structurally normal, with no evidence of dilitation. Venous: The inferior vena cava is dilated in size with greater than 50% respiratory variability, suggesting right atrial pressure of 8 mmHg. IAS/Shunts: The atrial septum is grossly normal.  LEFT VENTRICLE PLAX 2D LVIDd:         3.60 cm   Diastology LV PW:         1.10 cm   LV e' medial:    5.77 cm/s LV IVS:        1.20 cm   LV E/e' medial:  12.6 LVOT diam:     1.80 cm   LV e' lateral:   7.94 cm/s LV SV:          42        LV E/e' lateral: 9.2 LV SV Index:   27 LVOT Area:     2.54 cm  RIGHT VENTRICLE             IVC RV Basal diam:  3.00 cm     IVC diam: 2.30 cm RV S prime:     10.10 cm/s TAPSE (M-mode): 1.6 cm LEFT ATRIUM  Index        RIGHT ATRIUM           Index LA diam:        3.00 cm 1.89 cm/m   RA Area:     12.70 cm LA Vol (A2C):   30.7 ml 19.36 ml/m  RA Volume:   34.50 ml  21.75 ml/m LA Vol (A4C):   21.4 ml 13.49 ml/m LA Biplane Vol: 26.1 ml 16.46 ml/m  AORTIC VALVE AV Area (Vmax):    1.83 cm AV Area (Vmean):   1.74 cm AV Area (VTI):     1.82 cm AV Vmax:           106.00 cm/s AV Vmean:          74.800 cm/s AV VTI:            0.233 m AV Peak Grad:      4.5 mmHg AV Mean Grad:      3.0 mmHg LVOT Vmax:         76.10 cm/s LVOT Vmean:        51.100 cm/s LVOT VTI:          0.167 m LVOT/AV VTI ratio: 0.72  AORTA Ao Root diam: 2.90 cm Ao Asc diam:  3.30 cm MITRAL VALVE               TRICUSPID VALVE MV Area (PHT): 2.66 cm    TR Peak grad:   41.5 mmHg MV Decel Time: 285 msec    TR Vmax:        322.00 cm/s MV E velocity: 72.80 cm/s MV A velocity: 82.30 cm/s  SHUNTS MV E/A ratio:  0.88        Systemic VTI:  0.17 m                            Systemic Diam: 1.80 cm Eleonore Chiquito MD Electronically signed by Eleonore Chiquito MD Signature Date/Time: 08/29/2022/5:41:27 PM    Final (Updated)      Patient Profile     67 year old female with past medical history of hypertension, previous breast cancer status postchemotherapy, hyperlipidemia, mildly elevated calcium score admitted with episode of asystole. Patient presented for back surgery.  Prior to surgery she was given Versed and propofol.  She subsequently by report developed asystolic episode for approximately 45 seconds (no strips recorded).  She then was noted to be bradycardic with heart rate in the 20s.  She was given epinephrine and intubated.  Her heart rate improved and she was subsequently extubated.  Cardiology asked to evaluate.  Echocardiogram showed  ejection fraction 45 to 50% with mild global hypokinesis, grade 1 diastolic dysfunction, moderate pulmonary hypertension.  Assessment & Plan    1 episode of asystole-by report patient had an episode of asystole following administration of propofol and Versed (no strips recorded). She then developed sinus bradycardia at a heart rate of 20.  She was given epinephrine with resolution of bradycardia.  Patient had no symptoms prior to the event or after; she has no history of dyspnea, chest pain, palpitations or syncope.  Telemetry reviewed and shows no significant arrhythmias.  Echocardiogram shows mild global reduction in LV function.  Troponin is minimally elevated.  I still feel episode is likely vagal in etiology.  However given mild cardiomyopathy and minimal elevation in troponin we will plan cardiac CTA to rule out obstructive coronary disease.  If no significant obstruction noted patient  can be discharged and will follow-up as an outpatient.   2 hypertension-blood pressure is borderline.  Will hold losartan but continue low-dose Toprol.  Follow and adjust as needed.   3 history of coronary calcification-continue aspirin, Zetia and Repatha (intolerant to statins).  4 history of dye allergy-patient developed rash with dye in the past.  Will premedicate with Benadryl and Solu-Medrol.  For questions or updates, please contact [place] Please consult [email or web address] for contact info under        Signed, Kirk Ruths, MD  08/30/2022, 7:27 AM

## 2022-08-30 NOTE — Discharge Summary (Signed)
Discharge Summary    Patient ID: Shannon Osborne MRN: [id number]; DOB: 06/06/1956  Admit date: 08/29/2022 Discharge date: 08/30/2022  PCP:  Leamon Arnt, MD   [place] Providers Cardiologist:  Freada Bergeron, MD   Discharge Diagnoses    Principal Problem:   Spondylolisthesis of lumbar region Active Problems:   Essential hypertension   Asystole ([place])   Nonobstructive atherosclerosis of coronary artery    Diagnostic Studies/Procedures    Echo 08/29/22:  1. Left ventricular ejection fraction, by estimation, is 45 to 50%. The  left ventricle has mildly decreased function. The left ventricle  demonstrates global hypokinesis. Left ventricular diastolic parameters are  consistent with Grade I diastolic  dysfunction (impaired relaxation).   2. Right ventricular systolic function is normal. The right ventricular  size is normal. There is moderately elevated pulmonary artery systolic  pressure. The estimated right ventricular systolic pressure is 64.1 mmHg.   3. The mitral valve is grossly normal. Trivial mitral valve  regurgitation. No evidence of mitral stenosis.   4. The aortic valve is tricuspid. Aortic valve regurgitation is not  visualized. No aortic stenosis is present.   5. The inferior vena cava is dilated in size with >50% respiratory  variability, suggesting right atrial pressure of 8 mmHg.  _____________  CT coronary 08/30/22: MPRESSION: 1. Coronary calcium score of 520. This was 94th percentile for age-, sex, and race-matched controls.   2. Normal coronary origin with left dominance.   3. Mild calcified plaque (25-49%) in the LAD.   4. Minimal calcified plaque (<25%) in the LCX.   5. Small PFO.   RECOMMENDATIONS: 1. Mild non-obstructive CAD (25-49%). Consider non-atherosclerotic causes of chest pain. Consider preventive therapy and risk factor modification.  IMPRESSION: No significant extracardiac findings within the visualized chest.    History of Present Illness     Shannon Osborne is a 67 y.o. female with hyperlipidemia intolerant of statins, hypertension, history of breast cancer s/p chemo  who is being seen 08/29/2022 for the evaluation of Asystole.   [name] is a pleasant 67 year old female with past medical history of hyperlipidemia intolerant of statins, hypertension, history of breast cancer s/p chemo.  Previous coronary calcium scoring test in February 2016 showed coronary calcium score of 253 which placed the patient at 90th percentile for age and sex matched control.  She has trialed simvastatin, Crestor, Lipitor and Livalo and could not tolerate any of them.  Patient was referred to Dr. Johney Frame in February 2023 when her shoulder CT scan revealed atherosclerosis of the aorta, left subclavian artery and coronary arteries.  She denied any exertional chest pain at the time.  Subsequent echocardiogram obtained on 10/27/2021 showed EF 60 to 65%, RVSP 24.7 mmHg, mild MR, normal RV, grade 1 DD.   Patient was scheduled to undergo back surgery by Dr. Zada Finders.  She denied any recent chest pain or shortness of breath.  She does have a chronic right ear ringing sensation which causes some degree of imbalance (propensity to lean to the right side) but no significant dizziness.  She denies any recent blurry vision or feeling of passing out.  She does have a sinus issue recently.  She presented for her back surgery on 08/29/2021.  She was initially given a dose of Versed followed by 2 doses of propofol.  After the second dose of propofol, anesthesiologist subsequently noted the patient was asystolic on telemetry.  Leads were checked to make sure they were on. Prior to asystole, there was  no reported bradycardia or tachycardia. BP prior to asystole was normal. Unfortunately no strip was obtained.  Prior to CPR can be performed, after about 45 seconds from the onset of event, patient came out of asystole and was in bradycardia with heart rate in the  20s.  She was given a single dose of 100 amps of epinephrine and was intubated.  Blood pressure and heart rate later normalized and the patient was subsequently extubated.  Cardiology service consulted for asystole.  Again, no strips is available to review.  Preop EKG obtained on 08/22/2022 showed normal sinus rhythm, no significant ST wave changes, peaked T wave.  Basic metabolic panel obtained on the same day showed normal renal function and electrolyte, potassium 4.1.  Recent hemoglobin obtained on the same day was 11.8.  Note, patient previously underwent foot surgery at [facility] and also colonoscopy and used propofol during both surgeries without issue.    Hospital Course     Consultants: none  Asystole Mild Non-obstructive CAD  Per report: pt had > 20 sec of asystole following a second dose of propofol without a drop in BP. Prior to CPR, pt returned to sinus bradycardia in the 20s and received 100 amps of epinephrine. Pt was briefly intubated. She is generally active and denies cardiac symptoms prior to presentation for surgery. She was admitted overnight for observation. Suspect that episode was vagal in etiology, but given mild cardiomyopathy and mild trop elevation, patient underwent coronary CT that showed mild calcified plaque (25-49%) in the LAD and minimal calcified plaque (<25%) in the LCX.  Recommended preventative therapy and risk factor modification for mild non-obstructive CAD. Continue daily ASA 81 mg, metoprolol succinate 25 mg daily, zetia 10 mg daily, repatha (patient is intolerant of statins) Patient was chest pain free and reported feeling well prior to DC  Asked patient to wait until she is seen as an outpatient prior to rescheduling surgery.   Elevated troponin  HS troponin 536 --> 873 She did not receive chest compressions She continues to deny chest pain Given troponin elevation and mild cardiomyopathy, as below, we opted to obtain coronary CTA prior to DC. Results as above    Mild Cardiomyopathy Echo shows LVEF 45-50% with global hypokinesis and grade 1 DD, normal RV, moderately elevated PASP with RV systolic pressure 89.3 mmHg. She is not volume up. Coronary CTA as above.  Continue metoprolol succinate 25 mg daily. BP soft, so unable to add additional GDMT at this time.   Hypertension BP has been borderline low. Will hold losartan at discharge, continue toprol   Cardiology follow up has been arranged. Appointment on 1/17 with Melina Copa PA-C     Did the patient have an acute coronary syndrome (MI, NSTEMI, STEMI, etc) this admission?:  No                               Did the patient have a percutaneous coronary intervention (stent / angioplasty)?:  No.          _____________  Discharge Vitals Blood pressure 115/63, pulse 79, temperature (!) 97.3 F (36.3 C), resp. rate 16, height '4\' 11"'$  (1.499 m), weight 63.6 kg, SpO2 97 %.  Filed Weights   08/29/22 0559  Weight: 63.6 kg    Labs & Radiologic Studies    CBC No results for input(s): "WBC", "NEUTROABS", "HGB", "HCT", "MCV", "PLT" in the last 72 hours. Basic Metabolic Panel No results for input(s): "  NA", "K", "CL", "CO2", "GLUCOSE", "BUN", "CREATININE", "CALCIUM", "MG", "PHOS" in the last 72 hours. Liver Function Tests No results for input(s): "AST", "ALT", "ALKPHOS", "BILITOT", "PROT", "ALBUMIN" in the last 72 hours. No results for input(s): "LIPASE", "AMYLASE" in the last 72 hours. High Sensitivity Troponin:   Recent Labs  Lab 08/29/22 1138 08/29/22 1507  TROPONINIHS 536* 873*    BNP Invalid input(s): "POCBNP" D-Dimer No results for input(s): "DDIMER" in the last 72 hours. Hemoglobin A1C No results for input(s): "HGBA1C" in the last 72 hours. Fasting Lipid Panel No results for input(s): "CHOL", "HDL", "LDLCALC", "TRIG", "CHOLHDL", "LDLDIRECT" in the last 72 hours. Thyroid Function Tests No results for input(s): "TSH", "T4TOTAL", "T3FREE", "THYROIDAB" in the last 72  hours.  Invalid input(s): "FREET3" _____________  CT CORONARY MORPH W/CTA COR W/SCORE W/CA W/CM &/OR WO/CM  Addendum Date: 08/30/2022   ADDENDUM REPORT: 08/30/2022 17:12 EXAM: OVER-READ INTERPRETATION  CT CHEST The following report is an over-read performed by radiologist Dr. Yvonne Kendall of [facility], [place] on 08/30/2022. This over-read does not include interpretation of cardiac or coronary anatomy or pathology. The coronary calcium score/coronary CTA interpretation by the cardiologist is attached. COMPARISON:  Chest two views 06/26/2021 FINDINGS: Cardiovascular: There are no significant extracardiac vascular findings. Mediastinum/Nodes: A surgical clip is seen within the lateral left chest wall (axial series 10, image 39, coronal series 602 image 35). No lymphadenopathy is identified. Lungs/Pleura: There is no pleural effusion. Mild curvilinear subsegmental atelectasis versus scarring within the anterior left lower lobe. Upper abdomen: No significant findings in the visualized upper abdomen. Musculoskeletal/Chest wall: Mild dextrocurvature of the mid to lower thoracic spine. Moderate to severe multilevel disc space narrowing and mild anterior endplate osteophytes. IMPRESSION: No significant extracardiac findings within the visualized chest. Electronically Signed   By: Yvonne Kendall M.D.   On: 08/30/2022 17:12   Result Date: 08/30/2022 CLINICAL DATA:  Chest pain EXAM: Cardiac/Coronary CTA TECHNIQUE: A non-contrast, gated CT scan was obtained with axial slices of 3 mm through the heart for calcium scoring. Calcium scoring was performed using the Agatston method. A 120 kV prospective, gated, contrast cardiac scan was obtained. Gantry rotation speed was 250 msecs and collimation was 0.6 mm. Two sublingual nitroglycerin tablets (0.8 mg) were given. The 3D data set was reconstructed in 5% intervals of the 35-75% of the R-R cycle. Diastolic phases were analyzed on a dedicated workstation using MPR, MIP,  and VRT modes. The patient received 95 cc of contrast. FINDINGS: Image quality: Excellent. Noise artifact is: Limited. Coronary Arteries:  Normal coronary origin.  Left dominance. Left main: The left main is a large caliber vessel with a normal take off from the left coronary cusp that bifurcates to form a left anterior descending artery and a left circumflex artery. There is no plaque or stenosis. Left anterior descending artery: The proximal and mid LAD segments contains mild calcified plaque (25-49%). The distal LAD is patent. The LAD gives off 2 patent diagonal branches. Left circumflex artery: The LCX is non-dominant. There is minimal calcified plaque (<25%) in the proximal and mid segments. The distal LCX is patent. The LCX gives off 3 patent obtuse marginal branches. The LCX terminates as a patent PDA. Right coronary artery: The RCA is non-dominant with normal take off from the right coronary cusp. There is no evidence of plaque or stenosis. Right Atrium: Right atrial size is within normal limits. Right Ventricle: The right ventricular cavity is within normal limits. Left Atrium: Left atrial size is normal in size  with no left atrial appendage filling defect. Small PFO. Left Ventricle: The ventricular cavity size is within normal limits. Small vasal inferoseptal diverticulum. Pulmonary arteries: Normal in size without proximal filling defect. Pulmonary veins: Normal pulmonary venous drainage. Pericardium: Normal thickness without significant effusion or calcium present. Cardiac valves: The aortic valve is trileaflet without significant calcification. The mitral valve is normal without significant calcification. Aorta: Normal caliber without significant disease. Extra-cardiac findings: See attached radiology report for non-cardiac structures. IMPRESSION: 1. Coronary calcium score of 520. This was 94th percentile for age-, sex, and race-matched controls. 2. Normal coronary origin with left dominance. 3. Mild  calcified plaque (25-49%) in the LAD. 4. Minimal calcified plaque (<25%) in the LCX. 5. Small PFO. RECOMMENDATIONS: 1. Mild non-obstructive CAD (25-49%). Consider non-atherosclerotic causes of chest pain. Consider preventive therapy and risk factor modification. Eleonore Chiquito, MD Electronically Signed: By: Eleonore Chiquito M.D. On: 08/30/2022 16:45   ECHOCARDIOGRAM COMPLETE  Result Date: 08/29/2022    ECHOCARDIOGRAM REPORT   Patient Name:   KIYANI JERNIGAN Date of Exam: 08/29/2022 Medical Rec #:  [id number]   Height:       59.0 in Accession #:    [id number]  Weight:       140.2 lb Date of Birth:  Jul 28, 1956    BSA:          1.586 m Patient Age:    3 years    BP:           93/58 mmHg Patient Gender: F           HR:           72 bpm. Exam Location:  Inpatient Procedure: 2D Echo, Cardiac Doppler and Color Doppler Indications:     Asystole  History:         Patient has prior history of Echocardiogram examinations, most                  recent 10/27/2021. Risk Factors:Hypertension.  Sonographer:     Clayton Lefort RDCS (AE) Referring Phys:  [id number] [facility] MENG Diagnosing Phys: Eleonore Chiquito MD IMPRESSIONS  1. Left ventricular ejection fraction, by estimation, is 45 to 50%. The left ventricle has mildly decreased function. The left ventricle demonstrates global hypokinesis. Left ventricular diastolic parameters are consistent with Grade I diastolic dysfunction (impaired relaxation).  2. Right ventricular systolic function is normal. The right ventricular size is normal. There is moderately elevated pulmonary artery systolic pressure. The estimated right ventricular systolic pressure is 56.3 mmHg.  3. The mitral valve is grossly normal. Trivial mitral valve regurgitation. No evidence of mitral stenosis.  4. The aortic valve is tricuspid. Aortic valve regurgitation is not visualized. No aortic stenosis is present.  5. The inferior vena cava is dilated in size with >50% respiratory variability, suggesting right atrial pressure of 8 mmHg.  FINDINGS  Left Ventricle: Left ventricular ejection fraction, by estimation, is 45 to 50%. The left ventricle has mildly decreased function. The left ventricle demonstrates global hypokinesis. The left ventricular internal cavity size was normal in size. There is  no left ventricular hypertrophy. Left ventricular diastolic parameters are consistent with Grade I diastolic dysfunction (impaired relaxation). Right Ventricle: The right ventricular size is normal. No increase in right ventricular wall thickness. Right ventricular systolic function is normal. There is moderately elevated pulmonary artery systolic pressure. The tricuspid regurgitant velocity is 3.22 m/s, and with an assumed right atrial pressure of 8 mmHg, the estimated right ventricular systolic pressure is 14.9 mmHg. Left  Atrium: Left atrial size was normal in size. Right Atrium: Right atrial size was normal in size. Pericardium: There is no evidence of pericardial effusion. Presence of epicardial fat layer. Mitral Valve: The mitral valve is grossly normal. Trivial mitral valve regurgitation. No evidence of mitral valve stenosis. Tricuspid Valve: The tricuspid valve is grossly normal. Tricuspid valve regurgitation is mild . No evidence of tricuspid stenosis. Aortic Valve: The aortic valve is tricuspid. Aortic valve regurgitation is not visualized. No aortic stenosis is present. Aortic valve mean gradient measures 3.0 mmHg. Aortic valve peak gradient measures 4.5 mmHg. Aortic valve area, by VTI measures 1.82 cm. Pulmonic Valve: The pulmonic valve was grossly normal. Pulmonic valve regurgitation is trivial. No evidence of pulmonic stenosis. Aorta: The aortic root and ascending aorta are structurally normal, with no evidence of dilitation. Venous: The inferior vena cava is dilated in size with greater than 50% respiratory variability, suggesting right atrial pressure of 8 mmHg. IAS/Shunts: The atrial septum is grossly normal.  LEFT VENTRICLE PLAX 2D LVIDd:          3.60 cm   Diastology LV PW:         1.10 cm   LV e' medial:    5.77 cm/s LV IVS:        1.20 cm   LV E/e' medial:  12.6 LVOT diam:     1.80 cm   LV e' lateral:   7.94 cm/s LV SV:         42        LV E/e' lateral: 9.2 LV SV Index:   27 LVOT Area:     2.54 cm  RIGHT VENTRICLE             IVC RV Basal diam:  3.00 cm     IVC diam: 2.30 cm RV S prime:     10.10 cm/s TAPSE (M-mode): 1.6 cm LEFT ATRIUM             Index        RIGHT ATRIUM           Index LA diam:        3.00 cm 1.89 cm/m   RA Area:     12.70 cm LA Vol (A2C):   30.7 ml 19.36 ml/m  RA Volume:   34.50 ml  21.75 ml/m LA Vol (A4C):   21.4 ml 13.49 ml/m LA Biplane Vol: 26.1 ml 16.46 ml/m  AORTIC VALVE AV Area (Vmax):    1.83 cm AV Area (Vmean):   1.74 cm AV Area (VTI):     1.82 cm AV Vmax:           106.00 cm/s AV Vmean:          74.800 cm/s AV VTI:            0.233 m AV Peak Grad:      4.5 mmHg AV Mean Grad:      3.0 mmHg LVOT Vmax:         76.10 cm/s LVOT Vmean:        51.100 cm/s LVOT VTI:          0.167 m LVOT/AV VTI ratio: 0.72  AORTA Ao Root diam: 2.90 cm Ao Asc diam:  3.30 cm MITRAL VALVE               TRICUSPID VALVE MV Area (PHT): 2.66 cm    TR Peak grad:   41.5 mmHg MV Decel Time: 285 msec  TR Vmax:        322.00 cm/s MV E velocity: 72.80 cm/s MV A velocity: 82.30 cm/s  SHUNTS MV E/A ratio:  0.88        Systemic VTI:  0.17 m                            Systemic Diam: 1.80 cm Eleonore Chiquito MD Electronically signed by Eleonore Chiquito MD Signature Date/Time: 08/29/2022/5:41:27 PM    Final (Updated)    Disposition   Pt is being discharged home today in good condition.  Follow-up Plans & Appointments     Follow-up Information     Charlie Pitter, PA-C Follow up on 09/06/2022.   Specialties: Cardiology, Radiology Why: Appointment at 2:20 PM Contact information: [address] [place] [address] [phone]                Discharge Instructions     Call MD for:  difficulty breathing, headache  or visual disturbances   Complete by: As directed    Call MD for:  persistant dizziness or light-headedness   Complete by: As directed    Call MD for:  persistant nausea and vomiting   Complete by: As directed    Call MD for:  severe uncontrolled pain   Complete by: As directed    Diet - low sodium heart healthy   Complete by: As directed    Increase activity slowly   Complete by: As directed         Discharge Medications   Allergies as of 08/30/2022       Reactions   Baclofen Other (See Comments)   insomnia   Cyclobenzaprine Other (See Comments)   Insomnia   Escitalopram Oxalate Other (See Comments)   Iodine Other (See Comments)   Rosuvastatin Other (See Comments)   Sertraline Other (See Comments)   "like taking caffeine"   Simvastatin Other (See Comments)   Atorvastatin Other (See Comments)   myalgia   Codeine Rash   Contrast Media [iodinated Contrast Media] Rash   Chest pressure    Hydrochlorothiazide W-triamterene Rash   Hydrocodone-acetaminophen Rash   Oxycodone Hcl Rash        Medication List     STOP taking these medications    losartan 50 MG tablet Commonly known as: COZAAR   phenazopyridine 200 MG tablet Commonly known as: Pyridium       TAKE these medications    acidophilus Caps capsule Take 1 capsule by mouth daily.   aspirin EC 81 MG tablet Take 81 mg by mouth daily. Swallow whole.   azelastine 0.1 % nasal spray Commonly known as: ASTELIN Place 1-2 sprays into both nostrils daily as needed for rhinitis or allergies.   carisoprodol 350 MG tablet Commonly known as: SOMA Take 1 tablet (350 mg total) by mouth 3 (three) times daily as needed for muscle spasms. What changed: how much to take   ezetimibe 10 MG tablet Commonly known as: ZETIA TAKE 1 TABLET AT BEDTIME   Flonase Sensimist 27.5 MCG/SPRAY nasal spray Generic drug: fluticasone Place 2 sprays into the nose daily as needed for rhinitis or allergies.   icosapent Ethyl 1 g  capsule Commonly known as: Vascepa Take 2 capsules (2 g total) by mouth 2 (two) times daily.   melatonin 3 MG Tabs tablet Take 3 mg by mouth at bedtime as needed (sleep).   metoprolol succinate 25 MG 24 hr  tablet Commonly known as: TOPROL-XL Take 25 mg by mouth every morning.   multivitamin with minerals Tabs tablet Take 1 tablet by mouth daily.   omeprazole 20 MG capsule Commonly known as: PRILOSEC TAKE 1 CAPSULE(20 MG) BY MOUTH DAILY What changed: See the new instructions.   pregabalin 25 MG capsule Commonly known as: LYRICA Take 25 mg by mouth 2 (two) times daily.   Repatha SureClick 326 MG/ML Soaj Generic drug: Evolocumab Inject 140 mg into the skin every 14 (fourteen) days.   traZODone 50 MG tablet Commonly known as: DESYREL Take 50 mg by mouth at bedtime as needed for sleep.   Ubiquinol 100 MG Caps Take 100 mg by mouth daily.   Vitamin D 50 MCG (2000 UT) Caps Take 2,000 Units by mouth daily.           Outstanding Labs/Studies     Duration of Discharge Encounter   Greater than 30 minutes including physician time.  Signed, Margie Billet, PA-C 08/30/2022, 5:36 PM

## 2022-08-30 NOTE — Progress Notes (Signed)
Neurosurgery Service Progress Note  Subjective: No acute events overnight, overall feeling quite good today aside from her back   Objective: Vitals:   08/29/22 2014 08/30/22 0015 08/30/22 0448 08/30/22 0729  BP: 104/77 (!) 102/56 100/67 113/65  Pulse: 83 72 76 81  Resp:    19  Temp: 98.4 F (36.9 C) 97.8 F (36.6 C) 97.9 F (36.6 C) 97.6 F (36.4 C)  TempSrc: Oral Oral Oral Oral  SpO2: 97% 96% 96% 99%  Weight:      Height:        Physical Exam: Strength 5/5 x4 and SILTx4   Assessment & Plan: 67 y.o. woman s/p aborted procedure 2/2 asystole.  -discussed with her, she'll call as an outpatient and we can review the results of the cards workup, so far workup is reassuring  Shannon Osborne  08/30/22 9:05 AM

## 2022-09-02 ENCOUNTER — Other Ambulatory Visit: Payer: Self-pay | Admitting: Family Medicine

## 2022-09-04 ENCOUNTER — Other Ambulatory Visit: Payer: Self-pay | Admitting: Pharmacist

## 2022-09-04 ENCOUNTER — Ambulatory Visit: Payer: Medicare Other | Admitting: Family Medicine

## 2022-09-04 MED ORDER — REPATHA SURECLICK 140 MG/ML ~~LOC~~ SOAJ: 140.0000 mg | SUBCUTANEOUS | 11 refills | Status: DC

## 2022-09-06 ENCOUNTER — Encounter: Payer: Self-pay | Admitting: Physician Assistant

## 2022-09-06 ENCOUNTER — Ambulatory Visit: Payer: Medicare Other | Attending: Physician Assistant | Admitting: Cardiology

## 2022-09-06 VITALS — BP 134/82 | HR 80 | Ht 59.0 in | Wt 142.2 lb

## 2022-09-06 DIAGNOSIS — I5020 Unspecified systolic (congestive) heart failure: Secondary | ICD-10-CM

## 2022-09-06 DIAGNOSIS — I10 Essential (primary) hypertension: Secondary | ICD-10-CM | POA: Diagnosis present

## 2022-09-06 DIAGNOSIS — E782 Mixed hyperlipidemia: Secondary | ICD-10-CM | POA: Diagnosis not present

## 2022-09-06 DIAGNOSIS — I469 Cardiac arrest, cause unspecified: Secondary | ICD-10-CM

## 2022-09-06 DIAGNOSIS — Z01810 Encounter for preprocedural cardiovascular examination: Secondary | ICD-10-CM

## 2022-09-06 DIAGNOSIS — I2510 Atherosclerotic heart disease of native coronary artery without angina pectoris: Secondary | ICD-10-CM

## 2022-09-06 MED ORDER — LOSARTAN POTASSIUM 25 MG PO TABS: 25.0000 mg | ORAL_TABLET | Freq: Every day | ORAL | 1 refills | Status: DC

## 2022-09-06 NOTE — Progress Notes (Signed)
Cardiology Office Note:    Date:  09/06/2022   ID:  Herbert Seta, DOB 1956-02-08, MRN [id number]  PCP:  Leamon Arnt, [place] Providers Cardiologist:  Freada Bergeron, MD     Referring MD: Leamon Arnt, MD   Chief Complaint  Patient presents with   Follow-up    History of Present Illness:    Shannon Osborne is a 67 y.o. female with a hx of hyperlipidemia/hypertriglyceridemia (intolerant of statins on Repatha, Vascepa, and Zetia), hypertension, breast cancer status post chemo, recent asystolic arrest with mild LV dysfunction, nonobstructive CAD, PFO noted on echo.  Scheduled to undergo back surgery, was given Versed and Propofol by anesthesia and subsequently went into asystole, no arrhythmias noted prior to asystole. After ~45 seconds patient returned to sinus brady rate 20s without initiation of CPR. She was given 1 amp of epi and was intubated, eventually BP and HR normalized and she was extubated. Cardiology was consulted. Echo obtained (08/29/22), showed decrease in her EF 45-50%, LV demonstrated global hypokinesis, LVDP consistent with grade I DD, moderately elevated PA pressures (48mHg), trivial MR. Coronary CTA (08/30/22) revealed Mild non-obstructive CAD (25-49%). Consider non-atherosclerotic causes of chest pain, calcium score 520, 94th percentile for age-sex matched. Mild calcified plaque (25-49% in LAD), minimal (<25% in LCX). Losartan was stopped due to marginal blood pressures.   She presents today accompanied by her friend.  Since the above incident, she has not noted any chest pain, shortness of breath, palpitations, dizziness, weight gain, pedal edema, orthopnea, PND.  She has had some intermittent, longstanding chest pain that is associated with dysfunction of one of her ribs, so it is difficult for her to state that she does not have chest pain, however her chest pain does not sound to be of cardiac in nature. This is unchanged from prior. She has started  walking most days and can walk ~ 1 mile at a time. She is actively trying to make lifestyle modifications to eat healthier.  She has been checking her blood pressure at home, they have been mostly in the 130/70-80 range.  We discussed restarting her losartan at prior '50mg'$  dose and at this time she would prefer to start at 25 mg daily, complete a blood pressure log for 2 weeks and then see if we need to increase the dose again.  She did have some questions regarding recent coronary CTA, specifically regarding Left Ventricle: The ventricular cavity size is within normal limits. Small vasal inferoseptal diverticulum, will reach out to see if Dr. CStanford Breedcan review and offer any insight on the clinical significance of this.   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   [place]   BRACA 1 Pos    Chronic midline low back pain without sciatica 02/18/2020   Family history of adverse reaction to anesthesia    mother is slow to wake up   GERD (gastroesophageal reflux disease) 02/18/2020   Hyperlipemia    Hypertension    Mitral valve prolapse    Multiple drug allergies 02/18/2020   Myalgia due to statin 02/18/2020   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   MRI 05/2018   Osteopenia after menopause 02/11/2021   DEXA 01/2021 lowest T = -1.7 femur; recheck 2-3 years   Pneumonia    PONV (postoperative nausea and vomiting)    after most recent colonoscopy (2020)    Past Surgical History:  Procedure  Laterality Date   ABDOMINAL HYSTERECTOMY  2002   BREAST SURGERY     right 09/10/2007, left 06/16/2009   FOOT BONE EXCISION     SHOULDER ARTHROTOMY Left 2013   and 06/2015   TONSILLECTOMY  2012    Current Medications: Current Meds  Medication Sig   acidophilus (RISAQUAD) CAPS capsule Take 1 capsule by mouth daily.   aspirin EC 81 MG tablet Take 81 mg by mouth daily. Swallow whole.   azelastine (ASTELIN) 0.1 % nasal spray Place 1-2  sprays into both nostrils daily as needed for rhinitis or allergies.   carisoprodol (SOMA) 350 MG tablet Take 1 tablet (350 mg total) by mouth 3 (three) times daily as needed for muscle spasms. (Patient taking differently: Take 175-350 mg by mouth 3 (three) times daily as needed for muscle spasms.)   Cholecalciferol (VITAMIN D) 50 MCG (2000 UT) CAPS Take 2,000 Units by mouth daily.   Evolocumab (REPATHA SURECLICK) 161 MG/ML SOAJ Inject 140 mg into the skin every 14 (fourteen) days.   ezetimibe (ZETIA) 10 MG tablet TAKE 1 TABLET AT BEDTIME   fluticasone (FLONASE SENSIMIST) 27.5 MCG/SPRAY nasal spray Place 2 sprays into the nose daily as needed for rhinitis or allergies.   icosapent Ethyl (VASCEPA) 1 g capsule Take 2 capsules (2 g total) by mouth 2 (two) times daily.   losartan (COZAAR) 25 MG tablet Take 1 tablet (25 mg total) by mouth daily.   melatonin 3 MG TABS tablet Take 3 mg by mouth at bedtime as needed (sleep).   metoprolol succinate (TOPROL-XL) 25 MG 24 hr tablet TAKE 1 TABLET BY MOUTH EVERY DAY   Multiple Vitamin (MULTIVITAMIN WITH MINERALS) TABS tablet Take 1 tablet by mouth daily.   omeprazole (PRILOSEC) 20 MG capsule TAKE 1 CAPSULE(20 MG) BY MOUTH DAILY (Patient taking differently: Take 20 mg by mouth daily as needed (acid reflux).)   traZODone (DESYREL) 50 MG tablet Take 50 mg by mouth at bedtime as needed for sleep.   Ubiquinol 100 MG CAPS Take 100 mg by mouth daily.     Allergies:   Baclofen, Cyclobenzaprine, Escitalopram oxalate, Iodine, Rosuvastatin, Sertraline, Simvastatin, Atorvastatin, Codeine, Contrast media [iodinated contrast media], Hydrochlorothiazide w-triamterene, Hydrocodone-acetaminophen, and Oxycodone hcl   Social History   Socioeconomic History   Marital status: Single    Spouse name: Not on file   Number of children: Not on file   Years of education: Not on file   Highest education level: Not on file  Occupational History   Not on file  Tobacco Use    Smoking status: Never    Passive exposure: Past   Smokeless tobacco: Never  Vaping Use   Vaping Use: Never used  Substance and Sexual Activity   Alcohol use: Yes    Comment: 10 drinks a year    Drug use: Never   Sexual activity: Yes    Partners: Male  Other Topics Concern   Not on file  Social History Narrative   Not on file   Social Determinants of Health   Financial Resource Strain: Low Risk  (02/18/2021)   Overall Financial Resource Strain (CARDIA)    Difficulty of Paying Living Expenses: Not hard at all  Food Insecurity: No Food Insecurity (02/18/2021)   Hunger Vital Sign    Worried About Running Out of Food in the Last Year: Never true    [place] in the Last Year: Never true  Transportation Needs: No Transportation Needs (02/18/2021)   [place] - Transportation  Lack of Transportation (Medical): No    Lack of Transportation (Non-Medical): No  Physical Activity: Sufficiently Active (02/18/2021)   Exercise Vital Sign    Days of Exercise per Week: 6 days    Minutes of Exercise per Session: 60 min  Stress: No Stress Concern Present (02/18/2021)   [place]    Feeling of Stress : Not at all  Social Connections: Socially Isolated (02/18/2021)   Social Connection and Isolation Panel [NHANES]    Frequency of Communication with Friends and Family: More than three times a week    Frequency of Social Gatherings with Friends and Family: More than three times a week    Attends Religious Services: Never    Marine scientist or Organizations: No    Attends Music therapist: Never    Marital Status: Never married     Family History: The patient's family history includes Colon polyps in her mother; Gallstones in her brother, brother, brother, and mother; Hypertension in her brother, brother, brother, and mother; Stroke in her father.  ROS:   Please see the history of present illness.     All other  systems reviewed and are negative.  EKGs/Labs/Other Studies Reviewed:    The following studies were reviewed today:  As above in note.   EKG:  EKG is  ordered today.  The ekg ordered today demonstrates SR, possible LA enlargement, HR 80 bpm, consistent with previous tracing.   Recent Labs: 03/02/2022: ALT 17; TSH 2.12 08/22/2022: BUN 13; Creatinine, Ser 0.79; Hemoglobin 11.8; Platelets 268; Potassium 4.1; Sodium 136  Recent Lipid Panel    Component Value Date/Time   CHOL 166 02/07/2022 0814   TRIG 285 (H) 02/07/2022 0814   HDL 47 02/07/2022 0814   CHOLHDL 3.5 02/07/2022 0814   CHOLHDL 3 11/29/2020 0923   VLDL 32.0 11/29/2020 0923   LDLCALC 73 02/07/2022 0814   LDLDIRECT 238 (H) 10/27/2021 1102   LDLDIRECT 85.0 02/18/2020 1354     Risk Assessment/Calculations:                Physical Exam:    VS:  BP 134/82   Pulse 80   Ht '4\' 11"'$  (1.499 m)   Wt 142 lb 3.2 oz (64.5 kg)   SpO2 97%   BMI 28.72 kg/m     Wt Readings from Last 3 Encounters:  09/06/22 142 lb 3.2 oz (64.5 kg)  08/29/22 140 lb 3.2 oz (63.6 kg)  08/22/22 144 lb (65.3 kg)     GEN:  Well nourished, well developed in no acute distress HEENT: Normal NECK: No JVD; No carotid bruits LYMPHATICS: No lymphadenopathy CARDIAC: RRR, no murmurs, rubs, gallops RESPIRATORY:  Clear to auscultation without rales, wheezing or rhonchi  ABDOMEN: Soft, non-tender, non-distended MUSCULOSKELETAL:  No edema; No deformity  SKIN: Warm and dry NEUROLOGIC:  Alert and oriented x 3 PSYCHIATRIC:  Normal affect   ASSESSMENT:    1. Asystole ([place])   2. HFrEF (heart failure with reduced ejection fraction) ([place])   3. Coronary artery disease involving native coronary artery of native heart without angina pectoris   4. Mixed hyperlipidemia   5. Hypertension, unspecified type   6. Preoperative cardiovascular examination    PLAN:    In order of problems listed above:  1. Asystole with recent surgery, now requiring to revisit  preop cardiovascular evaluation - associated the induction of anesthesia. Ischemic workup reassuring, mild LV dysfunction noted. No additional cardiac testing  was felt necessary by hospital team. Discussed with Dr. Stanford Breed regarding preoperative cardiac exam, he advised if exam today was benign and RCRI/Duke activity index was ok, then she could proceed with surgery.  According to the Revised Cardiac Risk Index (RCRI), her Perioperative Risk of Major Cardiac Event is (%): 0.4 Her Functional Capacity in METs is: 6.61 according to the Duke Activity Status Index (DASI). Therefore, patient may proceed with back surgery as request. Recommend meticulous hemodynamic monitoring during surgery. Will forward a copy of this note to Dr. Zada Finders.  2. HFmrEF -her echo following her asystolic event revealed mildly reduced ejection fraction of 45 to 50%.  Currently, NYHA class I, euvolemic.  Drop in her EF was felt to be related to circumstances of asystolic event/possible stress induced cardiomyopathy  Will repeat limitied echo 6 weeks out from event. Resume lower dose losartan as below.    3. CAD with HLD - Coronary CTA revealed  revealed Mild non-obstructive CAD (25-49%), calcium score 520, 94th percentile for age-sex matched. Mild calcified plaque (25-49% in LAD), minimal (<25% in LCX).Stable with no anginal symptoms. GDMT ASA, metoprolol. She is on PCSK9i. Will reach out to pharmD to determine plan for follow-up with lipid clinic.  4. HTN -blood pressure today is 134/82, her losartan was stopped at her recent hospitalization secondary to marginally low blood pressure readings.  Her home BP logs are consistently in the 130/70-80 range.  We discussed restarting her losartan and she would like to restart it at half dose.  Will restart losartan 25 mg daily.  Repeat bmet in 1 to 2 weeks.  She will keep a blood pressure log for 2 weeks and then report her readings.  Disposition - restart losartan 25 mg daily, she will  keep a blood pressure log and report those findings back.  Bmet in 1 to 2 weeks.  Limited echo at 6 week mark to follow up on decreased EF.  Medication Adjustments/Labs and Tests Ordered: Current medicines are reviewed at length with the patient today.  Concerns regarding medicines are outlined above.  Orders Placed This Encounter  Procedures   Basic Metabolic Panel (BMET)   EKG 12-Lead   ECHOCARDIOGRAM LIMITED   Meds ordered this encounter  Medications   losartan (COZAAR) 25 MG tablet    Sig: Take 1 tablet (25 mg total) by mouth daily.    Dispense:  90 tablet    Refill:  1    Patient Instructions  Medication Instructions:  RESTART Losartan '25mg'$  Take 1 tablet once a day  *If you need a refill on your cardiac medications before your next appointment, please call your pharmacy*   Lab Work: 1-2 weeks BMET If you have labs (blood work) drawn today and your tests are completely normal, you will receive your results only by: [place] (if you have MyChart) OR A paper copy in the mail If you have any lab test that is abnormal or we need to change your treatment, we will call you to review the results.   Testing/Procedures: Your physician has requested that you have an LIMITED echocardiogram. Echocardiography is a painless test that uses sound waves to create images of your heart. It provides your doctor with information about the size and shape of your heart and how well your heart's chambers and valves are working. This procedure takes approximately one hour. There are no restrictions for this procedure. Please do NOT wear cologne, perfume, aftershave, or lotions (deodorant is allowed). Please arrive 15 minutes prior to  your appointment time.   Follow-Up: At [facility], you and your health needs are our priority.  As part of our continuing mission to provide you with exceptional heart care, we have created designated Provider Care Teams.  These Care Teams include  your primary Cardiologist (physician) and Advanced Practice Providers (APPs -  Physician Assistants and Nurse Practitioners) who all work together to provide you with the care you need, when you need it.  We recommend signing up for the patient portal called "MyChart".  Sign up information is provided on this After Visit Summary.  MyChart is used to connect with patients for Virtual Visits (Telemedicine).  Patients are able to view lab/test results, encounter notes, upcoming appointments, etc.  Non-urgent messages can be sent to your provider as well.   To learn more about what you can do with MyChart, go to [email or web address].    Your next appointment:   6-8 week(s)  Provider:   Freada Bergeron, MD     Other Instructions CHECK YOUR BLOOD PRESSURE DAILY FOR 2 WEEK THEN CONTACT THE OFFICE  WITH YOUR READINGS   Signed, Trudi Ida, NP  09/06/2022 5:38 PM    [place]

## 2022-09-06 NOTE — Patient Instructions (Addendum)
Medication Instructions:  RESTART Losartan '25mg'$  Take 1 tablet once a day  *If you need a refill on your cardiac medications before your next appointment, please call your pharmacy*   Lab Work: 1-2 weeks BMET If you have labs (blood work) drawn today and your tests are completely normal, you will receive your results only by: [place] (if you have MyChart) OR A paper copy in the mail If you have any lab test that is abnormal or we need to change your treatment, we will call you to review the results.   Testing/Procedures: Your physician has requested that you have an LIMITED echocardiogram. Echocardiography is a painless test that uses sound waves to create images of your heart. It provides your doctor with information about the size and shape of your heart and how well your heart's chambers and valves are working. This procedure takes approximately one hour. There are no restrictions for this procedure. Please do NOT wear cologne, perfume, aftershave, or lotions (deodorant is allowed). Please arrive 15 minutes prior to your appointment time.   Follow-Up: At [facility], you and your health needs are our priority.  As part of our continuing mission to provide you with exceptional heart care, we have created designated Provider Care Teams.  These Care Teams include your primary Cardiologist (physician) and Advanced Practice Providers (APPs -  Physician Assistants and Nurse Practitioners) who all work together to provide you with the care you need, when you need it.  We recommend signing up for the patient portal called "MyChart".  Sign up information is provided on this After Visit Summary.  MyChart is used to connect with patients for Virtual Visits (Telemedicine).  Patients are able to view lab/test results, encounter notes, upcoming appointments, etc.  Non-urgent messages can be sent to your provider as well.   To learn more about what you can do with MyChart, go to  [email or web address].    Your next appointment:   6-8 week(s)  Provider:   Freada Bergeron, MD     Other Instructions CHECK YOUR BLOOD PRESSURE DAILY FOR 2 WEEK THEN CONTACT THE OFFICE  WITH YOUR READINGS

## 2022-09-11 ENCOUNTER — Encounter: Payer: Self-pay | Admitting: Family Medicine

## 2022-09-11 ENCOUNTER — Ambulatory Visit (INDEPENDENT_AMBULATORY_CARE_PROVIDER_SITE_OTHER): Payer: Medicare Other | Admitting: Family Medicine

## 2022-09-11 VITALS — BP 128/70 | HR 78 | Temp 98.0°F | Ht 59.0 in | Wt 142.8 lb

## 2022-09-11 DIAGNOSIS — I700 Atherosclerosis of aorta: Secondary | ICD-10-CM

## 2022-09-11 DIAGNOSIS — I10 Essential (primary) hypertension: Secondary | ICD-10-CM | POA: Diagnosis not present

## 2022-09-11 DIAGNOSIS — I469 Cardiac arrest, cause unspecified: Secondary | ICD-10-CM

## 2022-09-11 DIAGNOSIS — M545 Low back pain, unspecified: Secondary | ICD-10-CM | POA: Diagnosis not present

## 2022-09-11 DIAGNOSIS — E782 Mixed hyperlipidemia: Secondary | ICD-10-CM

## 2022-09-11 DIAGNOSIS — G8929 Other chronic pain: Secondary | ICD-10-CM

## 2022-09-11 DIAGNOSIS — I2510 Atherosclerotic heart disease of native coronary artery without angina pectoris: Secondary | ICD-10-CM

## 2022-09-11 MED ORDER — OMEPRAZOLE 20 MG PO CPDR: 20.0000 mg | DELAYED_RELEASE_CAPSULE | Freq: Every day | ORAL | Status: DC | PRN

## 2022-09-11 NOTE — Patient Instructions (Signed)
Please return in 6 months for your annual complete physical; please come fasting.   Good luck with your surgery! I know it will all go well. You are a tough cookie :)  If you have any questions or concerns, please don't hesitate to send me a message via MyChart or call the office at [phone]. Thank you for visiting with [place] today! It's our pleasure caring for you.

## 2022-09-11 NOTE — Progress Notes (Signed)
Subjective  CC:  Chief Complaint  Patient presents with   Hypertension    HPI: Shannon Osborne is a 67 y.o. female who presents to the office today to address the problems listed above in the chief complaint. Hypertension f/u: Control is good . Pt reports she is doing well. taking medications as instructed, no medication side effects noted, no TIAs, no chest pain on exertion, no dyspnea on exertion, no swelling of ankles. Restarted losartan 25 along with the metoprolol and bp is better. She denies adverse effects from his BP medications. Compliance with medication is good.  Asystole: suspected vasovagal: reviewed all surgical and f/u cardiology notes. No further ischemia workup needed. Echo shows mild systolic dysfunction EF 76-28 and will be repeated in 6 weeks. No cp. 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   Assessment  1. Essential hypertension   2. Atherosclerosis of aorta ([place])   3. Chronic midline low back pain without sciatica   4. Asystole ([place])   5. Mixed hyperlipidemia   6. Nonobstructive atherosclerosis of coronary artery      Plan   Hypertension f/u: BP control is well controlled. Continue metoprolol and losartan. Has bmp scheduled in 2 weeks to ensure lytes and renal remain stable.  Lumbar surgery to be rescheduled.  CAD and HLD: on repatha and vescepa and has f/u with cards and lipid clinic.  Asystole: preoperatively; possibly vasovagal.   Education regarding management of these chronic disease states was given. Management strategies discussed on successive visits include dietary and exercise recommendations, goals of achieving and maintaining IBW, and lifestyle modifications aiming for adequate sleep and minimizing stressors.   Follow up: 6 mo for cpe  No orders of the defined types were placed in this encounter.  Meds ordered this encounter  Medications   omeprazole (PRILOSEC) 20 MG capsule    Sig: Take 1 capsule (20 mg total) by mouth  daily as needed (acid reflux).      BP Readings from Last 3 Encounters:  09/11/22 128/70  09/06/22 134/82  08/30/22 115/63   Wt Readings from Last 3 Encounters:  09/11/22 142 lb 12.8 oz (64.8 kg)  09/06/22 142 lb 3.2 oz (64.5 kg)  08/29/22 140 lb 3.2 oz (63.6 kg)    Lab Results  Component Value Date   CHOL 166 02/07/2022   CHOL 151 12/09/2021   CHOL 342 (H) 10/27/2021   Lab Results  Component Value Date   HDL 47 02/07/2022   HDL 53 12/09/2021   HDL 54 10/27/2021   Lab Results  Component Value Date   LDLCALC 73 02/07/2022   LDLCALC 60 12/09/2021   LDLCALC 207 (H) 10/27/2021   Lab Results  Component Value Date   TRIG 285 (H) 02/07/2022   TRIG 241 (H) 12/09/2021   TRIG 391 (H) 10/27/2021   Lab Results  Component Value Date   CHOLHDL 3.5 02/07/2022   CHOLHDL 2.8 12/09/2021   CHOLHDL 6.3 (H) 10/27/2021   Lab Results  Component Value Date   LDLDIRECT 238 (H) 10/27/2021   LDLDIRECT 85.0 02/18/2020   Lab Results  Component Value Date   CREATININE 0.79 08/22/2022   BUN 13 08/22/2022   NA 136 08/22/2022   K 4.1 08/22/2022   CL 103 08/22/2022   CO2 27 08/22/2022    The 10-year ASCVD risk score (Arnett DK, et al., 2019) is: 9.9%   Values used to calculate the score:     Age: 40 years  Sex: Female     Is Non-Hispanic African American: No     Diabetic: No     Tobacco smoker: No     Systolic Blood Pressure: 147 mmHg     Is BP treated: Yes     HDL Cholesterol: 47 mg/dL     Total Cholesterol: 166 mg/dL  I reviewed the patients updated PMH, FH, and SocHx.    Patient Active Problem List   Diagnosis Date Noted   Atherosclerosis of aorta ([place]) 03/02/2022    Priority: High   Agatston coronary artery calcium score between 200 and 399 03/02/2022    Priority: High   Essential hypertension 07/07/2021    Priority: High   History of breast cancer 02/18/2020    Priority: High   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    Priority: High   Myalgia due to  statin 02/18/2020    Priority: High   Mixed hyperlipidemia 02/18/2020    Priority: High   Multiple drug allergies 02/18/2020    Priority: High   History of asbestos exposure 03/02/2022    Priority: Medium    Osteopenia after menopause 02/11/2021    Priority: Medium    Long-term current use of proton pump inhibitor therapy 11/29/2020    Priority: Medium    Postherpetic neuralgia 04/28/2020    Priority: Medium    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    Priority: Medium    GERD (gastroesophageal reflux disease) 02/18/2020    Priority: Medium    Colon polyp 02/18/2020    Priority: Medium    Chronic midline low back pain without sciatica 02/18/2020    Priority: Medium    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    Priority: Medium    Post-traumatic arthritis of right foot 01/16/2020    Priority: Medium    Mild mitral regurgitation 03/02/2022    Priority: Low   Status post bilateral mastectomy 02/18/2020    Priority: Low   Status post shoulder replacement, 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    Priority: Low   Asystole ([place]) 08/30/2022   Nonobstructive atherosclerosis of coronary artery 08/30/2022   Spondylolisthesis of lumbar region 08/29/2022    Allergies: Baclofen, Cyclobenzaprine, Escitalopram oxalate, Iodine, Rosuvastatin, Sertraline, Simvastatin, Atorvastatin, Codeine, Contrast media [iodinated contrast media], Hydrochlorothiazide w-triamterene, Hydrocodone-acetaminophen, and Oxycodone hcl  Social History: Patient  reports that she has never smoked. She has been exposed to tobacco smoke. She has never used smokeless tobacco. She reports current alcohol use. She reports that she does not use drugs.  Current Meds  Medication Sig   aspirin EC 81 MG tablet Take 81 mg by mouth daily. Swallow whole.   azelastine (ASTELIN) 0.1 % nasal spray Place 1-2 sprays into both nostrils daily as needed for rhinitis or allergies.   carisoprodol (SOMA) 350 MG tablet Take 1 tablet (350 mg total) by mouth 3  (three) times daily as needed for muscle spasms. (Patient taking differently: Take 175-350 mg by mouth 3 (three) times daily as needed for muscle spasms.)   Cholecalciferol (VITAMIN D) 50 MCG (2000 UT) CAPS Take 2,000 Units by mouth daily.   Evolocumab (REPATHA SURECLICK) 829 MG/ML SOAJ Inject 140 mg into the skin every 14 (fourteen) days.   ezetimibe (ZETIA) 10 MG tablet TAKE 1 TABLET AT BEDTIME   fluticasone (FLONASE SENSIMIST) 27.5 MCG/SPRAY nasal spray Place 2 sprays into the nose daily as needed for rhinitis or allergies.   icosapent Ethyl (VASCEPA) 1 g capsule Take 2 capsules (2 g total) by mouth 2 (two) times daily.  losartan (COZAAR) 25 MG tablet Take 1 tablet (25 mg total) by mouth daily.   melatonin 3 MG TABS tablet Take 3 mg by mouth at bedtime as needed (sleep).   metoprolol succinate (TOPROL-XL) 25 MG 24 hr tablet TAKE 1 TABLET BY MOUTH EVERY DAY   Multiple Vitamin (MULTIVITAMIN WITH MINERALS) TABS tablet Take 1 tablet by mouth daily.   traZODone (DESYREL) 50 MG tablet Take 50 mg by mouth at bedtime as needed for sleep.   Ubiquinol 100 MG CAPS Take 100 mg by mouth daily.   [DISCONTINUED] omeprazole (PRILOSEC) 20 MG capsule TAKE 1 CAPSULE(20 MG) BY MOUTH DAILY (Patient taking differently: Take 20 mg by mouth daily as needed (acid reflux).)    Review of Systems: Cardiovascular: negative for chest pain, palpitations, leg swelling, orthopnea Respiratory: negative for SOB, wheezing or persistent cough Gastrointestinal: negative for abdominal pain Genitourinary: negative for dysuria or gross hematuria  Objective  Vitals: BP 128/70   Pulse 78   Temp 98 F (36.7 C)   Ht '4\' 11"'$  (1.499 m)   Wt 142 lb 12.8 oz (64.8 kg)   SpO2 99%   BMI 28.84 kg/m  General: no acute distress  Psych:  Alert and oriented, normal mood and affect HEENT:  Normocephalic, atraumatic, supple neck  Cardiovascular:  RRR without murmur. no edema Respiratory:  Good breath sounds bilaterally, CTAB with normal  respiratory effort Neurologic:   Mental status is normal Commons side effects, risks, benefits, and alternatives for medications and treatment plan prescribed today were discussed, and the patient expressed understanding of the given instructions. Patient is instructed to call or message via MyChart if he/she has any questions or concerns regarding our treatment plan. No barriers to understanding were identified. We discussed Red Flag symptoms and signs in detail. Patient expressed understanding regarding what to do in case of urgent or emergency type symptoms.  Medication list was reconciled, printed and provided to the patient in AVS. Patient instructions and summary information was reviewed with the patient as documented in the AVS. This note was prepared with assistance of Dragon voice recognition software. Occasional wrong-word or sound-a-like substitutions may have occurred due to the inherent limitation

## 2022-09-20 ENCOUNTER — Ambulatory Visit: Payer: Medicare Other | Attending: Cardiology

## 2022-09-20 DIAGNOSIS — I5020 Unspecified systolic (congestive) heart failure: Secondary | ICD-10-CM

## 2022-09-21 LAB — BASIC METABOLIC PANEL WITH GFR
BUN/Creatinine Ratio: 28 (ref 12–28)
BUN: 17 mg/dL (ref 8–27)
CO2: 24 mmol/L (ref 20–29)
Calcium: 10 mg/dL (ref 8.7–10.3)
Chloride: 102 mmol/L (ref 96–106)
Creatinine, Ser: 0.61 mg/dL (ref 0.57–1.00)
Glucose: 116 mg/dL — ABNORMAL HIGH (ref 70–99)
Potassium: 4.6 mmol/L (ref 3.5–5.2)
Sodium: 142 mmol/L (ref 134–144)
eGFR: 97 mL/min/1.73

## 2022-10-02 ENCOUNTER — Telehealth: Payer: Self-pay | Admitting: *Deleted

## 2022-10-02 NOTE — Telephone Encounter (Signed)
HTN -blood pressure today is 134/82, her losartan was stopped at her recent hospitalization secondary to marginally low blood pressure readings.  Her home BP logs are consistently in the 130/70-80 range.  We discussed restarting her losartan and she would like to restart it at half dose.  Will restart losartan 25 mg daily.  Repeat bmet in 1 to 2 weeks.  She will keep a blood pressure log for 2 weeks and then report her readings.   Disposition - restart losartan 25 mg daily, she will keep a blood pressure log and report those findings back.  Bmet in 1 to 2 weeks.  Limited echo at 6 week mark to follow up on decreased EF.    As referenced above from the last office visit note the pt had with Venia Carbon NP on 1/17, the pt was instructed to restart losartan 25 mg and keep a BP log for 2 weeks and then report the findings to our office thereafter:   Pt did drop off her 2 week BP log at the front desk this morning,  for Venia Carbon NP or Dr. Johney Frame to further review and advise on.  I placed the BP log on Dr. Jacolyn Reedy cart in her "to be signed folder"  Will route this message to Dr. Johney Frame to make her aware of recordings in her folder, and to review and advise on those for the pt tomorrow 2/13.  Will have the RN covering Dr. Johney Frame tomorrow 2/13 to call the pt back with Dr. Jacolyn Reedy suggestions about pts BP recordings.  Will cc the RN covering her tomorrow, for her reference and to follow-up with the pt after Dr. Johney Frame reviews her BP log.

## 2022-10-03 NOTE — Telephone Encounter (Signed)
Spoke with patient to advise that Dr Johney Frame has reviewed her blood pressure history. BP looks great, and advises patient to keep doing what she is doing. Patient verbalized understanding and had no questions.

## 2022-10-09 ENCOUNTER — Ambulatory Visit
Admission: RE | Admit: 2022-10-09 | Discharge: 2022-10-09 | Disposition: A | Discharge status: Home / Self Care | Payer: Medicare Other | Source: Ambulatory Visit | Attending: Hematology and Oncology | Admitting: Hematology and Oncology

## 2022-10-09 DIAGNOSIS — Z853 Personal history of malignant neoplasm of breast: Secondary | ICD-10-CM

## 2022-10-09 DIAGNOSIS — R599 Enlarged lymph nodes, unspecified: Secondary | ICD-10-CM

## 2022-10-10 ENCOUNTER — Ambulatory Visit (INDEPENDENT_AMBULATORY_CARE_PROVIDER_SITE_OTHER): Payer: Medicare Other

## 2022-10-10 VITALS — Wt 142.0 lb

## 2022-10-10 DIAGNOSIS — Z0000 Encounter for general adult medical examination without abnormal findings: Secondary | ICD-10-CM

## 2022-10-10 NOTE — Patient Instructions (Signed)
[name] , Thank you for taking time to come for your Medicare Wellness Visit. I appreciate your ongoing commitment to your health goals. Please review the following plan we discussed and let me know if I can assist you in the future.   These are the goals we discussed:  Goals      Patient Stated     Walk 2 miles in 1 hour to be physically strong      Patient Stated     Get more healthy         This is a list of the screening recommended for you and due dates:  Health Maintenance  Topic Date Due   DTaP/Tdap/Td vaccine (1 - Tdap) Never done   Zoster (Shingles) Vaccine (2 of 2) 05/31/2020   COVID-19 Vaccine (5 - 2023-24 season) 04/21/2022   DEXA scan (bone density measurement)  01/27/2023   Medicare Annual Wellness Visit  10/11/2023   Colon Cancer Screening  10/21/2024   Pneumonia Vaccine  Completed   Flu Shot  Completed   Hepatitis C Screening: USPSTF Recommendation to screen - Ages 18-79 yo.  Completed   HPV Vaccine  Aged Out    Advanced directives: Please bring a copy of your health care power of attorney and living will to the office at your convenience.  Conditions/risks identified: get healthier and get back surgery   Next appointment: Follow up in one year for your annual wellness visit    Preventive Care 65 Years and Older, Female Preventive care refers to lifestyle choices and visits with your health care provider that can promote health and wellness. What does preventive care include? A yearly physical exam. This is also called an annual well check. Dental exams once or twice a year. Routine eye exams. Ask your health care provider how often you should have your eyes checked. Personal lifestyle choices, including: Daily care of your teeth and gums. Regular physical activity. Eating a healthy diet. Avoiding tobacco and drug use. Limiting alcohol use. Practicing safe sex. Taking low-dose aspirin every day. Taking vitamin and mineral supplements as recommended  by your health care provider. What happens during an annual well check? The services and screenings done by your health care provider during your annual well check will depend on your age, overall health, lifestyle risk factors, and family history of disease. Counseling  Your health care provider may ask you questions about your: Alcohol use. Tobacco use. Drug use. Emotional well-being. Home and relationship well-being. Sexual activity. Eating habits. History of falls. Memory and ability to understand (cognition). Work and work Statistician. Reproductive health. Screening  You may have the following tests or measurements: Height, weight, and BMI. Blood pressure. Lipid and cholesterol levels. These may be checked every 5 years, or more frequently if you are over 81 years old. Skin check. Lung cancer screening. You may have this screening every year starting at age 66 if you have a 30-pack-year history of smoking and currently smoke or have quit within the past 15 years. Fecal occult blood test (FOBT) of the stool. You may have this test every year starting at age 59. Flexible sigmoidoscopy or colonoscopy. You may have a sigmoidoscopy every 5 years or a colonoscopy every 10 years starting at age 76. Hepatitis C blood test. Hepatitis B blood test. Sexually transmitted disease (STD) testing. Diabetes screening. This is done by checking your blood sugar (glucose) after you have not eaten for a while (fasting). You may have this done every 1-3 years. Bone  density scan. This is done to screen for osteoporosis. You may have this done starting at age 56. Mammogram. This may be done every 1-2 years. Talk to your health care provider about how often you should have regular mammograms. Talk with your health care provider about your test results, treatment options, and if necessary, the need for more tests. Vaccines  Your health care provider may recommend certain vaccines, such as: Influenza  vaccine. This is recommended every year. Tetanus, diphtheria, and acellular pertussis (Tdap, Td) vaccine. You may need a Td booster every 10 years. Zoster vaccine. You may need this after age 75. Pneumococcal 13-valent conjugate (PCV13) vaccine. One dose is recommended after age 68. Pneumococcal polysaccharide (PPSV23) vaccine. One dose is recommended after age 72. Talk to your health care provider about which screenings and vaccines you need and how often you need them. This information is not intended to replace advice given to you by your health care provider. Make sure you discuss any questions you have with your health care provider. Document Released: 09/03/2015 Document Revised: 04/26/2016 Document Reviewed: 06/08/2015 Elsevier Interactive Patient Education  2017 [employer] Prevention in the Home Falls can cause injuries. They can happen to people of all ages. There are many things you can do to make your home safe and to help prevent falls. What can I do on the outside of my home? Regularly fix the edges of walkways and driveways and fix any cracks. Remove anything that might make you trip as you walk through a door, such as a raised step or threshold. Trim any bushes or trees on the path to your home. Use bright outdoor lighting. Clear any walking paths of anything that might make someone trip, such as rocks or tools. Regularly check to see if handrails are loose or broken. Make sure that both sides of any steps have handrails. Any raised decks and porches should have guardrails on the edges. Have any leaves, snow, or ice cleared regularly. Use sand or salt on walking paths during winter. Clean up any spills in your garage right away. This includes oil or grease spills. What can I do in the bathroom? Use night lights. Install grab bars by the toilet and in the tub and shower. Do not use towel bars as grab bars. Use non-skid mats or decals in the tub or shower. If you  need to sit down in the shower, use a plastic, non-slip stool. Keep the floor dry. Clean up any water that spills on the floor as soon as it happens. Remove soap buildup in the tub or shower regularly. Attach bath mats securely with double-sided non-slip rug tape. Do not have throw rugs and other things on the floor that can make you trip. What can I do in the bedroom? Use night lights. Make sure that you have a light by your bed that is easy to reach. Do not use any sheets or blankets that are too big for your bed. They should not hang down onto the floor. Have a firm chair that has side arms. You can use this for support while you get dressed. Do not have throw rugs and other things on the floor that can make you trip. What can I do in the kitchen? Clean up any spills right away. Avoid walking on wet floors. Keep items that you use a lot in easy-to-reach places. If you need to reach something above you, use a strong step stool that has a grab bar. Keep  electrical cords out of the way. Do not use floor polish or wax that makes floors slippery. If you must use wax, use non-skid floor wax. Do not have throw rugs and other things on the floor that can make you trip. What can I do with my stairs? Do not leave any items on the stairs. Make sure that there are handrails on both sides of the stairs and use them. Fix handrails that are broken or loose. Make sure that handrails are as long as the stairways. Check any carpeting to make sure that it is firmly attached to the stairs. Fix any carpet that is loose or worn. Avoid having throw rugs at the top or bottom of the stairs. If you do have throw rugs, attach them to the floor with carpet tape. Make sure that you have a light switch at the top of the stairs and the bottom of the stairs. If you do not have them, ask someone to add them for you. What else can I do to help prevent falls? Wear shoes that: Do not have high heels. Have rubber  bottoms. Are comfortable and fit you well. Are closed at the toe. Do not wear sandals. If you use a stepladder: Make sure that it is fully opened. Do not climb a closed stepladder. Make sure that both sides of the stepladder are locked into place. Ask someone to hold it for you, if possible. Clearly mark and make sure that you can see: Any grab bars or handrails. First and last steps. Where the edge of each step is. Use tools that help you move around (mobility aids) if they are needed. These include: Canes. Walkers. Scooters. Crutches. Turn on the lights when you go into a dark area. Replace any light bulbs as soon as they burn out. Set up your furniture so you have a clear path. Avoid moving your furniture around. If any of your floors are uneven, fix them. If there are any pets around you, be aware of where they are. Review your medicines with your doctor. Some medicines can make you feel dizzy. This can increase your chance of falling. Ask your doctor what other things that you can do to help prevent falls. This information is not intended to replace advice given to you by your health care provider. Make sure you discuss any questions you have with your health care provider. Document Released: 06/03/2009 Document Revised: 01/13/2016 Document Reviewed: 09/11/2014 Elsevier Interactive Patient Education  2017 [employer].

## 2022-10-10 NOTE — Progress Notes (Signed)
I connected with  Herbert Seta on 10/10/22 by a audio enabled telemedicine application and verified that I am speaking with the correct person using two identifiers.  Patient Location: Home  Provider Location: Office/Clinic  I discussed the limitations of evaluation and management by telemedicine. The patient expressed understanding and agreed to proceed.   Subjective:   Shannon Osborne is a 67 y.o. female who presents for Medicare Annual (Subsequent) preventive examination.  Review of Systems     Cardiac Risk Factors include: advanced age (>56mn, >>61women);dyslipidemia;hypertension     Objective:    Today's Vitals   10/10/22 0905  Weight: 142 lb (64.4 kg)   Body mass index is 28.68 kg/m.     10/10/2022    9:11 AM 08/22/2022    1:30 PM 07/24/2022   12:05 PM 11/01/2021   12:59 PM 06/26/2021   10:18 AM 02/18/2021   12:05 PM  Advanced Directives  Does Patient Have a Medical Advance Directive? Yes Yes Yes Yes No Yes  Type of [place] will Healthcare Power of [place]   [place] Does patient want to make changes to medical advance directive?  No - Patient declined No - Patient declined No - Patient declined    Copy of [place] Chart? No - copy requested No - copy requested    No - copy requested  Would patient like information on creating a medical advance directive?     No - Patient declined     Current Medications (verified) Outpatient Encounter Medications as of 10/10/2022  Medication Sig   aspirin EC 81 MG tablet Take 81 mg by mouth daily. Swallow whole.   azelastine (ASTELIN) 0.1 % nasal spray Place 1-2 sprays into both nostrils daily as needed for rhinitis or allergies.   carisoprodol (SOMA) 350 MG tablet Take 1 tablet (350 mg total) by mouth 3 (three) times daily as needed for muscle spasms. (Patient taking differently: Take 175-350 mg by mouth 3  (three) times daily as needed for muscle spasms.)   Cholecalciferol (VITAMIN D) 50 MCG (2000 UT) CAPS Take 2,000 Units by mouth daily.   Evolocumab (REPATHA SURECLICK) [id number]/ML SOAJ Inject 140 mg into the skin every 14 (fourteen) days.   ezetimibe (ZETIA) 10 MG tablet TAKE 1 TABLET AT BEDTIME   fluticasone (FLONASE SENSIMIST) 27.5 MCG/SPRAY nasal spray Place 2 sprays into the nose daily as needed for rhinitis or allergies.   icosapent Ethyl (VASCEPA) 1 g capsule Take 2 capsules (2 g total) by mouth 2 (two) times daily.   losartan (COZAAR) 25 MG tablet Take 1 tablet (25 mg total) by mouth daily.   melatonin 3 MG TABS tablet Take 3 mg by mouth at bedtime as needed (sleep).   metoprolol succinate (TOPROL-XL) 25 MG 24 hr tablet TAKE 1 TABLET BY MOUTH EVERY DAY   Multiple Vitamin (MULTIVITAMIN WITH MINERALS) TABS tablet Take 1 tablet by mouth daily.   omeprazole (PRILOSEC) 20 MG capsule Take 1 capsule (20 mg total) by mouth daily as needed (acid reflux).   traZODone (DESYREL) 50 MG tablet Take 50 mg by mouth at bedtime as needed for sleep.   Ubiquinol 100 MG CAPS Take 100 mg by mouth daily.   No facility-administered encounter medications on file as of 10/10/2022.    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   Breast cancer ([place]) 2009   BRACA 1 Pos    Chronic midline low back pain without sciatica 02/18/2020   Family history of adverse reaction to anesthesia    mother is slow to wake up   GERD (gastroesophageal reflux disease) 02/18/2020   Hyperlipemia    Hypertension    Mitral valve prolapse    Multiple drug allergies 02/18/2020   Myalgia due to statin 02/18/2020    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   MRI 05/2018   Osteopenia after menopause 02/11/2021   DEXA 01/2021 lowest T = -1.7 femur; recheck 2-3 years   Pneumonia    PONV (postoperative nausea and vomiting)    after most recent colonoscopy (2020)   Past Surgical History:  Procedure Laterality Date   ABDOMINAL HYSTERECTOMY  2002   BREAST SURGERY     right 09/10/2007, left 06/16/2009   FOOT BONE EXCISION     SHOULDER ARTHROTOMY Left 2013   and 06/2015   TONSILLECTOMY  2012   Family History  Problem Relation Age of Onset   Hypertension Mother    Colon polyps Mother    Gallstones Mother    Stroke Father    Hypertension Brother    Gallstones Brother    Hypertension Brother    Gallstones Brother    Hypertension Brother    Gallstones Brother    Social History   Socioeconomic History   Marital status: Single    Spouse name: Not on file   Number of children: Not on file   Years of education: Not on file   Highest education level: Not on file  Occupational History   Not on file  Tobacco Use   Smoking status: Never    Passive exposure: Past   Smokeless tobacco: Never  Vaping Use   Vaping Use: Never used  Substance and Sexual Activity   Alcohol use: Yes    Comment: 10 drinks a year    Drug use: Never   Sexual activity: Yes    Partners: Male  Other Topics Concern   Not on file  Social History Narrative   Not on file   Social Determinants of Health   Financial Resource Strain: Low Risk  (10/10/2022)   Overall Financial Resource Strain (CARDIA)    Difficulty of Paying Living Expenses: Not hard at all  Food Insecurity: No Food Insecurity (10/10/2022)   Hunger Vital Sign    Worried About Running Out of Food in the Last Year: Never true    Ran Out of Food in the Last Year: Never true  Transportation Needs: No Transportation Needs (10/10/2022)   PRAPARE - Hydrologist (Medical): No    Lack of Transportation (Non-Medical): No  Physical  Activity: Sufficiently Active (10/10/2022)   Exercise Vital Sign    Days of Exercise per Week: 5 days    Minutes of Exercise per Session: 60 min  Stress: No Stress Concern Present (10/10/2022)   [place]    Feeling of Stress : Not at all  Social Connections: Socially Isolated (10/10/2022)   Social Connection and Isolation Panel [NHANES]    Frequency of Communication with Friends and Family: More than three times a week    Frequency of Social Gatherings with Friends and Family: More than three times a week    Attends Religious Services: Never  Active Member of Clubs or Organizations: No    Attends Archivist Meetings: Never    Marital Status: Never married    Tobacco Counseling Counseling given: Not Answered   Clinical Intake:  Pre-visit preparation completed: Yes  Pain : No/denies pain     BMI - recorded: 28.68 Nutritional Status: BMI 25 -29 Overweight Nutritional Risks: None Diabetes: No  How often do you need to have someone help you when you read instructions, pamphlets, or other written materials from your doctor or pharmacy?: 1 - Never  Diabetic?no  Interpreter Needed?: No  Information entered by :: Charlott Rakes, LPN   Activities of Daily Living    10/10/2022    9:12 AM 08/22/2022    1:23 PM  In your present state of health, do you have any difficulty performing the following activities:  Hearing? 0   Vision? 0   Difficulty concentrating or making decisions? 0   Walking or climbing stairs? 0   Dressing or bathing? 0   Doing errands, shopping? 0 0  Preparing Food and eating ? N   Using the Toilet? N   In the past six months, have you accidently leaked urine? N   Do you have problems with loss of bowel control? N   Managing your Medications? N   Managing your Finances? N   Housekeeping or managing your Housekeeping? N     Patient Care Team: Leamon Arnt, MD as PCP -  General (Family Medicine) Freada Bergeron, MD as PCP - Cardiology (Cardiology) Verl Dicker, MD as Referring Physician (Orthopedic Surgery) Stechschulte, Nickola Major, MD as Consulting Physician (Surgery)  Indicate any recent Medical Services you may have received from other than [facility] providers in the past year (date may be approximate).     Assessment:   This is a routine wellness examination for Maxwell.  Hearing/Vision screen Hearing Screening - Comments:: Pt denies any hearing issues  Vision Screening - Comments:: Pt follows up with Dr Joya San for annual eye exams   Dietary issues and exercise activities discussed: Current Exercise Habits: Home exercise routine, Type of exercise: Other - see comments, Time (Minutes): 60, Frequency (Times/Week): 5, Weekly Exercise (Minutes/Week): 300   Goals Addressed             This Visit's Progress    Patient Stated       Get more healthy        Depression Screen    10/10/2022    9:10 AM 09/11/2022    8:57 AM 03/02/2022    9:59 AM 02/18/2021   12:03 PM 11/29/2020    8:56 AM 02/18/2020   12:58 PM  PHQ 2/9 Scores  PHQ - 2 Score 0 0 0 0 0 0  PHQ- 9 Score      3    Fall Risk    10/10/2022    9:12 AM 09/11/2022    8:57 AM 03/02/2022    9:59 AM 02/18/2021   12:08 PM 11/29/2020    8:56 AM  Fall Risk   Falls in the past year? 0 0 0 0 0  Number falls in past yr: 0 0 0 0 0  Injury with Fall? 0 0 0 0 0  Risk for fall due to : Impaired vision;Impaired balance/gait No Fall Risks No Fall Risks Impaired vision   Follow up Falls prevention discussed Falls evaluation completed Falls evaluation completed Falls prevention discussed     FALL RISK PREVENTION PERTAINING TO THE HOME:  Any stairs  in or around the home? Yes  If so, are there any without handrails? No  Home free of loose throw rugs in walkways, pet beds, electrical cords, etc? Yes  Adequate lighting in your home to reduce risk of falls? Yes   ASSISTIVE DEVICES UTILIZED TO PREVENT  FALLS:  Life alert? No  Use of a cane, walker or w/c? No  Grab bars in the bathroom? No  Shower chair or bench in shower? Yes  Elevated toilet seat or a handicapped toilet? No   TIMED UP AND GO:  Was the test performed? No .   Cognitive Function:        10/10/2022    9:13 AM 02/18/2021   12:12 PM  6CIT Screen  What Year? 0 points 0 points  What month? 0 points 0 points  What time? 0 points 0 points  Count back from 20 0 points 0 points  Months in reverse 0 points 0 points  Repeat phrase 0 points 0 points  Total Score 0 points 0 points    Immunizations Immunization History  Administered Date(s) Administered   Fluad Quad(high Dose 65+) 04/28/2020, 07/05/2022   Influenza Whole 05/22/2011   Influenza-Unspecified 06/26/2015, 05/15/2021   PFIZER(Purple Top)SARS-COV-2 Vaccination 10/03/2019, 10/28/2019, 05/28/2020, 03/24/2021   Pneumococcal Conjugate-13 03/17/2015   Pneumococcal Polysaccharide-23 01/14/2009, 11/29/2020   Zoster Recombinat (Shingrix) 04/05/2020    TDAP status: Due, Education has been provided regarding the importance of this vaccine. Advised may receive this vaccine at local pharmacy or Health Dept. Aware to provide a copy of the vaccination record if obtained from local pharmacy or Health Dept. Verbalized acceptance and understanding.  Flu Vaccine status: Up to date  Pneumococcal vaccine status: Up to date  Covid-19 vaccine status: Completed vaccines  Qualifies for Shingles Vaccine? Yes   Zostavax completed Yes   Shingrix Completed?: No.    Education has been provided regarding the importance of this vaccine. Patient has been advised to call insurance company to determine out of pocket expense if they have not yet received this vaccine. Advised may also receive vaccine at local pharmacy or Health Dept. Verbalized acceptance and understanding.  Screening Tests Health Maintenance  Topic Date Due   DTaP/Tdap/Td (1 - Tdap) Never done   Zoster Vaccines-  Shingrix (2 of 2) 05/31/2020   COVID-19 Vaccine (5 - 2023-24 season) 04/21/2022   DEXA SCAN  01/27/2023   Medicare Annual Wellness (AWV)  10/11/2023   COLONOSCOPY (Pts 45-76yr Insurance coverage will need to be confirmed)  10/21/2024   Pneumonia Vaccine 67 Years old  Completed   INFLUENZA VACCINE  Completed   Hepatitis C Screening  Completed   HPV VACCINES  Aged Out    Health Maintenance  Health Maintenance Due  Topic Date Due   DTaP/Tdap/Td (1 - Tdap) Never done   Zoster Vaccines- Shingrix (2 of 2) 05/31/2020   COVID-19 Vaccine (5 - 2023-24 season) 04/21/2022    Colorectal cancer screening: Type of screening: Colonoscopy. Completed 10/22/19. Repeat every 5 years  Mammogram status: No longer required due to not a candidate .  Bone Density status: Completed 01/26/21. Results reflect: Bone density results: OSTEOPENIA. Repeat every 2 years.  Additional Screening:  Hepatitis C Screening:  Completed 02/18/20  Vision Screening: Recommended annual ophthalmology exams for early detection of glaucoma and other disorders of the eye. Is the patient up to date with their annual eye exam?  Yes  Who is the provider or what is the name of the office in which the patient  attends annual eye exams? Dr Joya San  If pt is not established with a provider, would they like to be referred to a provider to establish care? No .   Dental Screening: Recommended annual dental exams for proper oral hygiene  Community Resource Referral / Chronic Care Management: CRR required this visit?  No   CCM required this visit?  No      Plan:     I have personally reviewed and noted the following in the patient's chart:   Medical and social history Use of alcohol, tobacco or illicit drugs  Current medications and supplements including opioid prescriptions. Patient is not currently taking opioid prescriptions. Functional ability and status Nutritional status Physical activity Advanced directives List of  other physicians Hospitalizations, surgeries, and ER visits in previous 12 months Vitals Screenings to include cognitive, depression, and falls Referrals and appointments  In addition, I have reviewed and discussed with patient certain preventive protocols, quality metrics, and best practice recommendations. A written personalized care plan for preventive services as well as general preventive health recommendations were provided to patient.     Willette Brace, LPN   [id number]   Nurse Notes: none

## 2022-10-11 ENCOUNTER — Ambulatory Visit (HOSPITAL_COMMUNITY): Payer: Medicare Other | Attending: Cardiology

## 2022-10-11 DIAGNOSIS — I5020 Unspecified systolic (congestive) heart failure: Secondary | ICD-10-CM | POA: Diagnosis present

## 2022-10-11 LAB — ECHOCARDIOGRAM LIMITED
Area-P 1/2: 3.31 cm2
S' Lateral: 2.2 cm

## 2022-10-18 ENCOUNTER — Other Ambulatory Visit: Payer: Self-pay | Admitting: Neurological Surgery

## 2022-10-18 NOTE — Progress Notes (Signed)
Cardiology Office Note:    Date:  10/19/2022   ID:  Shannon Osborne, DOB 08/27/1955, MRN [id number]  PCP:  Shannon Ora, MD   [facility] Providers Cardiologist:  Meriam Sprague, MD     Referring MD: Shannon Ora, MD   CC: follow up of recent asystole arrest, non-obstructive CAD   History of Present Illness:    Shannon Osborne is a 67 y.o. female with a hx of a recent asystolic arrest in the perioperative setting for back surgery, subsequent stress-induced cardiomyopathy, nonobstructive CAD (mild calcified plaque in the LAD, minimal calcified plaque in the LCX), PFO noted on echo, hyperlipidemia/hypertriglyceridemia (intolerant of statins on Repatha, Vascepa, and Zetia), hypertension, breast cancer status post chemo.  Scheduled to undergo back surgery, on 08/29/22 was given Versed and Propofol by anesthesia and subsequently went into asystole, no arrhythmias noted prior to asystole. After ~45 seconds patient returned to sinus brady rate 20s without initiation of CPR. She was given 1 amp of epi and was intubated, eventually BP and HR normalized and she was extubated. Cardiology was consulted. Echo obtained (08/29/22), showed decrease in her EF 45-50%, LV demonstrated global hypokinesis, LVDP consistent with grade I DD, moderately elevated PA pressures ( ), trivial MR. Coronary CTA (08/30/22) revealed Mild non-obstructive CAD (25-49%). Consider non-atherosclerotic causes of chest pain, calcium score 520, 94th percentile for age-sex matched. Mild calcified plaque (25-49% in LAD), minimal (<25% in LCX). Losartan was stopped due to marginal blood pressures.   Recently evaluated on 09/06/22 following above incident. She was doing well from a cardiac perspective. She had started to walk again without difficulty. Her antihypertensives were held at d/c, and her BP was marginally elevated. Losartan was resumed at 25 mg daily. Repeat echo on 10/11/22 revealed normalization of her EF, 60-65%, no  RWMA, grade I DD, mild MR.   She presents today for a follow up after recent testing. She reports she has been doing well. She has been walking 2 miles/day usually 5 days of the week, without incident. She is very interested in losing weight and feels she is at a standstill. She denies chest pain, palpitations, dyspnea, pnd, orthopnea, n, v, dizziness, syncope, edema, weight gain, or early satiety.   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   Breast cancer (HCC) 2009   BRACA 1 Pos    Chronic midline low back pain without sciatica 02/18/2020   Family history of adverse reaction to anesthesia    mother is slow to wake up   GERD (gastroesophageal reflux disease) 02/18/2020   Hyperlipemia    Hypertension    Mitral valve prolapse    Multiple drug allergies 02/18/2020   Myalgia due to statin 02/18/2020   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   MRI 05/2018   Osteopenia after menopause 02/11/2021   DEXA 01/2021 lowest T = -1.7 femur; recheck 2-3 years   Pneumonia    PONV (postoperative nausea and vomiting)    after most recent colonoscopy (2020)    Past Surgical History:  Procedure Laterality Date   ABDOMINAL HYSTERECTOMY  2002   BREAST SURGERY     right 09/10/2007, left 06/16/2009   FOOT BONE EXCISION     SHOULDER ARTHROTOMY Left 2013   and 06/2015   TONSILLECTOMY  2012    Current Medications: Current Meds  Medication Sig   aspirin EC 81 MG tablet Take 81 mg by mouth daily. Swallow whole.   carisoprodol (  SOMA) 350 MG tablet Take 1 tablet (350 mg total) by mouth 3 (three) times daily as needed for muscle spasms. (Patient taking differently: Take 175-350 mg by mouth 3 (three) times daily as needed for muscle spasms.)   cholecalciferol (VITAMIN D3) 25 MCG (1000 UNIT) tablet Take 1,000 Units by mouth 2 (two) times daily.   Evolocumab (REPATHA SURECLICK) 140 MG/ML SOAJ Inject 140 mg into the skin every 14 (fourteen) days.    ezetimibe (ZETIA) 10 MG tablet TAKE 1 TABLET AT BEDTIME   fluticasone (FLONASE SENSIMIST) 27.5 MCG/SPRAY nasal spray Place 2 sprays into the nose daily as needed for rhinitis or allergies.   icosapent Ethyl (VASCEPA) 1 g capsule Take 2 capsules (2 g total) by mouth 2 (two) times daily.   losartan (COZAAR) 25 MG tablet Take 1 tablet (25 mg total) by mouth daily. (Patient taking differently: Take 25 mg by mouth every evening.)   melatonin 3 MG TABS tablet Take 3 mg by mouth at bedtime.   metoprolol succinate (TOPROL-XL) 25 MG 24 hr tablet TAKE 1 TABLET BY MOUTH EVERY DAY   Multiple Vitamin (MULTIVITAMIN WITH MINERALS) TABS tablet Take 1 tablet by mouth daily.   Omega-3 Fatty Acids (OMEGA 3 PO) Take 1 capsule by mouth 2 (two) times daily.   omeprazole (PRILOSEC) 20 MG capsule Take 1 capsule (20 mg total) by mouth daily as needed (acid reflux). (Patient taking differently: Take 20 mg by mouth daily.)   traZODone (DESYREL) 50 MG tablet Take 50 mg by mouth at bedtime as needed for sleep.   Ubiquinol 100 MG CAPS Take 100 mg by mouth daily.     Allergies:   Baclofen, Barley grass, Buckwheat, Cyclobenzaprine, Escitalopram oxalate, Iodine, Lyrica [pregabalin], Other, Rosuvastatin, Sertraline, Simvastatin, Vanilla, Atorvastatin, Codeine, Contrast media [iodinated contrast media], Hydrochlorothiazide w-triamterene, Hydrocodone-acetaminophen, and Oxycodone hcl   Social History   Socioeconomic History   Marital status: Single    Spouse name: Not on file   Number of children: Not on file   Years of education: Not on file   Highest education level: Not on file  Occupational History   Not on file  Tobacco Use   Smoking status: Never    Passive exposure: Past   Smokeless tobacco: Never  Vaping Use   Vaping Use: Never used  Substance and Sexual Activity   Alcohol use: Yes    Comment: 10 drinks a year    Drug use: Never   Sexual activity: Yes    Partners: Male  Other Topics Concern   Not on file   Social History Narrative   Not on file   Social Determinants of Health   Financial Resource Strain: Low Risk  (10/10/2022)   Overall Financial Resource Strain (CARDIA)    Difficulty of Paying Living Expenses: Not hard at all  Food Insecurity: No Food Insecurity (10/10/2022)   Hunger Vital Sign    Worried About Running Out of Food in the Last Year: Never true    Ran Out of Food in the Last Year: Never true  Transportation Needs: No Transportation Needs (10/10/2022)   PRAPARE - Administrator, Civil Service (Medical): No    Lack of Transportation (Non-Medical): No  Physical Activity: Sufficiently Active (10/10/2022)   Exercise Vital Sign    Days of Exercise per Week: 5 days    Minutes of Exercise per Session: 60 min  Stress: No Stress Concern Present (10/10/2022)   [employer] of Occupational Health - Occupational Stress Questionnaire  Feeling of Stress : Not at all  Social Connections: Socially Isolated (10/10/2022)   Social Connection and Isolation Panel [NHANES]    Frequency of Communication with Friends and Family: More than three times a week    Frequency of Social Gatherings with Friends and Family: More than three times a week    Attends Religious Services: Never    Database administrator or Organizations: No    Attends Engineer, structural: Never    Marital Status: Never married     Family History: The patient's family history includes Colon polyps in her mother; Gallstones in her brother, brother, brother, and mother; Hypertension in her brother, brother, brother, and mother; Stroke in her father.  ROS:   Please see the history of present illness.     All other systems reviewed and are negative.  EKGs/Labs/Other Studies Reviewed:    The following studies were reviewed today:  As above in note.   EKG:  EKG is  ordered today.  The ekg ordered today demonstrates SR, possible LA enlargement, HR 80 bpm, consistent with previous tracing.    Recent Labs: 03/02/2022: ALT 17; TSH 2.12 08/22/2022: Hemoglobin 11.8; Platelets 268 09/20/2022: BUN 17; Creatinine, Ser 0.61; Potassium 4.6; Sodium 142  Recent Lipid Panel    Component Value Date/Time   CHOL 166 02/07/2022 0814   TRIG 285 (H) 02/07/2022 0814   HDL 47 02/07/2022 0814   CHOLHDL 3.5 02/07/2022 0814   CHOLHDL 3 11/29/2020 0923   VLDL 32.0 11/29/2020 0923   LDLCALC 73 02/07/2022 0814   LDLDIRECT 238 (H) 10/27/2021 1102   LDLDIRECT 85.0 02/18/2020 1354     Risk Assessment/Calculations:                Physical Exam:    VS:  BP 112/74   Pulse 83   Ht 4' 11.5" (1.511 m)   Wt 145 lb 6.4 oz (66 kg)   SpO2 95%   BMI 28.88 kg/m     Wt Readings from Last 3 Encounters:  10/19/22 145 lb 6.4 oz (66 kg)  10/10/22 142 lb (64.4 kg)  09/11/22 142 lb 12.8 oz (64.8 kg)     GEN:  Well nourished, well developed in no acute distress HEENT: Normal NECK: No JVD; No carotid bruits LYMPHATICS: No lymphadenopathy CARDIAC: RRR, no murmurs, rubs, gallops RESPIRATORY:  Clear to auscultation without rales, wheezing or rhonchi  ABDOMEN: Soft, non-tender, non-distended MUSCULOSKELETAL:  No edema; No deformity  SKIN: Warm and dry NEUROLOGIC:  Alert and oriented x 3 PSYCHIATRIC:  Normal affect   ASSESSMENT:    1. Hypertension, unspecified type   2. Stress-induced cardiomyopathy   3. CAD in native artery   4. Statin intolerance   5. Asystole (HCC)   6. Mixed hyperlipidemia   7. BMI 28.0-28.9,adult     PLAN:    In order of problems listed above:  1.History of Asystole with planned L5-S1 laminectomy - surgery tentatively scheduled for 10/31/22. Ischemic workup was reassuring, only mild LV dysfunction noted that has now resolved. Discussed with Dr. Jens Som last visit who felt it was OK to proceed with surgery if exam and functional status was benign. There are no new cardiac concerns today therefore this recommendation still applies. She may hold aspirin for 7 days prior  to surgery at discretion of surgeon if felt necessary. We do not have a formal clearance request received, so will route copy of note to neurosurgeon via Epic.  2. HFmrEF/stress induced cardiomyopathy- initial echo  echo following her asystolic event revealed mildly reduced ejection fraction of 45 to 50%.  Repeat echo on 10/11/22 showed normalization of her EF with grade I DD and mild MR. Continue loartan and Toprol.    3. CAD with HLD - Stable with no anginal symptoms. Coronary CTA on 08/30/22 revealed Mild non-obstructive CAD (25-49%), calcium score 520, 94th percentile for age-sex matched. Mild calcified plaque (25-49% in LAD), minimal (<25% in LCX). GDMT ASA, metoprolol. She is on PCSK9i, Zetia, Vascepa.  She mentions that if her next lipid panel is elevated, she would be willing to trial pravastatin every other day as she had completed some research on the benefits of statin therapy. Plan to recheck labs prior to next OV.  4. HTN -BP today 112/74, currently well controlled on losartan 25 mg daily and Toprol 25mg  daily.  5. BMI 28 - she has been making lifestyle modifications and is frustrated at the lack of results, discussed [facility], will refer her to [facility]. Discussed Silver Sneakers, she plans to stop by the [facility] this week to see if she has this benefit.   Disposition - has follow up appointment with Dr. Shari Prows on 12/05/21 that she would like to keep. Repeat FLP and LFT prior to this appointment so she can review them with Dr. Shari Prows. Ambulatory referral to [facility].   Medication Adjustments/Labs and Tests Ordered: Current medicines are reviewed at length with the patient today.  Concerns regarding medicines are outlined above.  Orders Placed This Encounter  Procedures   Lipid Profile   Hepatic function panel   Amb Ref to [facility]   No orders of the defined types were placed in this encounter.   Patient Instructions  Medication  Instructions:   Your physician recommends that you continue on your current medications as directed. Please refer to the Current Medication list given to you today.   *If you need a refill on your cardiac medications before your next appointment, please call your pharmacy*   Lab Work:  Your physician recommends that you return for a FASTING lipid profile/lft on Monday, April 15. You can come in on the day of your appointment anytime between 7:30-4:30 fasting from midnight the night before.     If you have labs (blood work) drawn today and your tests are completely normal, you will receive your results only by: MyChart Message (if you have MyChart) OR A paper copy in the mail If you have any lab test that is abnormal or we need to change your treatment, we will call you to review the results.   Testing/Procedures:  None ordered.   Follow-Up: At [facility], you and your health needs are our priority.  As part of our continuing mission to provide you with exceptional heart care, we have created designated Provider Care Teams.  These Care Teams include your primary Cardiologist (physician) and Advanced Practice Providers (APPs -  Physician Assistants and Nurse Practitioners) who all work together to provide you with the care you need, when you need it.  We recommend signing up for the patient portal called "MyChart".  Sign up information is provided on this After Visit Summary.  MyChart is used to connect with patients for Virtual Visits (Telemedicine).  Patients are able to view lab/test results, encounter notes, upcoming appointments, etc.  Non-urgent messages can be sent to your provider as well.   To learn more about what you can do with MyChart, go to [email or web address].  Your next appointment:   2 month(s)  Provider:   Meriam Sprague, MD     Other Instructions  You have been referred to [facility].  The take several months to contact  you.      Signed, Flossie Dibble, NP  10/19/2022 5:01 PM    [facility]

## 2022-10-19 ENCOUNTER — Encounter: Payer: Self-pay | Admitting: Physician Assistant

## 2022-10-19 ENCOUNTER — Ambulatory Visit: Payer: Medicare Other | Attending: Physician Assistant | Admitting: Cardiology

## 2022-10-19 VITALS — BP 112/74 | HR 83 | Ht 59.5 in | Wt 145.4 lb

## 2022-10-19 DIAGNOSIS — Z6828 Body mass index (BMI) 28.0-28.9, adult: Secondary | ICD-10-CM | POA: Diagnosis present

## 2022-10-19 DIAGNOSIS — I5181 Takotsubo syndrome: Secondary | ICD-10-CM | POA: Diagnosis not present

## 2022-10-19 DIAGNOSIS — I2510 Atherosclerotic heart disease of native coronary artery without angina pectoris: Secondary | ICD-10-CM | POA: Diagnosis not present

## 2022-10-19 DIAGNOSIS — I469 Cardiac arrest, cause unspecified: Secondary | ICD-10-CM

## 2022-10-19 DIAGNOSIS — Z789 Other specified health status: Secondary | ICD-10-CM | POA: Diagnosis not present

## 2022-10-19 DIAGNOSIS — E782 Mixed hyperlipidemia: Secondary | ICD-10-CM | POA: Diagnosis present

## 2022-10-19 DIAGNOSIS — I10 Essential (primary) hypertension: Secondary | ICD-10-CM | POA: Diagnosis not present

## 2022-10-19 NOTE — Patient Instructions (Signed)
Medication Instructions:   Your physician recommends that you continue on your current medications as directed. Please refer to the Current Medication list given to you today.   *If you need a refill on your cardiac medications before your next appointment, please call your pharmacy*   Lab Work:  Your physician recommends that you return for a FASTING lipid profile/lft on Monday, April 15. You can come in on the day of your appointment anytime between 7:30-4:30 fasting from midnight the night before.     If you have labs (blood work) drawn today and your tests are completely normal, you will receive your results only by: [place] (if you have MyChart) OR A paper copy in the mail If you have any lab test that is abnormal or we need to change your treatment, we will call you to review the results.   Testing/Procedures:  None ordered.   Follow-Up: At [facility], you and your health needs are our priority.  As part of our continuing mission to provide you with exceptional heart care, we have created designated Provider Care Teams.  These Care Teams include your primary Cardiologist (physician) and Advanced Practice Providers (APPs -  Physician Assistants and Nurse Practitioners) who all work together to provide you with the care you need, when you need it.  We recommend signing up for the patient portal called "MyChart".  Sign up information is provided on this After Visit Summary.  MyChart is used to connect with patients for Virtual Visits (Telemedicine).  Patients are able to view lab/test results, encounter notes, upcoming appointments, etc.  Non-urgent messages can be sent to your provider as well.   To learn more about what you can do with MyChart, go to [email or web address].    Your next appointment:   2 month(s)  Provider:   Freada Bergeron, MD     Other Instructions  You have been referred to [facility].  The take several months to  contact you.

## 2022-10-20 DIAGNOSIS — U071 COVID-19: Secondary | ICD-10-CM

## 2022-10-20 HISTORY — DX: COVID-19: U07.1

## 2022-10-23 ENCOUNTER — Other Ambulatory Visit: Payer: Self-pay

## 2022-10-23 DIAGNOSIS — I2510 Atherosclerotic heart disease of native coronary artery without angina pectoris: Secondary | ICD-10-CM

## 2022-10-23 DIAGNOSIS — E782 Mixed hyperlipidemia: Secondary | ICD-10-CM

## 2022-10-23 DIAGNOSIS — Z79899 Other long term (current) drug therapy: Secondary | ICD-10-CM

## 2022-10-23 DIAGNOSIS — Z789 Other specified health status: Secondary | ICD-10-CM

## 2022-10-23 MED ORDER — ICOSAPENT ETHYL 1 G PO CAPS: 2.0000 g | ORAL_CAPSULE | Freq: Two times a day (BID) | ORAL | 3 refills | Status: DC

## 2022-10-23 NOTE — Pre-Procedure Instructions (Addendum)
Surgical Instructions    Your procedure is scheduled on October 31, 2022.  Report to [facility] Main Entrance "A" at 5:30 A.M., then check in with the Admitting office.  Call this number if you have problems the morning of surgery:  [phone]  If you have any questions prior to your surgery date call [phone]: Open Monday-Friday 8am-4pm If you experience any cold or flu symptoms such as cough, fever, chills, shortness of breath, etc. between now and your scheduled surgery, please notify us at the above number.     Remember:  Do not eat after midnight the night before your surgery  You may drink clear liquids until 4:30 AM the morning of your surgery.   Clear liquids allowed are: Water, Non-Citrus Juices (without pulp), Carbonated Beverages, Clear Tea, Black Coffee Only (NO MILK, CREAM OR POWDERED CREAMER of any kind), and Gatorade.     Take these medicines the morning of surgery with A SIP OF WATER:  metoprolol succinate (TOPROL-XL)   omeprazole (PRILOSEC)      May take these medicines IF NEEDED:  fluticasone (FLONASE SENSIMIST) nasal spray   carisoprodol (SOMA)    Follow your surgeon's instructions on when/if to stop icosapent Ethyl (VASCEPA).  STOP taking your Evolocumab (REPATHA SURECLICK) three days prior to surgery.  Follow your surgeon's instructions on when to stop Aspirin.  If no instructions were given by your surgeon then you will need to call the office to get those instructions.     As of today, STOP taking any Aleve, Naproxen, Ibuprofen, Motrin, Advil, Goody's, BC's, all herbal medications, fish oil, and all vitamins.                     Do NOT Smoke (Tobacco/Vaping) for 24 hours prior to your procedure.  If you use a CPAP at night, you may bring your mask/headgear for your overnight stay.   Contacts, glasses, piercing's, hearing aid's, dentures or partials may not be worn into surgery, please bring cases for these belongings.    For patients admitted to  the hospital, discharge time will be determined by your treatment team.   Patients discharged the day of surgery will not be allowed to drive home, and someone needs to stay with them for 24 hours.  SURGICAL WAITING ROOM VISITATION Patients having surgery or a procedure may have no more than 2 support people in the waiting area - these visitors may rotate.   Children under the age of 28 must have an adult with them who is not the patient. If the patient needs to stay at the hospital during part of their recovery, the visitor guidelines for inpatient rooms apply. Pre-op nurse will coordinate an appropriate time for 1 support person to accompany patient in pre-op.  This support person may not rotate.   Please refer to the [facility] website for the visitor guidelines for Inpatients (after your surgery is over and you are in a regular room).    Special instructions:   [facility]- Preparing For Surgery  Before surgery, you can play an important role. Because skin is not sterile, your skin needs to be as free of germs as possible. You can reduce the number of germs on your skin by washing with CHG (chlorahexidine gluconate) Soap before surgery.  CHG is an antiseptic cleaner which kills germs and bonds with the skin to continue killing germs even after washing.    Oral Hygiene is also important to reduce your risk of infection.  Remember - BRUSH YOUR TEETH THE MORNING OF SURGERY WITH YOUR REGULAR TOOTHPASTE  Please do not use if you have an allergy to CHG or antibacterial soaps. If your skin becomes reddened/irritated stop using the CHG.  Do not shave (including legs and underarms) for at least 48 hours prior to first CHG shower. It is OK to shave your face.  Please follow these instructions carefully.   Shower the NIGHT BEFORE SURGERY and the MORNING OF SURGERY  If you chose to wash your hair, wash your hair first as usual with your normal shampoo.  After you shampoo, rinse your hair and  body thoroughly to remove the shampoo.  Use CHG Soap as you would any other liquid soap. You can apply CHG directly to the skin and wash gently with a scrungie or a clean washcloth.   Apply the CHG Soap to your body ONLY FROM THE NECK DOWN.  Do not use on open wounds or open sores. Avoid contact with your eyes, ears, mouth and genitals (private parts). Wash Face and genitals (private parts)  with your normal soap.   Wash thoroughly, paying special attention to the area where your surgery will be performed.  Thoroughly rinse your body with warm water from the neck down.  DO NOT shower/wash with your normal soap after using and rinsing off the CHG Soap.  Pat yourself dry with a CLEAN TOWEL.  Wear CLEAN PAJAMAS to bed the night before surgery  Place CLEAN SHEETS on your bed the night before your surgery  DO NOT SLEEP WITH PETS.   Day of Surgery: Take a shower with CHG soap. Do not wear jewelry or makeup Do not wear lotions, powders, perfumes/colognes, or deodorant. Do not shave 48 hours prior to surgery.  Men may shave face and neck. Do not bring valuables to the hospital.  [facility] is not responsible for any belongings or valuables. Do not wear nail polish, gel polish, artificial nails, or any other type of covering on natural nails (fingers and toes) If you have artificial nails or gel coating that need to be removed by a nail salon, please have this removed prior to surgery. Artificial nails or gel coating may interfere with anesthesia's ability to adequately monitor your vital signs.  Wear Clean/Comfortable clothing the morning of surgery Remember to brush your teeth WITH YOUR REGULAR TOOTHPASTE.   Please read over the following fact sheets that you were given.    If you received a COVID test during your pre-op visit  it is requested that you wear a mask when out in public, stay away from anyone that may not be feeling well and notify your surgeon if you develop symptoms. If  you have been in contact with anyone that has tested positive in the last 10 days please notify you surgeon.

## 2022-10-24 ENCOUNTER — Other Ambulatory Visit: Payer: Self-pay

## 2022-10-24 ENCOUNTER — Encounter (HOSPITAL_COMMUNITY)
Admission: RE | Admit: 2022-10-24 | Discharge: 2022-10-24 | Disposition: A | Discharge status: Home / Self Care | Payer: Medicare Other | Source: Ambulatory Visit | Attending: Neurological Surgery | Admitting: Neurological Surgery

## 2022-10-24 ENCOUNTER — Encounter (HOSPITAL_COMMUNITY): Payer: Self-pay

## 2022-10-24 VITALS — BP 125/79 | HR 74 | Temp 98.0°F | Resp 17 | Ht 59.0 in | Wt 143.0 lb

## 2022-10-24 DIAGNOSIS — I2510 Atherosclerotic heart disease of native coronary artery without angina pectoris: Secondary | ICD-10-CM | POA: Diagnosis not present

## 2022-10-24 DIAGNOSIS — D649 Anemia, unspecified: Secondary | ICD-10-CM | POA: Insufficient documentation

## 2022-10-24 DIAGNOSIS — Q2112 Patent foramen ovale: Secondary | ICD-10-CM | POA: Diagnosis not present

## 2022-10-24 DIAGNOSIS — Z01818 Encounter for other preprocedural examination: Secondary | ICD-10-CM

## 2022-10-24 DIAGNOSIS — I340 Nonrheumatic mitral (valve) insufficiency: Secondary | ICD-10-CM | POA: Diagnosis not present

## 2022-10-24 DIAGNOSIS — R079 Chest pain, unspecified: Secondary | ICD-10-CM | POA: Diagnosis not present

## 2022-10-24 DIAGNOSIS — I10 Essential (primary) hypertension: Secondary | ICD-10-CM | POA: Diagnosis not present

## 2022-10-24 DIAGNOSIS — Z01812 Encounter for preprocedural laboratory examination: Secondary | ICD-10-CM | POA: Diagnosis not present

## 2022-10-24 HISTORY — DX: Fibromyalgia: M79.7

## 2022-10-24 LAB — SURGICAL PCR SCREEN
MRSA, PCR: NEGATIVE
Staphylococcus aureus: POSITIVE — AB

## 2022-10-24 LAB — BASIC METABOLIC PANEL
Anion gap: 8 (ref 5–15)
BUN: 15 mg/dL (ref 8–23)
CO2: 28 mmol/L (ref 22–32)
Calcium: 9.5 mg/dL (ref 8.9–10.3)
Chloride: 101 mmol/L (ref 98–111)
Creatinine, Ser: 0.69 mg/dL (ref 0.44–1.00)
GFR, Estimated: >60 mL/min (ref >60)
Glucose, Bld: 104 mg/dL — ABNORMAL HIGH (ref 70–99)
Potassium: 4.3 mmol/L (ref 3.5–5.1)
Sodium: 137 mmol/L (ref 135–145)

## 2022-10-24 LAB — CBC
HCT: 36.4 % (ref 36.0–46.0)
Hemoglobin: 11.7 g/dL — ABNORMAL LOW (ref 12.0–15.0)
MCH: 29.3 pg (ref 26.0–34.0)
MCHC: 32.1 g/dL (ref 30.0–36.0)
MCV: 91.2 fL (ref 80.0–100.0)
Platelets: 257 K/uL (ref 150–400)
RBC: 3.99 MIL/uL (ref 3.87–5.11)
RDW: 12.2 % (ref 11.5–15.5)
WBC: 6.4 K/uL (ref 4.0–10.5)
nRBC: 0 % (ref 0.0–0.2)

## 2022-10-24 LAB — TYPE AND SCREEN
ABO/RH(D): A POS
Antibody Screen: NEGATIVE

## 2022-10-24 NOTE — Progress Notes (Signed)
PCP - Billey Chang, MD Cardiologist - Gwyndolyn Kaufman, MD  PPM/ICD - Denies  Chest x-ray - Denies EKG - 08/29/2022 Stress Test - Denies ECHO - 10/11/2022 Cardiac Cath - Denies  Sleep Study - Denies  DM: Denies  Blood Thinner Instructions: Hold Repatha Sureclick 3 days prior to procedure. Stop Vascepa 5-7 days prior to procedure per surgeon. Aspirin Instructions: Hold 5-7 days prior to procedure per cardiologist  ERAS Protcol - Yes PRE-SURGERY Ensure or G2- No drink  COVID TEST- n/a   Anesthesia review: Yes, cardiac arrest on 08/29/2022, cardiac clearance note on 10/19/2022  Patient denies shortness of breath, fever, cough and chest pain at PAT appointment   All instructions explained to the patient, with a verbal understanding of the material. Patient agrees to go over the instructions while at home for a better understanding.The opportunity to ask questions was provided.

## 2022-10-25 NOTE — Anesthesia Preprocedure Evaluation (Addendum)
Anesthesia Evaluation  Patient identified by MRN, date of birth, ID band Patient awake    Reviewed: Allergy & Precautions, NPO status , Patient's Chart, lab work & pertinent test results  History of Anesthesia Complications (+) PONV and history of anesthetic complications  Airway Mallampati: II  TM Distance: >3 FB Neck ROM: Full    Dental no notable dental hx. (+) Dental Advisory Given   Pulmonary neg pulmonary ROS   Pulmonary exam normal        Cardiovascular hypertension, Pt. on home beta blockers Normal cardiovascular exam  IMPRESSIONS     1. Limited study for LV function   2. Left ventricular ejection fraction, by estimation, is 60 to 65%. The  left ventricle has normal function. The left ventricle has no regional  wall motion abnormalities. Left ventricular diastolic parameters are  consistent with Grade I diastolic  dysfunction (impaired relaxation).   3. Mild mitral valve regurgitation. There is mild late systolic prolapse  of the middle scallop of the posterior leaflet of the mitral valve.   4. The aortic valve is tricuspid. Aortic valve regurgitation is not  visualized.   Comparison(s): Changes from prior study are noted. 08/29/2022: LVEF 45-50%,  global hypokinesis. Compared with this prior study, the LVEF has  normalized.     Neuro/Psych negative neurological ROS     GI/Hepatic Neg liver ROS,GERD  ,,  Endo/Other  negative endocrine ROS    Renal/GU negative Renal ROS     Musculoskeletal negative musculoskeletal ROS (+)    Abdominal   Peds  Hematology negative hematology ROS (+)   Anesthesia Other Findings   Reproductive/Obstetrics                             Anesthesia Physical Anesthesia Plan  ASA: 3  Anesthesia Plan: General   Post-op Pain Management: Tylenol PO (pre-op)*   Induction: Intravenous  PONV Risk Score and Plan: 4 or greater and Ondansetron,  Dexamethasone, Midazolam and TIVA  Airway Management Planned: Oral ETT  Additional Equipment: Arterial line  Intra-op Plan:   Post-operative Plan: Extubation in OR  Informed Consent: I have reviewed the patients History and Physical, chart, labs and discussed the procedure including the risks, benefits and alternatives for the proposed anesthesia with the patient or authorized representative who has indicated his/her understanding and acceptance.     Dental advisory given  Plan Discussed with: Anesthesiologist and CRNA  Anesthesia Plan Comments: (PAT note by Antionette Poles, PA-C: 67 year old female for L5-S1 fusion with Dr. Maurice Small on 10/31/2022.  Patient was originally planned to have this procedure on 08/29/2022, however, procedure was aborted as patient experienced brief asystole arrest upon induction of anesthesia.  She was initially given 1 dose of Versed followed by 2 doses of propofol.  After the second dose of propofol, anesthesia noted the patient to be asystolic on telemetry.  After approximately 45 seconds, patient came out of asystole and was in bradycardia with heart rate in the 20s.  She was given a single dose of epinephrine and intubated.  Blood pressure and heart rate normalized patient was subsequently extubated.  She was admitted for further cardiology evaluation.  Echo showed mild myopathy with EF 45 to 50% with global hypokinesis, grade 1 DD, normal RV function, moderately elevated pulmonary artery systolic pressure.  She also had mildly elevated high-sensitivity troponin.  Cardiology recommended obtaining coronary CTA which showed mild calcified plaque (25-49%) in the LAD and minimal calcified plaque (<  25%) in the LCX.  Recommended preventative therapy and risk factor modification for mild non-obstructive CAD.  Repeat echo 10/11/2022 showed normalization of her EF 60 to 65%, no regional wall motion abnormalities, grade 1 DD, mild MR.  Patient had outpatient follow-up with  cardiology on 10/19/2022.  Per note she was doing well, walking 2 miles per day 5 days/week without incident.  She was also cleared for surgery at that time.  Per note, "History of Asystole with planned L5-S1 laminectomy -surgery tentatively scheduled for 10/31/22. Ischemic workup was reassuring, only mild LV dysfunction noted that has now resolved. Discussed with Dr. Jens Som last visit who felt it was OK to proceed with surgery if exam and functional status was benign. There are no new cardiac concerns today therefore this recommendation still applies. She may hold aspirin for 7 days prior to surgery at discretion of surgeon if felt necessary. We do not have a formal clearance request received, so will route copy of note to neurosurgeon via Epic."  Preop labs reviewed, mild anemia with hemoglobin 11.7, otherwise unremarkable.  EKG 10/19/2022 (read per cardiology note same date, tracing not yet available in epic): Sinus rhythm, possible LAE, rate 80, similar to prior tracing.  TTE 10/11/2022: 1. Limited study for LV function  2. Left ventricular ejection fraction, by estimation, is 60 to 65%. The  left ventricle has normal function. The left ventricle has no regional  wall motion abnormalities. Left ventricular diastolic parameters are  consistent with Grade I diastolic  dysfunction (impaired relaxation).  3. Mild mitral valve regurgitation. There is mild late systolic prolapse  of the middle scallop of the posterior leaflet of the mitral valve.  4. The aortic valve is tricuspid. Aortic valve regurgitation is not  visualized.   Comparison(s): Changes from prior study are noted. 08/29/2022: LVEF 45-50%,  global hypokinesis. Compared with this prior study, the LVEF has  normalized.   Coronary CT 08/30/2022: IMPRESSION: 1. Coronary calcium score of 520. This was 94th percentile for age-, sex, and race-matched controls.  2. Normal coronary origin with left dominance.  3. Mild calcified  plaque (25-49%) in the LAD.  4. Minimal calcified plaque (<25%) in the LCX.  5. Small PFO.  RECOMMENDATIONS: 1. Mild non-obstructive CAD (25-49%). Consider non-atherosclerotic causes of chest pain. Consider preventive therapy and risk factor modification.     )        Anesthesia Quick Evaluation

## 2022-10-25 NOTE — Progress Notes (Signed)
Anesthesia Chart Review:  67 year old female for L5-S1 fusion with Dr. Zada Finders on 10/31/2022.  Patient was originally planned to have this procedure on 08/29/2022, however, procedure was aborted as patient experienced brief asystole arrest upon induction of anesthesia.  She was initially given 1 dose of Versed followed by 2 doses of propofol.  After the second dose of propofol, anesthesia noted the patient to be asystolic on telemetry.  After approximately 45 seconds, patient came out of asystole and was in bradycardia with heart rate in the 20s.  She was given a single dose of epinephrine and intubated.  Blood pressure and heart rate normalized patient was subsequently extubated.  She was admitted for further cardiology evaluation.  Echo showed mild myopathy with EF 45 to 50% with global hypokinesis, grade 1 DD, normal RV function, moderately elevated pulmonary artery systolic pressure.  She also had mildly elevated high-sensitivity troponin.  Cardiology recommended obtaining coronary CTA which showed mild calcified plaque (25-49%) in the LAD and minimal calcified plaque (<25%) in the LCX.  Recommended preventative therapy and risk factor modification for mild non-obstructive CAD.   Repeat echo 10/11/2022 showed normalization of her EF 60 to 65%, no regional wall motion abnormalities, grade 1 DD, mild MR.  Patient had outpatient follow-up with cardiology on 10/19/2022.  Per note she was doing well, walking 2 miles per day 5 days/week without incident.  She was also cleared for surgery at that time.  Per note, "History of Asystole with planned L5-S1 laminectomy - surgery tentatively scheduled for 10/31/22. Ischemic workup was reassuring, only mild LV dysfunction noted that has now resolved. Discussed with Dr. Stanford Breed last visit who felt it was OK to proceed with surgery if exam and functional status was benign. There are no new cardiac concerns today therefore this recommendation still applies. She may hold  aspirin for 7 days prior to surgery at discretion of surgeon if felt necessary. We do not have a formal clearance request received, so will route copy of note to neurosurgeon via Epic."  Preop labs reviewed, mild anemia with hemoglobin 11.7, otherwise unremarkable.  EKG 10/19/2022 (read per cardiology note same date, tracing not yet available in epic): Sinus rhythm, possible LAE, rate 80, similar to prior tracing.  TTE 10/11/2022:  1. Limited study for LV function   2. Left ventricular ejection fraction, by estimation, is 60 to 65%. The  left ventricle has normal function. The left ventricle has no regional  wall motion abnormalities. Left ventricular diastolic parameters are  consistent with Grade I diastolic  dysfunction (impaired relaxation).   3. Mild mitral valve regurgitation. There is mild late systolic prolapse  of the middle scallop of the posterior leaflet of the mitral valve.   4. The aortic valve is tricuspid. Aortic valve regurgitation is not  visualized.   Comparison(s): Changes from prior study are noted. 08/29/2022: LVEF 45-50%,  global hypokinesis. Compared with this prior study, the LVEF has  normalized.   Coronary CT 08/30/2022: IMPRESSION: 1. Coronary calcium score of 520. This was 94th percentile for age-, sex, and race-matched controls.   2. Normal coronary origin with left dominance.   3. Mild calcified plaque (25-49%) in the LAD.   4. Minimal calcified plaque (<25%) in the LCX.   5. Small PFO.   RECOMMENDATIONS: 1. Mild non-obstructive CAD (25-49%). Consider non-atherosclerotic causes of chest pain. Consider preventive therapy and risk factor modification.    Wynonia Musty [facility] Short Stay Center/Anesthesiology Phone [phone] 10/25/2022 4:11 PM

## 2022-10-26 ENCOUNTER — Other Ambulatory Visit (HOSPITAL_COMMUNITY): Payer: Medicare Other

## 2022-10-29 ENCOUNTER — Ambulatory Visit (HOSPITAL_COMMUNITY)
Admission: EM | Admit: 2022-10-29 | Discharge: 2022-10-29 | Disposition: A | Discharge status: Home / Self Care | Payer: Medicare Other | Attending: Nurse Practitioner | Admitting: Nurse Practitioner

## 2022-10-29 ENCOUNTER — Encounter (HOSPITAL_COMMUNITY): Payer: Self-pay

## 2022-10-29 DIAGNOSIS — U071 COVID-19: Secondary | ICD-10-CM | POA: Diagnosis not present

## 2022-10-29 MED ORDER — PAXLOVID (300/100) 20 X 150 MG & 10 X 100MG PO TBPK: 3.0000 | ORAL_TABLET | Freq: Two times a day (BID) | ORAL | 0 refills | Status: AC

## 2022-10-29 MED ORDER — PSEUDOEPH-BROMPHEN-DM 30-2-10 MG/5ML PO SYRP: 5.0000 mL | ORAL_SOLUTION | ORAL | 0 refills | Status: DC | PRN

## 2022-10-29 NOTE — ED Provider Notes (Signed)
[place]    CSN: [id number] Arrival date & time: 10/29/22  1005      History   Chief Complaint Chief Complaint  Patient presents with   Fever   Covid Positive    HPI Quinnley Eastland is a 67 y.o. adult.   Subjective:   Jammi Dacruz is a 67 y.o. adult who presents for evaluation of symptoms of a URI. Symptoms include fevers up to 101.4 degrees, hot and cold spells, chills, dry cough, facial pain, fatigue, increased thirst, runny nose, headache, myalgias, nasal congestion, sneezing, and sore throat. Onset of symptoms was 1 day ago, gradually worsening since that time. She denies any wheezing or shortness of breath. She is drinking plenty of fluids. She has tried saline nasal spray and Flonase for her symptoms. She denies any known sick contacts. Home COVID test this morning was POSITIVE. She denies any history of COVID in the past. She has has the COVID vaccine plus 2 boosters.  Denies any history of kidney disease.  She is scheduled to have back surgery on 10/31/22.   The following portions of the patient's history were reviewed and updated as appropriate: allergies, current medications, past family history, past medical history, past social history, past surgical history, and problem list.       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   MRI 05/2018   Osteopenia after menopause 02/11/2021   DEXA 01/2021 lowest T = -1.7 femur; recheck 2-3 years   Pneumonia    PONV (postoperative nausea and  vomiting)    after most recent colonoscopy (2020)    Patient Active Problem List   Diagnosis Date Noted   Asystole ([place]) 08/30/2022   Nonobstructive atherosclerosis of coronary artery 08/30/2022   Spondylolisthesis of lumbar region 08/29/2022   History of asbestos exposure 03/02/2022   Mild mitral regurgitation 03/02/2022   Atherosclerosis of aorta ([place]) 03/02/2022   Agatston coronary artery calcium score between 200 and 399 03/02/2022   Essential hypertension 07/07/2021   Osteopenia after menopause 02/11/2021   Long-term current use of proton pump inhibitor therapy 11/29/2020   Postherpetic neuralgia 04/28/2020   History of breast cancer 02/18/2020   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   GERD (gastroesophageal reflux disease) 02/18/2020   Myalgia due to statin 02/18/2020   Mixed hyperlipidemia 02/18/2020   Colon polyp 02/18/2020   Multiple drug allergies 02/18/2020   Status post bilateral mastectomy 02/18/2020   Chronic midline low back pain without sciatica 02/18/2020   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      Home Medications    Prior to Admission medications   Medication Sig Start Date End Date Taking? Authorizing Provider  aspirin EC 81 MG tablet Take 81 mg by mouth daily. Swallow whole.   Yes [provider]  brompheniramine-pseudoephedrine-DM 30-2-10 MG/5ML syrup Take 5 mLs by mouth every 4 (four) hours as needed (cough & congestion). 10/29/22  Yes Enrique Sack, FNP  cholecalciferol (VITAMIN D3) 25 MCG (1000 UNIT) tablet  Take 1,000 Units by mouth 2 (two) times daily.   Yes [provider]  Evolocumab (REPATHA SURECLICK) [id number] MG/ML SOAJ Inject 140 mg into the skin every 14 (fourteen) days. 09/04/22  Yes Freada Bergeron, MD  ezetimibe (ZETIA) 10 MG tablet TAKE 1 TABLET AT BEDTIME 07/24/22  Yes Leamon Arnt, MD  fluticasone (FLONASE SENSIMIST) 27.5 MCG/SPRAY nasal spray Place 2 sprays into the nose daily as needed for rhinitis or allergies.   Yes [provider]  icosapent Ethyl (VASCEPA) 1 g capsule Take 2 capsules (2 g total) by mouth 2 (two) times daily. 10/23/22  Yes Freada Bergeron, MD  losartan (COZAAR) 25 MG tablet Take 1 tablet (25 mg total) by mouth daily. Patient taking differently: Take 25 mg by mouth every evening. 09/06/22  Yes Venia Carbon C, NP  melatonin 3 MG TABS tablet Take 3 mg by mouth at bedtime.   Yes [provider]  Multiple Vitamin (MULTIVITAMIN WITH MINERALS) TABS tablet Take 1 tablet by mouth daily.   Yes [provider]  nirmatrelvir & ritonavir (PAXLOVID, 300/100,) 20 x 150 MG & 10 x '100MG'$  TBPK Take 3 tablets by mouth 2 (two) times daily for 5 days. 10/29/22 11/03/22 Yes Enrique Sack, FNP  Omega-3 Fatty Acids (OMEGA 3 PO) Take 1 capsule by mouth 2 (two) times daily.   Yes [provider]  Ubiquinol 100 MG CAPS Take 100 mg by mouth daily.   Yes [provider]  carisoprodol (SOMA) 350 MG tablet Take 1 tablet (350 mg total) by mouth 3 (three) times daily as needed for muscle spasms. Patient taking differently: Take 175-350 mg by mouth 3 (three) times daily as needed for muscle spasms. 09/26/21   Leamon Arnt, MD  metoprolol succinate (TOPROL-XL) 25 MG 24 hr tablet TAKE 1 TABLET BY MOUTH EVERY DAY 09/04/22   Leamon Arnt, MD  omeprazole (PRILOSEC) 20 MG capsule Take 1 capsule (20 mg total) by mouth daily as needed (acid reflux). Patient taking differently: Take 20 mg by mouth daily. 09/11/22   Leamon Arnt, MD  traZODone  (DESYREL) 50 MG tablet Take 50 mg by mouth at bedtime as needed for sleep.    [provider]    Family History Family History  Problem Relation Age of Onset   Hypertension Mother    Colon polyps Mother    Gallstones Mother    Stroke Father    Hypertension Brother    Gallstones Brother    Hypertension Brother    Gallstones Brother    Hypertension Brother    Gallstones Brother     Social History Social History   Tobacco Use   Smoking status: Never    Passive exposure: Past   Smokeless tobacco: Never  Vaping Use   Vaping Use: Never used  Substance Use Topics   Alcohol use: Yes    Comment: 10 drinks a year    Drug use: Never     Allergies  Baclofen, Barley grass, Buckwheat, Cyclobenzaprine, Escitalopram oxalate, Iodine, Lyrica [pregabalin], Other, Rosuvastatin, Sertraline, Simvastatin, Vanilla, Atorvastatin, Codeine, Contrast media [iodinated contrast media], Hydrochlorothiazide w-triamterene, Hydrocodone-acetaminophen, and Oxycodone hcl   Review of Systems Review of Systems  Constitutional:  Positive for chills, fatigue and fever. Negative for diaphoresis.  HENT:  Positive for congestion, postnasal drip, rhinorrhea, sneezing and sore throat. Negative for trouble swallowing.   Respiratory:  Positive for cough. Negative for shortness of breath and wheezing.   Gastrointestinal:  Negative for diarrhea, nausea and vomiting.  Musculoskeletal:  Positive for myalgias.  Neurological:  Positive for headaches.  All other systems reviewed and are negative.    Physical Exam Triage Vital Signs ED Triage Vitals  Enc Vitals Group     BP 10/29/22 1032 128/70     Pulse Rate 10/29/22 1032 (!) 117     Resp 10/29/22 1032 16     Temp 10/29/22 1032 98.9 F (37.2 C)     Temp Source 10/29/22 1032 Oral     SpO2 10/29/22 1032 97 %     Weight --      Height --      Head Circumference --      Peak Flow --      Pain Score 10/29/22 1029 0     Pain Loc --      Pain Edu? --       Excl. in [place]? --    No data found.  Updated Vital Signs BP 128/70 (BP Location: Right Arm)   Pulse (!) 117   Temp 98.9 F (37.2 C) (Oral)   Resp 16   SpO2 97%   Visual Acuity Right Eye Distance:   Left Eye Distance:   Bilateral Distance:    Right Eye Near:   Left Eye Near:    Bilateral Near:     Physical Exam Vitals reviewed.  Constitutional:      General: She is not in acute distress.    Appearance: Normal appearance. She is not ill-appearing or toxic-appearing.  HENT:     Head: Normocephalic.     Nose: Nose normal.     Mouth/Throat:     Mouth: Mucous membranes are moist.  Eyes:     Conjunctiva/sclera: Conjunctivae normal.  Cardiovascular:     Rate and Rhythm: Normal rate.  Pulmonary:     Effort: Pulmonary effort is normal.     Breath sounds: Normal breath sounds.  Musculoskeletal:        General: Normal range of motion.     Cervical back: Normal range of motion and neck supple.  Lymphadenopathy:     Cervical: No cervical adenopathy.  Skin:    General: Skin is warm and dry.  Neurological:     General: No focal deficit present.     Mental Status: She is alert and oriented to person, place, and time.      UC Treatments / Results  Labs (all labs ordered are listed, but only abnormal results are displayed) Labs Reviewed - No data to display  EKG   Radiology No results found.  Procedures Procedures (including critical care time)  Medications Ordered in UC Medications - No data to display  Initial Impression / Assessment and Plan / UC Course  I have reviewed the triage vital signs and the nursing notes.  Pertinent labs & imaging results that were available during my care of the patient were reviewed by me and considered in my medical decision making (see chart for details).  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 Home COVID test this morning was positive. She has has the COVID vaccine plus 2 boosters.  Discussed diagnosis and treatment of COVID-19 as well as the updated CDC guidelines regarding isolation.  Paxlovid prescribed.  Supportive care for symptom management as well as indications for ED evaluation discussed.  Advised to call her surgeon in the morning to let them know that she has tested positive for COVID-19 and would likely need to have her surgery rescheduled.  Today's evaluation has revealed no signs of a dangerous process. Discussed diagnosis with patient and/or guardian. Patient and/or guardian aware of their diagnosis, possible red flag symptoms to watch out for and need for close follow up. Patient and/or guardian understands verbal and written discharge instructions. Patient and/or guardian comfortable with plan and disposition.  Patient and/or guardian has a clear mental status at this time, good insight into illness (after discussion and teaching) and has clear judgment to make decisions regarding their care  Documentation was completed with the aid of voice recognition software. Transcription may contain typographical errors.  Final Clinical Impressions(s) / UC Diagnoses   Final diagnoses:  [id number]     Discharge Instructions      You have tested positive for COVID.  COVID is a viral infection.  Antibiotic medications are not prescribed for viral infections.  This is because antibiotics are designed to kill bacteria.  They do not kill viruses.  Take medications as prescribed.  You may continue all of your home medications as well. Drink plenty of fluids and get lots of rest.  The CDC has updated his guidelines that state that people who have tested positive for COVID-19 no longer need to isolate if symptoms are mild and there is no fever.  You may return to normal activities if  your symptoms are overall improving and you have been without a fever for 24 hours without taking fever reducing medications like Tylenol or ibuprofen.  Call Dr. Barbra Sarks office in the morning and let them know that you have tested positive for COVID and will need to have your surgery rescheduled.  Also asked them if you can resume the medications that was stopped until your surgery is rescheduled.  Go to the ED immediately if:  You have trouble breathing. You have pain or pressure in your chest. You are confused. Your lips or fingernails turn blue. You have trouble waking from sleep. Your symptoms get worse.     ED Prescriptions     Medication Sig Dispense Auth. Provider   nirmatrelvir & ritonavir (PAXLOVID, 300/100,) 20 x 150 MG & 10 x '100MG'$  TBPK Take 3 tablets by mouth 2 (two) times daily for 5 days. 30 tablet Enrique Sack, FNP   brompheniramine-pseudoephedrine-DM 30-2-10 MG/5ML syrup Take 5 mLs by mouth every 4 (four) hours as needed (cough & congestion). 120 mL Enrique Sack, FNP      PDMP not reviewed this encounter.   Enrique Sack, [place] 10/29/22 1144

## 2022-10-29 NOTE — ED Triage Notes (Signed)
Pt tested positive for COVID today , pt states she has been having a fever , headache, neck pain, body aches , tired , and thirsty. X 1day

## 2022-10-29 NOTE — Discharge Instructions (Addendum)
You have tested positive for COVID.  COVID is a viral infection.  Antibiotic medications are not prescribed for viral infections.  This is because antibiotics are designed to kill bacteria.  They do not kill viruses.  Take medications as prescribed.  You may continue all of your home medications as well. Drink plenty of fluids and get lots of rest.  The CDC has updated his guidelines that state that people who have tested positive for COVID-19 no longer need to isolate if symptoms are mild and there is no fever.  You may return to normal activities if your symptoms are overall improving and you have been without a fever for 24 hours without taking fever reducing medications like Tylenol or ibuprofen.  Call Dr. Barbra Sarks office in the morning and let them know that you have tested positive for COVID and will need to have your surgery rescheduled.  Also asked them if you can resume the medications that was stopped until your surgery is rescheduled.  Go to the ED immediately if:  You have trouble breathing. You have pain or pressure in your chest. You are confused. Your lips or fingernails turn blue. You have trouble waking from sleep. Your symptoms get worse.

## 2022-10-30 ENCOUNTER — Telehealth: Payer: Self-pay | Admitting: Family Medicine

## 2022-10-30 NOTE — Telephone Encounter (Signed)
See provider within 4 hrs  Patient Name: Shannon Osborne Gender: Female DOB: 1955-12-04 Age: 66 Y 10 M 4 D Return Phone Number: [phone] (Primary), [phone] (Secondary) Address: City/ State/ Zip: [place] [address] Client  [facility] Client Site [facility] Night Provider Billey Chang- MD Contact Type Call Who Is Calling Patient / Member / Family / Caregiver Call Type Triage / Clinical Relationship To Patient Self Return Phone Number [phone] (Primary) Chief Complaint Headache Reason for Call Medication Question / Request Initial Comment Caller has sinus pressure headache, leg pain (upper leg pain), 101.4 fever, aches, deep cough, sinus congestion. She had allergy sxs last PM. She just tested Covid + at home. She mentions Covid Rx. Translation No Nurse Assessment Nurse: Gwenlyn Perking, RN, Silvana Newness Date/Time Eilene Ghazi Time): 10/29/2022 8:40:05 AM Confirm and document reason for call. If symptomatic, describe symptoms. ---Caller states is having sinus pressure and congestion, cough, body aches, and current temp fever 101.4 (oral). Symptoms started yesterday. Treated at home OTC allergy medicine. Took COVID test this morning and was positive Does the patient have any new or worsening symptoms? ---Yes Will a triage be completed? ---Yes Related visit to physician within the last 2 weeks? ---No Does the PT have any chronic conditions? (i.e. diabetes, asthma, this includes High risk factors for pregnancy, etc.) ---Yes List chronic conditions. ---HTN Is this a behavioral health or substance abuse call? ---No Guidelines Guideline Title Affirmed Question Affirmed Notes Nurse Date/Time (Eastern Time) COVID-19 - Diagnosed or Suspected [1] Fever > 101 F (38.3 C) AND [2] age > 26 years Darlis Loan 10/29/2022 8:45:14 AM Disp. Time Eilene Ghazi Time) Disposition Final User 10/29/2022 8:52:10 AM See  HCP within 4 Hours (or PCP triage) Yes Gwenlyn Perking, RN, Silvana Newness Final Disposition 10/29/2022 8:52:10 AM See HCP within 4 Hours (or PCP triage) Yes Gwenlyn Perking, RN, Silvana Newness Caller Disagree/Comply Comply Caller Understands Yes PreDisposition Go to Urgent [place] Advice Given Per Guideline SEE HCP (OR PCP TRIAGE) WITHIN 4 HOURS: * IF OFFICE WILL BE CLOSED AND NO PCP (PRIMARY CARE PROVIDER) SECOND-LEVEL TRIAGE: You need to be seen within the next 3 or 4 hours. A nearby Urgent Care Center [facility]) is often a good source of care. Another choice is to go to the ED. Go sooner if you become worse. FEVER MEDICINES: * For fevers above 101 F (38.3 C) take either acetaminophen or ibuprofen. * They are over-the-counter (OTC) drugs that help treat both fever and pain. You can buy them at the drugstore. CALL BACK IF: * You become worse CARE ADVICE given per COVID-19 - DIAGNOSED OR SUSPECTED (Adult) guideline. Referrals [facility] [place]

## 2022-11-13 ENCOUNTER — Telehealth: Payer: Self-pay

## 2022-11-13 NOTE — Telephone Encounter (Signed)
Pharmacy Patient Advocate Encounter  Prior Authorization for REPATHA has been approved.    Effective dates: 08/21/22 through 11/13/23  Received notification from [facility] that prior authorization for [place] is needed.    PA submitted on 11/13/22 Key [id number] Status is pending  Karie Soda, [place] Patient Advocate Specialist Direct Number: [phone] Fax: [phone]

## 2022-11-27 NOTE — Progress Notes (Unsigned)
Cardiology Office Note:    Date:  11/27/2022   ID:  Shannon Osborne, DOB 09/01/1955, MRN [id number]  PCP:  Willow OraAndy, Camille L, MD   [facility] Providers Cardiologist:  Meriam SpragueHeather E Franchon Ketterman, MD {   Referring MD: Willow OraAndy, Camille L, MD     History of Present Illness:    Shannon Osborne is a 67 y.o. adult with a hx of asystolic arrest with anesthesia, breast cancer (BRCA 1 positive) s/p chemo, HLD, GERD, aortic atherosclerosis and HTN who presents to clinic for follow-up.  Prior Ca score in 09/2014 which showed Ca score 353 which was 90% for age, gender, race matched controls. Has not tolerated statins (has trialed simvastatin, crestor, lipitor, livalo).  Was initially seen in 09/2021 when her shoulder CT scan revealed atherosclerosis of the aorta, left subclavian artery and coronary arteries.  She denied any exertional chest pain at the time.  TTE  10/27/2021 showed EF 60 to 65%, RVSP 24.7 mmHg, mild MR, normal RV, grade 1 DD.   Patient admitted in 08/2022 after asystolic arrest after anesthesia. Specifically, the patient presented for planned back surgery and after receiving propofol, she developed asystole on the monitor. CPR was initiated and ROSC achieved. Coronary CTA showed mild, nonobstructive disease with Ca score 520 (94%).   TTE at that time with LVEF 45-50%, G1DD, normal RV, moderate pulmonary HTN. She recovered well and was discharged home.  Was seen in follow-up on 09/2022 where she was doing well from a CV standpoint. Repeat TTE with LVEF 60-65%, G1DD, mild MR.   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   MRI 05/2018   Osteopenia after menopause 02/11/2021   DEXA 01/2021 lowest T = -1.7 femur; recheck 2-3 years   Pneumonia    PONV (postoperative nausea and vomiting)    after most recent colonoscopy (2020)    Past Surgical History:  Procedure Laterality Date   ABDOMINAL HYSTERECTOMY  2002   BREAST SURGERY     right 09/10/2007, left 06/16/2009   FOOT BONE EXCISION     SHOULDER ARTHROTOMY Left 2013   and 06/2015   TONSILLECTOMY  2012    Current Medications: No outpatient medications have been marked as taking for the 12/06/22 encounter (Appointment) with Meriam SpraguePemberton, Esterlene Atiyeh E, MD.     Allergies:   Baclofen, Barley grass, Buckwheat, Cyclobenzaprine, Escitalopram oxalate, Iodine, Lyrica [pregabalin], Other, Rosuvastatin, Sertraline, Simvastatin, Vanilla, Atorvastatin, Codeine, Contrast media [iodinated contrast media], Hydrochlorothiazide w-triamterene, Hydrocodone-acetaminophen, and Oxycodone hcl   Social History   Socioeconomic History   Marital status: Single    Spouse name: Not on file   Number of children: Not on file   Years of education: Not on file   Highest education level: Not on file  Occupational History   Not on file  Tobacco Use   Smoking status: Never    Passive exposure: Past   Smokeless tobacco: Never  Vaping Use   Vaping Use:  Never used  Substance and Sexual Activity   Alcohol use: Yes    Comment: 10 drinks a year    Drug use: Never   Sexual activity: Yes    Partners: Male  Other Topics Concern   Not on file  Social History Narrative   Not on file   Social Determinants of Health   Financial Resource Strain: Low Risk  (10/10/2022)   Overall Financial Resource Strain (CARDIA)    Difficulty of Paying Living Expenses: Not hard at all  Food Insecurity: No Food Insecurity (10/10/2022)   Hunger Vital Sign    Worried About Running Out of Food in the  Last Year: Never true    Ran Out of Food in the Last Year: Never true  Transportation Needs: No Transportation Needs (10/10/2022)   PRAPARE - Administrator, Civil ServiceTransportation    Lack of Transportation (Medical): No    Lack of Transportation (Non-Medical): No  Physical Activity: Sufficiently Active (10/10/2022)   Exercise Vital Sign    Days of Exercise per Week: 5 days    Minutes of Exercise per Session: 60 min  Stress: No Stress Concern Present (10/10/2022)   [employer] - Occupational Stress Questionnaire    Feeling of Stress : Not at all  Social Connections: Socially Isolated (10/10/2022)   Social Connection and Isolation Panel [NHANES]    Frequency of Communication with Friends and Family: More than three times a week    Frequency of Social Gatherings with Friends and Family: More than three times a week    Attends Religious Services: Never    Database administratorActive Member of Clubs or Organizations: No    Attends Engineer, structuralClub or Organization Meetings: Never    Marital Status: Never married     Family History: The patient's family history includes Colon polyps in her mother; Gallstones in her brother, brother, brother, and mother; Hypertension in her brother, brother, brother, and mother; Stroke in her father.  ROS:   Please see the history of present illness.    Review of Systems  Constitutional:  Negative for chills and fever.  Respiratory:  Negative for shortness of breath.   Cardiovascular:  Negative for chest pain, palpitations, orthopnea, claudication, leg swelling and PND.  Gastrointestinal:  Negative for nausea and vomiting.  Musculoskeletal:  Positive for joint pain and myalgias.  Neurological:  Negative for dizziness and loss of consciousness.     EKGs/Labs/Other Studies Reviewed:    The following studies were reviewed today: Echo 08/29/22:  1. Left ventricular ejection fraction, by estimation, is 45 to 50%. The  left ventricle has mildly decreased function. The left ventricle   demonstrates global hypokinesis. Left ventricular diastolic parameters are  consistent with Grade I diastolic  dysfunction (impaired relaxation).   2. Right ventricular systolic function is normal. The right ventricular  size is normal. There is moderately elevated pulmonary artery systolic  pressure. The estimated right ventricular systolic pressure is 49.5 mmHg.   3. The mitral valve is grossly normal. Trivial mitral valve  regurgitation. No evidence of mitral stenosis.   4. The aortic valve is tricuspid. Aortic valve regurgitation is not  visualized. No aortic stenosis is present.   5. The inferior vena cava is dilated in size with >50% respiratory  variability, suggesting right atrial pressure of 8 mmHg.  _____________   CT coronary 08/30/22: MPRESSION: 1. Coronary calcium score of 520. This was 94th percentile for age-, sex, and race-matched controls.   2. Normal coronary origin with left dominance.   3.  Mild calcified plaque (25-49%) in the LAD.   4. Minimal calcified plaque (<25%) in the LCX.   5. Small PFO.   RECOMMENDATIONS: 1. Mild non-obstructive CAD (25-49%). Consider non-atherosclerotic causes of chest pain. Consider preventive therapy and risk factor modification.   IMPRESSION: No significant extracardiac findings within the visualized chest.  EKG:  EKG 06/221: NSR with no ischemic changes  Recent Labs: 03/02/2022: ALT 17; TSH 2.12 10/24/2022: BUN 15; Creatinine, Ser 0.69; Hemoglobin 11.7; Platelets 257; Potassium 4.3; Sodium 137  Recent Lipid Panel    Component Value Date/Time   CHOL 166 02/07/2022 0814   TRIG 285 (H) 02/07/2022 0814   HDL 47 02/07/2022 0814   CHOLHDL 3.5 02/07/2022 0814   CHOLHDL 3 11/29/2020 0923   VLDL 32.0 11/29/2020 0923   LDLCALC 73 02/07/2022 0814   LDLDIRECT 238 (H) 10/27/2021 1102   LDLDIRECT 85.0 02/18/2020 1354          Physical Exam:    VS:  There were no vitals taken for this visit.    Wt Readings from Last 3  Encounters:  10/24/22 143 lb (64.9 kg)  10/19/22 145 lb 6.4 oz (66 kg)  10/10/22 142 lb (64.4 kg)     GEN:  Well nourished, well developed in no acute distress HEENT: Normal NECK: No JVD; No carotid bruits CARDIAC: RRR, no murmurs, rubs, gallops RESPIRATORY:  Clear to auscultation without rales, wheezing or rhonchi  ABDOMEN: Soft, non-tender, non-distended MUSCULOSKELETAL:  No edema; No deformity  SKIN: Warm and dry NEUROLOGIC:  Alert and oriented x 3 PSYCHIATRIC:  Normal affect   ASSESSMENT:    No diagnosis found.  PLAN:    In order of problems listed above:  #Asystolic Arrest: -Occurred in the setting of anesthesia prior to back surgery -CTA without obstructive disease -TTE at that time with LVEF 45-50% that improved to 60-65% on repeat in 09/2022 -Currently doing well from a CV perspective  #Coronary Artery Disease: #Aortic Atherosclerosis: -Continue CTA with mild, nonobstructive disease, Ca score 520 -Continue ASA 81mg  daily -Intolerant to statins; continue repatha 140mg  q2weeks -Continue zetia 10mg  daily -Continue healthy diet and exercise  #HLD: #Statin Intolerance: -Intolerant to statins; continue repatha 140mg  q2weeks -Continue zetia 10mg  daily -Continue vascepa 2g BID  #HTN: -Continue metop 25mg  XL daily  #Breast Cancer s/p chemo: -Follows with Onc     Medication Adjustments/Labs and Tests Ordered: Current medicines are reviewed at length with the patient today.  Concerns regarding medicines are outlined above.  No orders of the defined types were placed in this encounter.  No orders of the defined types were placed in this encounter.   There are no Patient Instructions on file for this visit.    Signed, Meriam Sprague, MD  11/27/2022 9:20 PM     [facility]

## 2022-12-01 NOTE — Progress Notes (Signed)
Surgical Instructions    Your procedure is scheduled on Monday April 22nd.  Report to [facility] Main Entrance "A" at 5:30 A.M., then check in with the Admitting office.  Call this number if you have problems the morning of surgery:  [phone]   If you have any questions prior to your surgery date call [phone]: Open Monday-Friday 8am-4pm If you experience any cold or flu symptoms such as cough, fever, chills, shortness of breath, etc. between now and your scheduled surgery, please notify us at the above number     Remember:  Do not eat after midnight the night before your surgery  You may drink clear liquids until 4:30am the morning of your surgery.   Clear liquids allowed are: Water, Non-Citrus Juices (without pulp), Carbonated Beverages, Clear Tea, Black Coffee ONLY (NO MILK, CREAM OR POWDERED CREAMER of any kind), and Gatorade    Take these medicines the morning of surgery with A SIP OF WATER: icosapent Ethyl (VASCEPA) 1 g capsule  metoprolol succinate (TOPROL-XL) 25 MG 24 hr tablet   IF NEEDED carisoprodol (SOMA) 350 MG tablet  fluticasone (FLONASE SENSIMIST) 27.5 MCG/SPRAY nasal spray  omeprazole (PRILOSEC) 20 MG capsule    Follow your surgeon's instructions on when to stop Aspirin.  If no instructions were given by your surgeon then you will need to call the office to get those instructions.    As of today, STOP taking any Aspirin (unless otherwise instructed by your surgeon) Aleve, Naproxen, Ibuprofen, Motrin, Advil, Goody's, BC's, all herbal medications, fish oil, and all vitamins.   DAY OF SURGERY        Do not wear jewelry or makeup. Do not wear lotions, powders, perfumes or deodorant. Do not shave 48 hours prior to surgery.   Do not bring valuables to the hospital. Do not wear nail polish, gel polish, artificial nails, or any other type of covering on natural nails (fingers and toes) If you have artificial nails or gel coating that need to be removed by a nail  salon, please have this removed prior to surgery. Artificial nails or gel coating may interfere with anesthesia's ability to adequately monitor your vital signs.  [facility] is not responsible for any belongings or valuables.    Do NOT Smoke (Tobacco/Vaping)  24 hours prior to your procedure  If you use a CPAP at night, you may bring your mask for your overnight stay.   Contacts, glasses, hearing aids, dentures or partials may not be worn into surgery, please bring cases for these belongings   For patients admitted to the hospital, discharge time will be determined by your treatment team.   Patients discharged the day of surgery will not be allowed to drive home, and someone needs to stay with them for 24 hours.   SURGICAL WAITING ROOM VISITATION Patients having surgery or a procedure may have no more than 2 support people in the waiting area - these visitors may rotate.   Children under the age of 73 must have an adult with them who is not the patient. If the patient needs to stay at the hospital during part of their recovery, the visitor guidelines for inpatient rooms apply. Pre-op nurse will coordinate an appropriate time for 1 support person to accompany patient in pre-op.  This support person may not rotate.   Please refer to [email or web address] for the visitor guidelines for Inpatients (after your surgery is over and you are in a regular room).    Special instructions:  Oral Hygiene is also important to reduce your risk of infection.  Remember - BRUSH YOUR TEETH THE MORNING OF SURGERY WITH YOUR REGULAR TOOTHPASTE   [facility]- Preparing For Surgery  Before surgery, you can play an important role. Because skin is not sterile, your skin needs to be as free of germs as possible. You can reduce the number of germs on your skin by washing with CHG (chlorahexidine gluconate) Soap before surgery.  CHG is an antiseptic cleaner which  kills germs and bonds with the skin to continue killing germs even after washing.     Please do not use if you have an allergy to CHG or antibacterial soaps. If your skin becomes reddened/irritated stop using the CHG.  Do not shave (including legs and underarms) for at least 48 hours prior to first CHG shower. It is OK to shave your face.  Please follow CHG SHOWER HANDOUT instructions carefully.      Day of Surgery:  Take a shower with CHG soap. Wear Clean/Comfortable clothing the morning of surgery Do not apply any deodorants/lotions.   Remember to brush your teeth WITH YOUR REGULAR TOOTHPASTE.    If you received a COVID test during your pre-op visit, it is requested that you wear a mask when out in public, stay away from anyone that may not be feeling well, and notify your surgeon if you develop symptoms. If you have been in contact with anyone that has tested positive in the last 10 days, please notify your surgeon.    Please read over the following fact sheets that you were given.

## 2022-12-04 ENCOUNTER — Other Ambulatory Visit: Payer: Self-pay

## 2022-12-04 ENCOUNTER — Encounter (HOSPITAL_COMMUNITY): Payer: Self-pay

## 2022-12-04 ENCOUNTER — Ambulatory Visit: Payer: Medicare Other

## 2022-12-04 ENCOUNTER — Encounter
Admission: RE | Admit: 2022-12-04 | Discharge: 2022-12-04 | Disposition: A | Discharge status: Home / Self Care | Payer: Medicare Other | Source: Ambulatory Visit | Attending: Neurological Surgery | Admitting: Neurological Surgery

## 2022-12-04 VITALS — BP 130/77 | HR 82 | Temp 98.3°F | Resp 18 | Ht 59.0 in | Wt 146.2 lb

## 2022-12-04 DIAGNOSIS — I5181 Takotsubo syndrome: Secondary | ICD-10-CM | POA: Diagnosis not present

## 2022-12-04 DIAGNOSIS — E782 Mixed hyperlipidemia: Secondary | ICD-10-CM | POA: Insufficient documentation

## 2022-12-04 DIAGNOSIS — I469 Cardiac arrest, cause unspecified: Secondary | ICD-10-CM | POA: Insufficient documentation

## 2022-12-04 DIAGNOSIS — Z789 Other specified health status: Secondary | ICD-10-CM | POA: Insufficient documentation

## 2022-12-04 DIAGNOSIS — I10 Essential (primary) hypertension: Secondary | ICD-10-CM | POA: Diagnosis present

## 2022-12-04 DIAGNOSIS — I2510 Atherosclerotic heart disease of native coronary artery without angina pectoris: Secondary | ICD-10-CM | POA: Insufficient documentation

## 2022-12-04 DIAGNOSIS — Z01818 Encounter for other preprocedural examination: Secondary | ICD-10-CM | POA: Diagnosis present

## 2022-12-04 HISTORY — DX: Atherosclerotic heart disease of native coronary artery without angina pectoris: I25.10

## 2022-12-04 LAB — CBC
HCT: 38.1 % (ref 36.0–46.0)
Hemoglobin: 12.3 g/dL (ref 12.0–15.0)
MCH: 29.4 pg (ref 26.0–34.0)
MCHC: 32.3 g/dL (ref 30.0–36.0)
MCV: 91.1 fL (ref 80.0–100.0)
Platelets: 249 10*3/uL (ref 150–400)
RBC: 4.18 MIL/uL (ref 3.87–5.11)
RDW: 12.8 % (ref 11.5–15.5)
WBC: 5.1 10*3/uL (ref 4.0–10.5)
nRBC: 0 % (ref 0.0–0.2)

## 2022-12-04 LAB — BASIC METABOLIC PANEL
Anion gap: 11 (ref 5–15)
BUN: 17 mg/dL (ref 8–23)
CO2: 27 mmol/L (ref 22–32)
Calcium: 9.5 mg/dL (ref 8.9–10.3)
Chloride: 100 mmol/L (ref 98–111)
Creatinine, Ser: 0.75 mg/dL (ref 0.44–1.00)
GFR, Estimated: >60 mL/min (ref >60)
Glucose, Bld: 124 mg/dL — ABNORMAL HIGH (ref 70–99)
Potassium: 4 mmol/L (ref 3.5–5.1)
Sodium: 138 mmol/L (ref 135–145)

## 2022-12-04 LAB — TYPE AND SCREEN
ABO/RH(D): A POS
Antibody Screen: NEGATIVE

## 2022-12-04 LAB — SURGICAL PCR SCREEN
MRSA, PCR: NEGATIVE
Staphylococcus aureus: NEGATIVE

## 2022-12-04 NOTE — Progress Notes (Signed)
PCP - Dr Mardelle Matte Cardiologist - Dr Shari Prows  Chest x-ray - 07/26/21 EKG - 09/06/22 Stress Test - n/a ECHO - 10/11/22 Cardiac Cath - n/a  ICD Pacemaker/Loop - n/a  Sleep Study -  n/a CPAP - none  Diabetes - n/a.  Aspirin Instructions: Last dose was on 12/02/22.  ERAS: Clear liquids til 4:30 AM DOS  Anesthesia review: Yes  STOP now taking any Aspirin (unless otherwise instructed by your surgeon), Aleve, Naproxen, Ibuprofen, Motrin, Advil, Goody's, BC's, all herbal medications, fish oil, and all vitamins.   Coronavirus Screening Home test was positive in 10/28/22. Do you currently have any of the following symptoms:  Cough yes/no: No Fever (>100.15F)  yes/no: No Runny nose Yes r/t allergies Sore throat yes/no: No Difficulty breathing/shortness of breath  yes/no: No  Have you traveled in the last 14 days and where? yes/no: No  Patient verbalized understanding of instructions that were given to them at the PAT appointment. Patient was also instructed that they will need to review over the PAT instructions again at home before surgery.

## 2022-12-05 ENCOUNTER — Other Ambulatory Visit: Payer: Self-pay | Admitting: Cardiology

## 2022-12-05 ENCOUNTER — Other Ambulatory Visit: Payer: Self-pay | Admitting: Family Medicine

## 2022-12-05 ENCOUNTER — Ambulatory Visit: Payer: Medicare Other | Attending: Cardiology

## 2022-12-06 ENCOUNTER — Encounter: Payer: Self-pay | Admitting: Cardiology

## 2022-12-06 ENCOUNTER — Ambulatory Visit: Payer: Medicare Other | Attending: Cardiology | Admitting: Cardiology

## 2022-12-06 VITALS — BP 126/76 | HR 94 | Ht 59.0 in | Wt 144.2 lb

## 2022-12-06 DIAGNOSIS — I10 Essential (primary) hypertension: Secondary | ICD-10-CM

## 2022-12-06 DIAGNOSIS — I5020 Unspecified systolic (congestive) heart failure: Secondary | ICD-10-CM

## 2022-12-06 DIAGNOSIS — I2510 Atherosclerotic heart disease of native coronary artery without angina pectoris: Secondary | ICD-10-CM | POA: Diagnosis present

## 2022-12-06 DIAGNOSIS — E782 Mixed hyperlipidemia: Secondary | ICD-10-CM

## 2022-12-06 DIAGNOSIS — Z789 Other specified health status: Secondary | ICD-10-CM

## 2022-12-06 DIAGNOSIS — I469 Cardiac arrest, cause unspecified: Secondary | ICD-10-CM

## 2022-12-06 LAB — HEPATIC FUNCTION PANEL
ALT: 31 IU/L (ref 0–32)
AST: 29 IU/L (ref 0–40)
Albumin: 4.6 g/dL (ref 3.9–4.9)
Alkaline Phosphatase: 99 IU/L (ref 44–121)
Bilirubin Total: 0.4 mg/dL (ref 0.0–1.2)
Bilirubin, Direct: 0.12 mg/dL (ref 0.00–0.40)
Total Protein: 6.7 g/dL (ref 6.0–8.5)

## 2022-12-06 LAB — LIPID PANEL
Chol/HDL Ratio: 3.7 ratio (ref 0.0–4.4)
Cholesterol, Total: 216 mg/dL — ABNORMAL HIGH (ref 100–199)
HDL: 58 mg/dL
LDL Chol Calc (NIH): 105 mg/dL — ABNORMAL HIGH (ref 0–99)
Triglycerides: 312 mg/dL — ABNORMAL HIGH (ref 0–149)
VLDL Cholesterol Cal: 53 mg/dL — ABNORMAL HIGH (ref 5–40)

## 2022-12-06 NOTE — Patient Instructions (Signed)
Medication Instructions:   Your physician recommends that you continue on your current medications as directed. Please refer to the Current Medication list given to you today.  *If you need a refill on your cardiac medications before your next appointment, please call your pharmacy*    Follow-Up: At [facility], you and your health needs are our priority.  As part of our continuing mission to provide you with exceptional heart care, we have created designated Provider Care Teams.  These Care Teams include your primary Cardiologist (physician) and Advanced Practice Providers (APPs -  Physician Assistants and Nurse Practitioners) who all work together to provide you with the care you need, when you need it.  We recommend signing up for the patient portal called "MyChart".  Sign up information is provided on this After Visit Summary.  MyChart is used to connect with patients for Virtual Visits (Telemedicine).  Patients are able to view lab/test results, encounter notes, upcoming appointments, etc.  Non-urgent messages can be sent to your provider as well.   To learn more about what you can do with MyChart, go to [email or web address].    Your next appointment:   6 month(s)  Provider:   Heather E Pemberton, MD       

## 2022-12-07 ENCOUNTER — Telehealth: Payer: Self-pay | Admitting: *Deleted

## 2022-12-07 DIAGNOSIS — Z789 Other specified health status: Secondary | ICD-10-CM

## 2022-12-07 DIAGNOSIS — Z79899 Other long term (current) drug therapy: Secondary | ICD-10-CM

## 2022-12-07 DIAGNOSIS — E782 Mixed hyperlipidemia: Secondary | ICD-10-CM

## 2022-12-07 DIAGNOSIS — I2510 Atherosclerotic heart disease of native coronary artery without angina pectoris: Secondary | ICD-10-CM

## 2022-12-07 NOTE — Telephone Encounter (Signed)
-----   Message from Meriam Sprague, MD sent at 12/07/2022 12:54 PM EDT ----- She had missed doses of her repatha and vascepa due to issues with insurance. Can we just set her up for repeat labs in 3 months for monitoring?

## 2022-12-07 NOTE — Telephone Encounter (Signed)
The patient has been notified of the result and verbalized understanding.  All questions (if any) were answered.  Pt will come in for repeat lipids in 3 months on 03/12/23  she is aware to come fasting to this lab appt.   Pt verbalized understanding and agrees with this plan.

## 2022-12-08 ENCOUNTER — Telehealth: Payer: Self-pay | Admitting: Cardiology

## 2022-12-08 ENCOUNTER — Telehealth: Payer: Self-pay

## 2022-12-08 ENCOUNTER — Other Ambulatory Visit (HOSPITAL_COMMUNITY): Payer: Self-pay

## 2022-12-08 NOTE — Telephone Encounter (Signed)
Pharmacy Patient Advocate Encounter   Received notification from [facility] that prior authorization for REPATHA is needed.    PA submitted on 12/08/22 Key [id number] Status is pending  Haze Rushing, CPhT Pharmacy Patient Advocate Specialist Direct Number: [phone] Fax: [phone]

## 2022-12-08 NOTE — Telephone Encounter (Signed)
Pt c/o medication issue:  1. Name of Medication:   Evolocumab (REPATHA SURECLICK) 140 MG/ML SOAJ    2. How are you currently taking this medication (dosage and times per day)? Inject 140 mg into the skin every 14 (fourteen) days.   3. Are you having a reaction (difficulty breathing--STAT)? No  4. What is your medication issue? Pharmacy called to check on PA that was faxed on 12/04/2022 for above medication and would like a callback. Reference script is [id number] and store [id number]. Please advise.

## 2022-12-08 NOTE — Telephone Encounter (Signed)
Initiated, pt has a different plan now. Please see separate encounter

## 2022-12-11 ENCOUNTER — Encounter (HOSPITAL_COMMUNITY): Payer: Self-pay | Admitting: Neurological Surgery

## 2022-12-11 ENCOUNTER — Encounter (HOSPITAL_COMMUNITY)
Admission: RE | Disposition: A | Discharge status: Home / Self Care | Payer: Self-pay | Source: Ambulatory Visit | Attending: Neurological Surgery

## 2022-12-11 ENCOUNTER — Observation Stay (HOSPITAL_COMMUNITY)
Admission: RE | Admit: 2022-12-11 | Discharge: 2022-12-12 | Disposition: A | Discharge status: Home / Self Care | Payer: Medicare Other | Source: Ambulatory Visit | Attending: Neurological Surgery | Admitting: Neurological Surgery

## 2022-12-11 ENCOUNTER — Other Ambulatory Visit: Payer: Self-pay

## 2022-12-11 ENCOUNTER — Ambulatory Visit (HOSPITAL_COMMUNITY): Payer: Medicare Other | Admitting: Physician Assistant

## 2022-12-11 ENCOUNTER — Ambulatory Visit (HOSPITAL_COMMUNITY): Payer: Medicare Other | Admitting: Certified Registered"

## 2022-12-11 ENCOUNTER — Ambulatory Visit (HOSPITAL_COMMUNITY): Payer: Medicare Other

## 2022-12-11 DIAGNOSIS — I10 Essential (primary) hypertension: Secondary | ICD-10-CM | POA: Diagnosis not present

## 2022-12-11 DIAGNOSIS — Z7722 Contact with and (suspected) exposure to environmental tobacco smoke (acute) (chronic): Secondary | ICD-10-CM | POA: Diagnosis not present

## 2022-12-11 DIAGNOSIS — Z853 Personal history of malignant neoplasm of breast: Secondary | ICD-10-CM | POA: Diagnosis not present

## 2022-12-11 DIAGNOSIS — M48062 Spinal stenosis, lumbar region with neurogenic claudication: Secondary | ICD-10-CM | POA: Diagnosis not present

## 2022-12-11 DIAGNOSIS — I2510 Atherosclerotic heart disease of native coronary artery without angina pectoris: Secondary | ICD-10-CM | POA: Diagnosis not present

## 2022-12-11 DIAGNOSIS — Z8616 Personal history of COVID-19: Secondary | ICD-10-CM | POA: Insufficient documentation

## 2022-12-11 DIAGNOSIS — M4316 Spondylolisthesis, lumbar region: Secondary | ICD-10-CM

## 2022-12-11 DIAGNOSIS — I340 Nonrheumatic mitral (valve) insufficiency: Secondary | ICD-10-CM

## 2022-12-11 HISTORY — PX: TRANSFORAMINAL LUMBAR INTERBODY FUSION (TLIF) WITH PEDICLE SCREW FIXATION 1 LEVEL: SHX6141

## 2022-12-11 SURGERY — TRANSFORAMINAL LUMBAR INTERBODY FUSION (TLIF) WITH PEDICLE SCREW FIXATION 1 LEVEL
Anesthesia: General | Site: Spine Lumbar

## 2022-12-11 MED ORDER — ONDANSETRON HCL 4 MG/2ML IJ SOLN
INTRAMUSCULAR | Status: AC
  Filled 2022-12-11: qty 2

## 2022-12-11 MED ORDER — THROMBIN 5000 UNITS EX SOLR
OROMUCOSAL | Status: DC | PRN
  Administered 2022-12-11: 5 mL via TOPICAL

## 2022-12-11 MED ORDER — LACTATED RINGERS IV SOLN: INTRAVENOUS | Status: DC

## 2022-12-11 MED ORDER — CEFAZOLIN SODIUM-DEXTROSE 2-4 GM/100ML-% IV SOLN
2.0000 g | INTRAVENOUS | Status: AC
  Administered 2022-12-11: 2 g via INTRAVENOUS

## 2022-12-11 MED ORDER — CEFAZOLIN SODIUM-DEXTROSE 2-4 GM/100ML-% IV SOLN
INTRAVENOUS | Status: AC
  Filled 2022-12-11: qty 100

## 2022-12-11 MED ORDER — DOCUSATE SODIUM 100 MG PO CAPS
100.0000 mg | ORAL_CAPSULE | Freq: Two times a day (BID) | ORAL | Status: DC
  Administered 2022-12-11 – 2022-12-12 (×4): 100 mg via ORAL
  Filled 2022-12-11 (×3): qty 1

## 2022-12-11 MED ORDER — AMISULPRIDE (ANTIEMETIC) 5 MG/2ML IV SOLN: 10.0000 mg | Freq: Once | INTRAVENOUS | Status: DC | PRN

## 2022-12-11 MED ORDER — FLUTICASONE PROPIONATE 50 MCG/ACT NA SUSP: 2.0000 | Freq: Every day | NASAL | Status: DC | PRN

## 2022-12-11 MED ORDER — 0.9 % SODIUM CHLORIDE (POUR BTL) OPTIME
TOPICAL | Status: DC | PRN
  Administered 2022-12-11: 1000 mL

## 2022-12-11 MED ORDER — DEXAMETHASONE SODIUM PHOSPHATE 10 MG/ML IJ SOLN
INTRAMUSCULAR | Status: AC
  Filled 2022-12-11: qty 1

## 2022-12-11 MED ORDER — POLYETHYLENE GLYCOL 3350 17 G PO PACK: 17.0000 g | PACK | Freq: Every day | ORAL | Status: DC | PRN

## 2022-12-11 MED ORDER — TRAMADOL HCL 50 MG PO TABS: 50.0000 mg | ORAL_TABLET | Freq: Four times a day (QID) | ORAL | Status: DC | PRN

## 2022-12-11 MED ORDER — PSEUDOEPH-BROMPHEN-DM 30-2-10 MG/5ML PO SYRP: 5.0000 mL | ORAL_SOLUTION | ORAL | Status: DC | PRN

## 2022-12-11 MED ORDER — ADULT MULTIVITAMIN W/MINERALS CH
1.0000 | ORAL_TABLET | Freq: Every day | ORAL | Status: DC
  Administered 2022-12-11: 1 via ORAL
  Filled 2022-12-11: qty 1

## 2022-12-11 MED ORDER — PHENYLEPHRINE 80 MCG/ML (10ML) SYRINGE FOR IV PUSH (FOR BLOOD PRESSURE SUPPORT)
PREFILLED_SYRINGE | INTRAVENOUS | Status: AC
  Filled 2022-12-11: qty 10

## 2022-12-11 MED ORDER — CEFAZOLIN SODIUM-DEXTROSE 2-4 GM/100ML-% IV SOLN
2.0000 g | Freq: Three times a day (TID) | INTRAVENOUS | Status: AC
  Administered 2022-12-11 (×2): 2 g via INTRAVENOUS
  Filled 2022-12-11 (×2): qty 100

## 2022-12-11 MED ORDER — ACETAMINOPHEN 500 MG PO TABS: 1000.0000 mg | ORAL_TABLET | Freq: Once | ORAL | Status: AC

## 2022-12-11 MED ORDER — ONDANSETRON HCL 4 MG/2ML IJ SOLN
INTRAMUSCULAR | Status: DC | PRN
  Administered 2022-12-11: 4 mg via INTRAVENOUS

## 2022-12-11 MED ORDER — GLYCOPYRROLATE PF 0.2 MG/ML IJ SOSY
PREFILLED_SYRINGE | INTRAMUSCULAR | Status: AC
  Filled 2022-12-11: qty 1

## 2022-12-11 MED ORDER — CHLORHEXIDINE GLUCONATE CLOTH 2 % EX PADS: 6.0000 | MEDICATED_PAD | Freq: Once | CUTANEOUS | Status: DC

## 2022-12-11 MED ORDER — PROPOFOL 500 MG/50ML IV EMUL
INTRAVENOUS | Status: DC | PRN
  Administered 2022-12-11: 25 ug/kg/min via INTRAVENOUS

## 2022-12-11 MED ORDER — ICOSAPENT ETHYL 1 G PO CAPS
2.0000 g | ORAL_CAPSULE | Freq: Two times a day (BID) | ORAL | Status: DC
  Administered 2022-12-11 (×2): 2 g via ORAL
  Filled 2022-12-11 (×3): qty 2

## 2022-12-11 MED ORDER — ACETAMINOPHEN 325 MG PO TABS
650.0000 mg | ORAL_TABLET | ORAL | Status: DC | PRN
  Administered 2022-12-11 – 2022-12-12 (×3): 650 mg via ORAL
  Filled 2022-12-11 (×3): qty 2

## 2022-12-11 MED ORDER — DEXAMETHASONE SODIUM PHOSPHATE 10 MG/ML IJ SOLN
INTRAMUSCULAR | Status: DC | PRN
  Administered 2022-12-11: 10 mg via INTRAVENOUS

## 2022-12-11 MED ORDER — CARISOPRODOL 350 MG PO TABS
175.0000 mg | ORAL_TABLET | Freq: Three times a day (TID) | ORAL | Status: DC | PRN
  Administered 2022-12-11: 350 mg via ORAL
  Filled 2022-12-11: qty 1

## 2022-12-11 MED ORDER — MENTHOL 3 MG MT LOZG: 1.0000 | LOZENGE | OROMUCOSAL | Status: DC | PRN

## 2022-12-11 MED ORDER — SUGAMMADEX SODIUM 200 MG/2ML IV SOLN
INTRAVENOUS | Status: DC | PRN
  Administered 2022-12-11: 100 mg via INTRAVENOUS
  Administered 2022-12-11: 200 mg via INTRAVENOUS

## 2022-12-11 MED ORDER — LIDOCAINE 2% (20 MG/ML) 5 ML SYRINGE
INTRAMUSCULAR | Status: AC
  Filled 2022-12-11: qty 5

## 2022-12-11 MED ORDER — ACETAMINOPHEN 500 MG PO TABS
ORAL_TABLET | ORAL | Status: AC
  Administered 2022-12-11: 1000 mg via ORAL
  Filled 2022-12-11: qty 2

## 2022-12-11 MED ORDER — SODIUM CHLORIDE 0.9% FLUSH
3.0000 mL | Freq: Two times a day (BID) | INTRAVENOUS | Status: DC
  Administered 2022-12-11 (×2): 3 mL via INTRAVENOUS

## 2022-12-11 MED ORDER — ORAL CARE MOUTH RINSE: 15.0000 mL | Freq: Once | OROMUCOSAL | Status: AC

## 2022-12-11 MED ORDER — TRAZODONE HCL 50 MG PO TABS: 50.0000 mg | ORAL_TABLET | Freq: Every evening | ORAL | Status: DC | PRN

## 2022-12-11 MED ORDER — FENTANYL CITRATE (PF) 250 MCG/5ML IJ SOLN
INTRAMUSCULAR | Status: DC | PRN
  Administered 2022-12-11 (×3): 50 ug via INTRAVENOUS
  Administered 2022-12-11: 100 ug via INTRAVENOUS

## 2022-12-11 MED ORDER — ONDANSETRON HCL 4 MG/2ML IJ SOLN: 4.0000 mg | Freq: Four times a day (QID) | INTRAMUSCULAR | Status: DC | PRN

## 2022-12-11 MED ORDER — ROCURONIUM BROMIDE 10 MG/ML (PF) SYRINGE
PREFILLED_SYRINGE | INTRAVENOUS | Status: AC
  Filled 2022-12-11: qty 10

## 2022-12-11 MED ORDER — ARTIFICIAL TEARS OPHTHALMIC OINT
TOPICAL_OINTMENT | OPHTHALMIC | Status: AC
  Filled 2022-12-11: qty 3.5

## 2022-12-11 MED ORDER — PROMETHAZINE HCL 25 MG/ML IJ SOLN: 6.2500 mg | INTRAMUSCULAR | Status: DC | PRN

## 2022-12-11 MED ORDER — FENTANYL CITRATE (PF) 250 MCG/5ML IJ SOLN
INTRAMUSCULAR | Status: AC
  Filled 2022-12-11: qty 5

## 2022-12-11 MED ORDER — LIDOCAINE-EPINEPHRINE 1 %-1:100000 IJ SOLN
INTRAMUSCULAR | Status: DC | PRN
  Administered 2022-12-11: 10 mL

## 2022-12-11 MED ORDER — PHENOL 1.4 % MT LIQD: 1.0000 | OROMUCOSAL | Status: DC | PRN

## 2022-12-11 MED ORDER — THROMBIN 5000 UNITS EX SOLR
CUTANEOUS | Status: AC
  Filled 2022-12-11: qty 5000

## 2022-12-11 MED ORDER — LIDOCAINE-EPINEPHRINE 1 %-1:100000 IJ SOLN
INTRAMUSCULAR | Status: AC
  Filled 2022-12-11: qty 1

## 2022-12-11 MED ORDER — UBIQUINOL 100 MG PO CAPS: 100.0000 mg | ORAL_CAPSULE | Freq: Every day | ORAL | Status: DC

## 2022-12-11 MED ORDER — MIDAZOLAM HCL 2 MG/2ML IJ SOLN
INTRAMUSCULAR | Status: AC
  Filled 2022-12-11: qty 2

## 2022-12-11 MED ORDER — ETOMIDATE 2 MG/ML IV SOLN
INTRAVENOUS | Status: AC
  Filled 2022-12-11: qty 10

## 2022-12-11 MED ORDER — PHENYLEPHRINE HCL-NACL 20-0.9 MG/250ML-% IV SOLN
INTRAVENOUS | Status: DC | PRN
  Administered 2022-12-11: 40 ug/min via INTRAVENOUS

## 2022-12-11 MED ORDER — FENTANYL CITRATE (PF) 100 MCG/2ML IJ SOLN
25.0000 ug | INTRAMUSCULAR | Status: DC | PRN
  Administered 2022-12-11: 50 ug via INTRAVENOUS
  Administered 2022-12-11 (×2): 25 ug via INTRAVENOUS
  Administered 2022-12-11: 50 ug via INTRAVENOUS

## 2022-12-11 MED ORDER — ALBUMIN HUMAN 5 % IV SOLN: INTRAVENOUS | Status: DC | PRN

## 2022-12-11 MED ORDER — LOSARTAN POTASSIUM 50 MG PO TABS
25.0000 mg | ORAL_TABLET | Freq: Every day | ORAL | Status: DC
  Administered 2022-12-11: 25 mg via ORAL
  Filled 2022-12-11: qty 1

## 2022-12-11 MED ORDER — PANTOPRAZOLE SODIUM 40 MG PO TBEC
40.0000 mg | DELAYED_RELEASE_TABLET | Freq: Every day | ORAL | Status: DC
  Administered 2022-12-12: 40 mg via ORAL
  Filled 2022-12-11 (×2): qty 1

## 2022-12-11 MED ORDER — SODIUM CHLORIDE 0.9 % IV SOLN
250.0000 mL | INTRAVENOUS | Status: DC
  Administered 2022-12-11: 250 mL via INTRAVENOUS

## 2022-12-11 MED ORDER — HYDROMORPHONE HCL 1 MG/ML IJ SOLN: 1.0000 mg | INTRAMUSCULAR | Status: DC | PRN

## 2022-12-11 MED ORDER — ROCURONIUM BROMIDE 10 MG/ML (PF) SYRINGE
PREFILLED_SYRINGE | INTRAVENOUS | Status: DC | PRN
  Administered 2022-12-11: 10 mg via INTRAVENOUS
  Administered 2022-12-11: 20 mg via INTRAVENOUS
  Administered 2022-12-11: 70 mg via INTRAVENOUS

## 2022-12-11 MED ORDER — OXYCODONE HCL 5 MG PO TABS
10.0000 mg | ORAL_TABLET | ORAL | Status: DC | PRN
  Administered 2022-12-11 – 2022-12-12 (×5): 10 mg via ORAL
  Filled 2022-12-11 (×5): qty 2

## 2022-12-11 MED ORDER — PROPOFOL 10 MG/ML IV BOLUS
INTRAVENOUS | Status: AC
  Filled 2022-12-11: qty 20

## 2022-12-11 MED ORDER — DIPHENHYDRAMINE HCL 25 MG PO CAPS: 25.0000 mg | ORAL_CAPSULE | Freq: Four times a day (QID) | ORAL | Status: DC | PRN

## 2022-12-11 MED ORDER — METOPROLOL SUCCINATE ER 25 MG PO TB24
25.0000 mg | ORAL_TABLET | Freq: Every day | ORAL | Status: DC
  Administered 2022-12-12: 25 mg via ORAL
  Filled 2022-12-11: qty 1

## 2022-12-11 MED ORDER — FENTANYL CITRATE (PF) 100 MCG/2ML IJ SOLN
INTRAMUSCULAR | Status: AC
  Filled 2022-12-11: qty 2

## 2022-12-11 MED ORDER — ETOMIDATE 2 MG/ML IV SOLN
INTRAVENOUS | Status: DC | PRN
  Administered 2022-12-11: 20 mg via INTRAVENOUS

## 2022-12-11 MED ORDER — CHLORHEXIDINE GLUCONATE 0.12 % MT SOLN: 15.0000 mL | Freq: Once | OROMUCOSAL | Status: AC

## 2022-12-11 MED ORDER — EZETIMIBE 10 MG PO TABS
10.0000 mg | ORAL_TABLET | Freq: Every day | ORAL | Status: DC
  Administered 2022-12-11 (×2): 10 mg via ORAL
  Filled 2022-12-11: qty 1

## 2022-12-11 MED ORDER — PHENYLEPHRINE 80 MCG/ML (10ML) SYRINGE FOR IV PUSH (FOR BLOOD PRESSURE SUPPORT)
PREFILLED_SYRINGE | INTRAVENOUS | Status: DC | PRN
  Administered 2022-12-11 (×2): 80 ug via INTRAVENOUS

## 2022-12-11 MED ORDER — MIDAZOLAM HCL 2 MG/2ML IJ SOLN
INTRAMUSCULAR | Status: DC | PRN
  Administered 2022-12-11: 2 mg via INTRAVENOUS

## 2022-12-11 MED ORDER — CHLORHEXIDINE GLUCONATE 0.12 % MT SOLN
OROMUCOSAL | Status: AC
  Administered 2022-12-11: 15 mL via OROMUCOSAL
  Filled 2022-12-11: qty 15

## 2022-12-11 MED ORDER — SODIUM CHLORIDE 0.9% FLUSH: 3.0000 mL | INTRAVENOUS | Status: DC | PRN

## 2022-12-11 MED ORDER — ACETAMINOPHEN 650 MG RE SUPP: 650.0000 mg | RECTAL | Status: DC | PRN

## 2022-12-11 MED ORDER — GLYCOPYRROLATE PF 0.2 MG/ML IJ SOSY
PREFILLED_SYRINGE | INTRAMUSCULAR | Status: DC | PRN
  Administered 2022-12-11: .1 mg via INTRAVENOUS

## 2022-12-11 MED ORDER — AZELASTINE HCL 0.1 % NA SOLN
1.0000 | Freq: Two times a day (BID) | NASAL | Status: DC
  Administered 2022-12-11: 1 via NASAL
  Filled 2022-12-11: qty 30

## 2022-12-11 MED ORDER — MELATONIN 3 MG PO TABS: 3.0000 mg | ORAL_TABLET | Freq: Every day | ORAL | Status: DC

## 2022-12-11 MED ORDER — ONDANSETRON HCL 4 MG PO TABS: 4.0000 mg | ORAL_TABLET | Freq: Four times a day (QID) | ORAL | Status: DC | PRN

## 2022-12-11 MED ORDER — OXYCODONE HCL 5 MG PO TABS: 5.0000 mg | ORAL_TABLET | ORAL | Status: DC | PRN

## 2022-12-11 MED ORDER — LIDOCAINE 2% (20 MG/ML) 5 ML SYRINGE
INTRAMUSCULAR | Status: DC | PRN
  Administered 2022-12-11: 100 mg via INTRAVENOUS

## 2022-12-11 SURGICAL SUPPLY — 78 items
ADH SKN CLS APL DERMABOND .7 (GAUZE/BANDAGES/DRESSINGS) ×2
APL SKNCLS STERI-STRIP NONHPOA (GAUZE/BANDAGES/DRESSINGS)
BAG COUNTER SPONGE SURGICOUNT (BAG) ×2 IMPLANT
BAG SPNG CNTER NS LX DISP (BAG) ×2
BAND INSRT 18 STRL LF DISP RB (MISCELLANEOUS)
BAND RUBBER #18 3X1/16 STRL (MISCELLANEOUS) IMPLANT
BASKET BONE COLLECTION (BASKET) ×2 IMPLANT
BENZOIN TINCTURE PRP APPL 2/3 (GAUZE/BANDAGES/DRESSINGS) IMPLANT
BLADE CLIPPER SURG (BLADE) IMPLANT
BLADE SURG 11 STRL SS (BLADE) ×2 IMPLANT
BUR 14 MATCH 3 (BUR) IMPLANT
BUR MATCHSTICK NEURO 3.0 LAGG (BURR) ×2 IMPLANT
BUR MR8 14CM BALL SYMTRI 5 (BUR) IMPLANT
BUR PRECISION FLUTE 5.0 (BURR) ×2 IMPLANT
BURR 14 MATCH 3 (BUR) ×2
BURR MR8 14CM BALL SYMTRI 5 (BUR) ×2
CAGE INTERBODY PL SHT 7X22.5 (Plate) IMPLANT
CANISTER SUCT 3000ML PPV (MISCELLANEOUS) ×2 IMPLANT
CNTNR URN SCR LID CUP LEK RST (MISCELLANEOUS) ×2 IMPLANT
CONT SPEC 4OZ STRL OR WHT (MISCELLANEOUS) ×2
COVER BACK TABLE 60X90IN (DRAPES) ×2 IMPLANT
COVERAGE SUPPORT O-ARM STEALTH (MISCELLANEOUS) ×2 IMPLANT
DERMABOND ADVANCED .7 DNX12 (GAUZE/BANDAGES/DRESSINGS) ×2 IMPLANT
DRAPE C-ARM 42X72 X-RAY (DRAPES) IMPLANT
DRAPE C-ARMOR (DRAPES) IMPLANT
DRAPE LAPAROTOMY 100X72X124 (DRAPES) ×2 IMPLANT
DRAPE MICROSCOPE SLANT 54X150 (MISCELLANEOUS) IMPLANT
DRAPE SHEET LG 3/4 BI-LAMINATE (DRAPES) ×8 IMPLANT
DRAPE SURG 17X23 STRL (DRAPES) ×2 IMPLANT
DURAPREP 26ML APPLICATOR (WOUND CARE) ×2 IMPLANT
ELECT REM PT RETURN 9FT ADLT (ELECTROSURGICAL) ×2
ELECTRODE REM PT RTRN 9FT ADLT (ELECTROSURGICAL) ×2 IMPLANT
FEE COVERAGE SUPPORT O-ARM (MISCELLANEOUS) ×2 IMPLANT
GAUZE 4X4 16PLY ~~LOC~~+RFID DBL (SPONGE) IMPLANT
GAUZE SPONGE 4X4 12PLY STRL (GAUZE/BANDAGES/DRESSINGS) IMPLANT
GLOVE BIOGEL PI IND STRL 7.5 (GLOVE) ×4 IMPLANT
GLOVE ECLIPSE 7.5 STRL STRAW (GLOVE) ×4 IMPLANT
GLOVE EXAM NITRILE LRG STRL (GLOVE) IMPLANT
GLOVE EXAM NITRILE XL STR (GLOVE) IMPLANT
GLOVE EXAM NITRILE XS STR PU (GLOVE) IMPLANT
GOWN STRL REUS W/ TWL LRG LVL3 (GOWN DISPOSABLE) ×8 IMPLANT
GOWN STRL REUS W/ TWL XL LVL3 (GOWN DISPOSABLE) IMPLANT
GOWN STRL REUS W/TWL 2XL LVL3 (GOWN DISPOSABLE) IMPLANT
GOWN STRL REUS W/TWL LRG LVL3 (GOWN DISPOSABLE) ×8
GOWN STRL REUS W/TWL XL LVL3 (GOWN DISPOSABLE) ×4
HEMOSTAT POWDER KIT SURGIFOAM (HEMOSTASIS) ×2 IMPLANT
KIT BASIN OR (CUSTOM PROCEDURE TRAY) ×2 IMPLANT
KIT INFUSE SMALL (Orthopedic Implant) IMPLANT
KIT POSITION SURG JACKSON T1 (MISCELLANEOUS) ×2 IMPLANT
KIT TURNOVER KIT B (KITS) ×2 IMPLANT
MARKER SPHERE PSV REFLC NDI (MISCELLANEOUS) ×10 IMPLANT
MILL BONE PREP (MISCELLANEOUS) IMPLANT
NDL HYPO 18GX1.5 BLUNT FILL (NEEDLE) IMPLANT
NDL SPNL 18GX3.5 QUINCKE PK (NEEDLE) IMPLANT
NEEDLE HYPO 18GX1.5 BLUNT FILL (NEEDLE) IMPLANT
NEEDLE HYPO 22GX1.5 SAFETY (NEEDLE) ×2 IMPLANT
NEEDLE SPNL 18GX3.5 QUINCKE PK (NEEDLE) IMPLANT
NS IRRIG 1000ML POUR BTL (IV SOLUTION) ×2 IMPLANT
PACK LAMINECTOMY NEURO (CUSTOM PROCEDURE TRAY) ×2 IMPLANT
PAD ARMBOARD 7.5X6 YLW CONV (MISCELLANEOUS) ×6 IMPLANT
ROD SPINAL 5.5X35 CP 4 TI (Rod) IMPLANT
SCREW MAS/SET STERILE 4PK (Screw) IMPLANT
SCREW SHANK HORIZ NL 6.5X35 (Screw) IMPLANT
SOL ELECTROSURG ANTI STICK (MISCELLANEOUS) ×4
SOLUTION ELECTROSURG ANTI STCK (MISCELLANEOUS) ×4 IMPLANT
SPIKE FLUID TRANSFER (MISCELLANEOUS) ×2 IMPLANT
SPONGE SURGIFOAM ABS GEL 100 (HEMOSTASIS) IMPLANT
SPONGE T-LAP 4X18 ~~LOC~~+RFID (SPONGE) IMPLANT
STRIP CLOSURE SKIN 1/2X4 (GAUZE/BANDAGES/DRESSINGS) IMPLANT
SUT MNCRL AB 3-0 PS2 18 (SUTURE) ×2 IMPLANT
SUT VIC AB 0 CT1 18XCR BRD8 (SUTURE) ×2 IMPLANT
SUT VIC AB 0 CT1 8-18 (SUTURE) ×2
SUT VIC AB 2-0 CP2 18 (SUTURE) ×2 IMPLANT
SYR 3ML LL SCALE MARK (SYRINGE) IMPLANT
TOWEL GREEN STERILE (TOWEL DISPOSABLE) ×2 IMPLANT
TOWEL GREEN STERILE FF (TOWEL DISPOSABLE) ×2 IMPLANT
TRAY FOLEY MTR SLVR 16FR STAT (SET/KITS/TRAYS/PACK) ×2 IMPLANT
WATER STERILE IRR 1000ML POUR (IV SOLUTION) ×2 IMPLANT

## 2022-12-11 NOTE — Anesthesia Procedure Notes (Signed)
Arterial Line Insertion Start/End4/22/2024 7:25 AM, 12/11/2022 7:30 AM Performed by: [facility] CATH PROC RM 1, anesthesiologist  Patient location: Pre-op. Preanesthetic checklist: patient identified, IV checked, site marked, risks and benefits discussed, surgical consent, monitors and equipment checked, pre-op evaluation, timeout performed and anesthesia consent Lidocaine 1% used for infiltration Left, radial was placed Catheter size: 20 G Hand hygiene performed  and maximum sterile barriers used   Attempts: 1 Procedure performed using ultrasound guided technique. Ultrasound Notes:anatomy identified, needle tip was noted to be adjacent to the nerve/plexus identified, no ultrasound evidence of intravascular and/or intraneural injection and image(s) printed for medical record Following insertion, dressing applied and Biopatch. Post procedure assessment: normal and unchanged  Post procedure complications: second provider assisted and unsuccessful attempts. Patient tolerated the procedure well with no immediate complications.

## 2022-12-11 NOTE — H&P (Signed)
Surgical H&P Update  HPI: 67 y.o. with a history of back and BLE pain, workup showed a grade 2 spondy. Surgery aborted during first attempt due to asystole during induction, cardiac workup was negative and cleared for repeat attempt. No changes in health since they were last seen. Still having the above and wishes to proceed with surgery.  PMHx:  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   Breast cancer 2009   BRACA 1 Pos   Chronic midline low back pain without sciatica 02/18/2020   Coronary artery disease    mild   COVID-19 10/20/2022   Family history of adverse reaction to anesthesia    mother is slow to wake up   Fibromyalgia    GERD (gastroesophageal reflux disease) 02/18/2020   Hyperlipemia    Hypertension    Mitral valve prolapse    never has caused any problems per patient 12/04/22   Multiple drug allergies 02/18/2020   Myalgia due to statin 02/18/2020   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   MRI 05/2018   Osteopenia after menopause 02/11/2021   DEXA 01/2021 lowest T = -1.7 femur; recheck 2-3 years   Pneumonia    x 1 - years ago   PONV (postoperative nausea and vomiting)    after most recent colonoscopy (2020)   FamHx:  Family History  Problem Relation Age of Onset   Hypertension Mother    Colon polyps Mother    Gallstones Mother    Stroke Father    Hypertension Brother    Gallstones Brother    Hypertension Brother    Gallstones Brother    Hypertension Brother    Gallstones Brother    SocHx:  reports that she has never smoked. She has been exposed to tobacco smoke. She has never used smokeless tobacco. She reports current alcohol use. She reports that she does not use drugs.  Physical Exam: Strength 5/5 x4 and SILTx4   Assesment/Plan: 67 y.o. woman with grade 2 mobile spondy at L5-S1 with claudication and LBP, here for 2nd attempt at L5-S1 TLIF/PLF. Risks, benefits, and alternatives discussed and the  patient would like to continue with surgery.  -OR today -3C post-op pending intra-op course  Jadene Pierini, MD 12/11/22 7:33 AM

## 2022-12-11 NOTE — Op Note (Addendum)
PATIENT: Shannon Osborne  DAY OF SURGERY: 12/11/22   PRE-OPERATIVE DIAGNOSIS:  Lumbar spondylolisthesis, lumbar stenosis with neurogenic claudication   POST-OPERATIVE DIAGNOSIS:  Same   PROCEDURE:  L5-S1 open decompressive laminectomy, L5-S1 transforaminal lumbar interbody fusion and posterolateral instrumented fusion, use of intraoperative CT with frameless stereotactic navigation   SURGEON:  Surgeon(s) and Role:    Jadene Pierini, MD    Emilee Hero PA   ANESTHESIA: ETGA   BRIEF HISTORY: This is a 67 year old woman who presented with back and bilateral lower extremity pain with claudication. Workup showed L5-S1 mobile spondylolisthesis, grade 2 when standing, with central canal stenosis and foraminal stenosis. She was previously taken to the OR for an aborted attempt due to asystole on induction, cardiac workup was negative and she was cleared for a second attempt. She presents today for second attempt.    OPERATIVE DETAIL: The patient was taken to the operating room and anesthesia was induced by the anesthesia team. They were placed on the OR table in the prone position with padding of all pressure points. A formal time out was performed with two patient identifiers and confirmed the operative site. The operative site was marked, hair was clipped with surgical clippers, the area was then prepped and draped in a sterile fashion. A midline incision was placed to expose from L5 to S1. Given her transitional anatomy, to be clear, I will refer to L5-S1 as the level with the clear spondylolisthesis. Subperiosteal dissection was performed bilaterally.   A spinous process clamp was applied and secured, followed by attachment of a reference array. The field was draped and the O-arm was brought into the field. An intra-op CT was performed, sent to the Stealth navigation station, registered to the patient's anatomy, and confirmed with landmarks with acceptable fit. Stereotactic spinal navigation  was utilized throughout the procedure for planning and placement of pedicle screw trajectories. The lateral images from the O-arm were used to confirm the surgical level.  Instrumentation was then performed. Frameless stereotaxy was used to guide placement of bilateral pedicle screws (Medtronic) at L5 and S1. These were placed with navigated instruments by localizing the pedicle, drilling a pilot hole, cannulating the pedicle with an awl-tap, palpating for pedicle wall breaches, and then placing the screw.   Decompression was performed, which consisted of decompressive laminectomy at L5-S1 performed with a combination of high speed drill and kerrison rongeurs. A complete facetectomy was then performed to decompress the foramen as well as allow access for the TLIF. The exiting root was identified and protected for the entirety of disc space work. A complete discectomy was performed and the endplates were prepared. Navigation was used to guide placement of an expandable interbody. It was expanded, back-filled with autograft, and deployed.  These were connected with rods bilaterally and final tightened according to manufacturer torque specifications. The bone was thoroughly decorticated over the fusion surface and the previously resected bone fragments were morselized and used as autograft.   Alli Consentino PA was scrubbed and assisted with the entire procedure which included exposure and instrumentation.  All instrument and sponge counts were correct, the incision was then closed in layers. The patient was then returned to anesthesia for emergence. No apparent complications at the completion of the procedure.   EBL:    DRAINS: none   SPECIMENS: none   Jadene Pierini, MD

## 2022-12-11 NOTE — Anesthesia Postprocedure Evaluation (Signed)
Anesthesia Post Note  Patient: Shannon Osborne  Procedure(s) Performed: Lumbar five-Sacral one Laminectomy, Transforaminal Lumbar Interbody Fusion, posterolateral instrumented fusion (Spine Lumbar) Application of O-Arm     Patient location during evaluation: PACU Anesthesia Type: General Level of consciousness: sedated Pain management: pain level controlled Vital Signs Assessment: post-procedure vital signs reviewed and stable Respiratory status: spontaneous breathing and respiratory function stable Cardiovascular status: stable Postop Assessment: no apparent nausea or vomiting Anesthetic complications: no   There were no known notable events for this encounter.  Last Vitals:  Vitals:   12/11/22 1200 12/11/22 1215  BP: 137/76 (!) 153/91  Pulse: 66 76  Resp: 12 14  Temp:    SpO2: 96% 98%    Last Pain:  Vitals:   12/11/22 1215  TempSrc:   PainSc: 3                  Hideo Googe DANIEL

## 2022-12-11 NOTE — Transfer of Care (Signed)
Immediate Anesthesia Transfer of Care Note  Patient: Shannon Osborne  Procedure(s) Performed: Lumbar five-Sacral one Laminectomy, Transforaminal Lumbar Interbody Fusion, posterolateral instrumented fusion (Spine Lumbar) Application of O-Arm  Patient Location: PACU  Anesthesia Type:General  Level of Consciousness: awake, alert , and oriented  Airway & Oxygen Therapy: Patient connected to face mask oxygen  Post-op Assessment: Report given to RN and Post -op Vital signs reviewed and stable  Post vital signs: Reviewed and stable  Last Vitals:  Vitals Value Taken Time  BP 140/69 12/11/22 1047  Temp    Pulse 84 12/11/22 1049  Resp 14 12/11/22 1049  SpO2 100 % 12/11/22 1049  Vitals shown include unvalidated device data.  Last Pain:  Vitals:   12/11/22 0606  TempSrc:   PainSc: 3          Complications: There were no known notable events for this encounter.

## 2022-12-11 NOTE — Telephone Encounter (Signed)
Pharmacy Patient Advocate Encounter  Prior Authorization for REPATHA has been approved.    Effective dates: 11/08/22 through 12/08/23  Haze Rushing, CPhT Pharmacy Patient Advocate Specialist Direct Number: [phone] Fax: [phone]

## 2022-12-11 NOTE — Anesthesia Procedure Notes (Addendum)

## 2022-12-12 ENCOUNTER — Encounter (HOSPITAL_COMMUNITY): Payer: Self-pay | Admitting: Neurological Surgery

## 2022-12-12 DIAGNOSIS — M4316 Spondylolisthesis, lumbar region: Secondary | ICD-10-CM | POA: Diagnosis not present

## 2022-12-12 MED ORDER — CARISOPRODOL 350 MG PO TABS: 350.0000 mg | ORAL_TABLET | Freq: Three times a day (TID) | ORAL | 0 refills | Status: AC | PRN

## 2022-12-12 MED ORDER — OXYCODONE HCL 10 MG PO TABS: 10.0000 mg | ORAL_TABLET | ORAL | 0 refills | Status: DC | PRN

## 2022-12-12 NOTE — Discharge Summary (Signed)
Discharge Summary  Date of Admission: 12/11/2022  Date of Discharge: 12/12/22  Attending Physician: Autumn Patty, MD  Hospital Course: Patient was admitted following an uncomplicated L5-S1 laminectomy and TLIF / PSF. They were recovered in PACU and transferred to [facility]. Their preop symptoms were improved, their hospital course was uncomplicated and the patient was discharged home today. They will follow up in clinic with me in clinic in 2 weeks.  Neurologic exam at discharge:  Strength 5/5 x4 and SILTx4, incision c/d/I   Discharge diagnosis: lumbar spondylolisthesis with radiculopathy   Iran Sizer, PA-C 12/12/22 10:10 AM

## 2022-12-12 NOTE — Evaluation (Signed)
Physical Therapy Evaluation  Patient Details Name: Shannon Osborne MRN: [id number] DOB: 1956/06/14 Today's Date: 12/12/2022  History of Present Illness  Pt is a 67 y/o F s/p L5-S1 open decompressive laminectomy, L5-S1 transforaminal lumbar interbody fusion and posterolateral instrumented fusion. PMH includes anemia, CAD, COVID-19, fibromyalgia, GERD, HLD, HTN, mitral valve prolapse   Clinical Impression  Pt admitted with above diagnosis. At the time of PT eval, pt was able to demonstrate transfers and ambulation with gross supervision for safety and no AD. Pt was educated on precautions, positioning recommendations, appropriate activity progression, and car transfer. Pt currently with functional limitations due to the deficits listed below (see PT Problem List). Pt will benefit from skilled PT to increase their independence and safety with mobility to allow discharge to the venue listed below.         Recommendations for follow up therapy are one component of a multi-disciplinary discharge planning process, led by the attending physician.  Recommendations may be updated based on patient status, additional functional criteria and insurance authorization.  Follow Up Recommendations       Assistance Recommended at Discharge PRN  Patient can return home with the following  A little help with walking and/or transfers;A little help with bathing/dressing/bathroom;Assistance with cooking/housework;Assist for transportation;Help with stairs or ramp for entrance    Equipment Recommendations None recommended by PT  Recommendations for Other Services       Functional Status Assessment Patient has had a recent decline in their functional status and demonstrates the ability to make significant improvements in function in a reasonable and predictable amount of time.     Precautions / Restrictions Precautions Precautions: Back Precaution Booklet Issued: Yes (comment) Precaution Comments: Reviewed handout  and pt was cued for precautions during functional mobility. Required Braces or Orthoses: Other Brace Other Brace: no brace needed per MD order set Restrictions Weight Bearing Restrictions: No      Mobility  Bed Mobility Overal bed mobility: Needs Assistance Bed Mobility: Rolling, Sidelying to Sit Rolling: Supervision Sidelying to sit: Supervision       General bed mobility comments: HOB flat and rails lowered to simulate home environment.    Transfers Overall transfer level: Needs assistance Equipment used: None Transfers: Sit to/from Stand Sit to Stand: Supervision           General transfer comment: VC's for improved posture and hand placement on seated surface for safety.    Ambulation/Gait Ambulation/Gait assistance: Supervision Gait Distance (Feet): 400 Feet Assistive device: None Gait Pattern/deviations: Step-through pattern, Decreased stride length, Trunk flexed Gait velocity: Decreased Gait velocity interpretation: 1.31 - 2.62 ft/sec, indicative of limited community ambulator   General Gait Details: VC's for improved posture. No assist required and no overt LOB noted.  Stairs Stairs: Yes Stairs assistance: Min guard Stair Management: One rail Right, Alternating pattern, Forwards Number of Stairs: 5 General stair comments: VC's for sequencing and general safety.  Wheelchair Mobility    Modified Rankin (Stroke Patients Only)       Balance Overall balance assessment: No apparent balance deficits (not formally assessed)                                           Pertinent Vitals/Pain Pain Assessment Pain Assessment: Faces Faces Pain Scale: Hurts little more Pain Location: back with bed mobility Pain Descriptors / Indicators: Discomfort Pain Intervention(s): Limited activity within patient's tolerance, Monitored  during session, Repositioned    Home Living Family/patient expects to be discharged to:: Private residence Living  Arrangements: Spouse/significant other Available Help at Discharge: Family;Available 24 hours/day Type of Home: House Home Access: Stairs to enter Entrance Stairs-Rails: Can reach both Entrance Stairs-Number of Steps: 4-5   Home Layout: Two level;Able to live on main level with bedroom/bathroom Home Equipment: Rolling Walker (2 wheels);Shower seat;Cane - quad;Other (comment) (knee scooter)      Prior Function Prior Level of Function : Independent/Modified Independent             Mobility Comments: no AD use ADLs Comments: ind     Hand Dominance        Extremity/Trunk Assessment   Upper Extremity Assessment Upper Extremity Assessment: Defer to OT evaluation    Lower Extremity Assessment Lower Extremity Assessment: Generalized weakness (Mild; consistent with pre-op diagnosis.)    Cervical / Trunk Assessment Cervical / Trunk Assessment: Back Surgery  Communication   Communication: No difficulties  Cognition Arousal/Alertness: Awake/alert Behavior During Therapy: WFL for tasks assessed/performed Overall Cognitive Status: Within Functional Limits for tasks assessed                                          General Comments General comments (skin integrity, edema, etc.): VSS on RA, family present    Exercises     Assessment/Plan    PT Assessment Patient needs continued PT services  PT Problem List Decreased strength;Decreased activity tolerance;Decreased balance;Decreased mobility;Decreased knowledge of use of DME;Decreased safety awareness;Decreased knowledge of precautions;Pain       PT Treatment Interventions DME instruction;Gait training;Stair training;Functional mobility training;Therapeutic activities;Therapeutic exercise;Balance training;Patient/family education    PT Goals (Current goals can be found in the Care Plan section)  Acute Rehab PT Goals Patient Stated Goal: Home today PT Goal Formulation: With patient/family Time For Goal  Achievement: 12/19/22 Potential to Achieve Goals: Good    Frequency Min 5X/week     Co-evaluation               AM-PAC PT "6 Clicks" Mobility  Outcome Measure Help needed turning from your back to your side while in a flat bed without using bedrails?: A Little Help needed moving from lying on your back to sitting on the side of a flat bed without using bedrails?: A Little Help needed moving to and from a bed to a chair (including a wheelchair)?: A Little Help needed standing up from a chair using your arms (e.g., wheelchair or bedside chair)?: A Little Help needed to walk in hospital room?: A Little Help needed climbing 3-5 steps with a railing? : A Little 6 Click Score: 18    End of Session Equipment Utilized During Treatment: Gait belt Activity Tolerance: Patient tolerated treatment well Patient left: in bed;with call bell/phone within reach;with family/visitor present Nurse Communication: Mobility status PT Visit Diagnosis: Unsteadiness on feet (R26.81);Pain Pain - part of body:  (back)    Time: 4098-1191 PT Time Calculation (min) (ACUTE ONLY): 27 min   Charges:   PT Evaluation $PT Eval Low Complexity: 1 Low PT Treatments $Gait Training: 8-22 mins        Conni Slipper, PT, DPT Acute Rehabilitation Services Secure Chat Preferred Office: [phone]   Marylynn Pearson 12/12/2022, 9:49 AM

## 2022-12-12 NOTE — Care Management CC44 (Signed)
Condition Code 44 Documentation Completed  Patient Details  Name: Shannon Osborne MRN: [id number] Date of Birth: 1956-06-23   Condition Code 44 given:  Yes Patient signature on Condition Code 44 notice:  Yes Documentation of 2 MD's agreement:  Yes Code 44 added to claim:  Yes    Kermit Balo, RN 12/12/2022, 10:16 AM

## 2022-12-12 NOTE — Care Management Obs Status (Signed)
MEDICARE OBSERVATION STATUS NOTIFICATION   Patient Details  Name: Shannon Osborne MRN: [id number] Date of Birth: 02-17-1956   Medicare Observation Status Notification Given:  Yes    Kermit Balo, RN 12/12/2022, 10:16 AM

## 2022-12-12 NOTE — Evaluation (Signed)
Occupational Therapy Evaluation Patient Details Name: Shannon Osborne MRN: [id number] DOB: 06-06-1956 Today's Date: 12/12/2022   History of Present Illness Pt is a 67 y/o F s/p L5-S1 open decompressive laminectomy, L5-S1 transforaminal lumbar interbody fusion and posterolateral instrumented fusion. PMH includes anemia, CAD, COVID-19, fibromyalgia, GERD, HLD, HTN, mitral valve prolapse   Clinical Impression   Pt reports independence at baseline with ADLs and functional mobility, lives with spouse who can assist 24/7 at d/c. Pt currently needing set up - supervision for ADLs, bed mobility, and transfers without AD. Pt educated on back precautions (handout provided) along with compensatory strategies for ADLs/functional mobility. Pt and spouse verbalized understanding. Pt presenting with impairments listed below, however has no acute OT needs at this time, will s/o. Please reconsult if there is a change in pt status.     Recommendations for follow up therapy are one component of a multi-disciplinary discharge planning process, led by the attending physician.  Recommendations may be updated based on patient status, additional functional criteria and insurance authorization.   Assistance Recommended at Discharge Set up Supervision/Assistance  Patient can return home with the following Assistance with cooking/housework;Assist for transportation;Help with stairs or ramp for entrance;A little help with bathing/dressing/bathroom    Functional Status Assessment  Patient has had a recent decline in their functional status and demonstrates the ability to make significant improvements in function in a reasonable and predictable amount of time.  Equipment Recommendations  None recommended by OT    Recommendations for Other Services PT consult     Precautions / Restrictions Precautions Precautions: Back Precaution Booklet Issued: Yes (comment) Precaution Comments: educated pt on 3/3 back  precautions Required Braces or Orthoses: Other Brace Other Brace: no brace needed per MD order set Restrictions Weight Bearing Restrictions: No      Mobility Bed Mobility Overal bed mobility: Needs Assistance Bed Mobility: Rolling, Sidelying to Sit Rolling: Supervision Sidelying to sit: Supervision       General bed mobility comments: use of log roll technique    Transfers Overall transfer level: Needs assistance Equipment used: None Transfers: Sit to/from Stand Sit to Stand: Supervision                  Balance Overall balance assessment: No apparent balance deficits (not formally assessed)                                         ADL either performed or assessed with clinical judgement   ADL Overall ADL's : Needs assistance/impaired Eating/Feeding: Set up;Sitting   Grooming: Set up;Sitting;Standing   Upper Body Bathing: Supervision/ safety;Sitting   Lower Body Bathing: Supervison/ safety;Sit to/from stand   Upper Body Dressing : Supervision/safety;Sitting   Lower Body Dressing: Supervision/safety;Sit to/from stand   Toilet Transfer: Supervision/safety;Ambulation;Regular Social worker and Hygiene: Supervision/safety   Tub/ Engineer, structural: Supervision/safety;Walk-in shower   Functional mobility during ADLs: Supervision/safety       Vision Baseline Vision/History: 1 Wears glasses Vision Assessment?: No apparent visual deficits     Perception Perception Perception Tested?: No   Praxis Praxis Praxis tested?: Not tested    Pertinent Vitals/Pain Pain Assessment Pain Assessment: Faces Pain Score: 2  Faces Pain Scale: Hurts little more Pain Location: back with bed mobility Pain Descriptors / Indicators: Discomfort Pain Intervention(s): Limited activity within patient's tolerance, Monitored during session, Repositioned     Hand Dominance  Extremity/Trunk Assessment Upper Extremity  Assessment Upper Extremity Assessment: Overall WFL for tasks assessed   Lower Extremity Assessment Lower Extremity Assessment: Defer to PT evaluation   Cervical / Trunk Assessment Cervical / Trunk Assessment: Back Surgery   Communication Communication Communication: No difficulties   Cognition Arousal/Alertness: Awake/alert Behavior During Therapy: WFL for tasks assessed/performed Overall Cognitive Status: Within Functional Limits for tasks assessed                                       General Comments  VSS on RA, family present    Exercises     Shoulder Instructions      Home Living Family/patient expects to be discharged to:: Private residence Living Arrangements: Spouse/significant other Available Help at Discharge: Family;Available 24 hours/day Type of Home: House Home Access: Stairs to enter [employer] of Steps: 4-5 Entrance Stairs-Rails: Can reach both Home Layout: Two level;Able to live on main level with bedroom/bathroom     Bathroom Shower/Tub: Producer, television/film/video: Handicapped height Bathroom Accessibility: Yes   Home Equipment: Agricultural consultant (2 wheels);Shower seat;Cane - quad;Other (comment) (knee scooter)          Prior Functioning/Environment Prior Level of Function : Independent/Modified Independent             Mobility Comments: no AD use ADLs Comments: ind        OT Problem List: Decreased strength;Decreased activity tolerance;Decreased range of motion      OT Treatment/Interventions:      OT Goals(Current goals can be found in the care plan section) Acute Rehab OT Goals Patient Stated Goal: none stated OT Goal Formulation: With patient Time For Goal Achievement: 12/26/22 Potential to Achieve Goals: Good  OT Frequency:      Co-evaluation              AM-PAC OT "6 Clicks" Daily Activity     Outcome Measure Help from another person eating meals?: None Help from another person  taking care of personal grooming?: None Help from another person toileting, which includes using toliet, bedpan, or urinal?: None Help from another person bathing (including washing, rinsing, drying)?: A Little Help from another person to put on and taking off regular upper body clothing?: None Help from another person to put on and taking off regular lower body clothing?: A Little 6 Click Score: 22   End of Session Nurse Communication: Mobility status  Activity Tolerance: Patient tolerated treatment well Patient left: in bed;with family/visitor present;with call bell/phone within reach (seated EOB)  OT Visit Diagnosis: Muscle weakness (generalized) (M62.81)                Time: 1610-9604 OT Time Calculation (min): 29 min Charges:  OT General Charges $OT Visit: 1 Visit OT Evaluation $OT Eval Low Complexity: 1 Low OT Treatments $Self Care/Home Management : 8-22 mins  Carver Fila, OTD, OTR/L SecureChat Preferred Acute Rehab [phone]  Carver Fila Koonce 12/12/2022, 8:39 AM

## 2022-12-12 NOTE — Progress Notes (Signed)
Neurosurgery Service Progress Note  Subjective: No acute events overnight. Pre-op LE radicular pain has completely resolved. Back pain as expected. Overall feeling significantly better.    Objective: Vitals:   12/12/22 0021 12/12/22 0507 12/12/22 0723 12/12/22 0725  BP: 110/68 114/71  121/65  Pulse: 83 94 83 81  Resp: Temp: 98 F (36.7 C) 98.1 F (36.7 C)  98 F (36.7 C)  TempSrc: Oral Oral  Oral  SpO2: 96% 99% 98% 100%  Weight:      Height:        Physical Exam: Strength 5/5 x4 and SILTx4, incision c/d/I   Assessment & Plan: 67 y.o. female s/p L5-S1 laminectomy and TLIF / PSF, recovering well.  -PT/OT -discharge home today   Emilee Hero, PA-C 12/12/22 10:09 AM

## 2022-12-12 NOTE — Progress Notes (Signed)
Patient alert and oriented, mae's well, voiding adequate amount of urine, swallowing without difficulty, no c/o pain at time of discharge. Patient discharged home with family. Script and discharged instructions given to patient. Patient and significant order stated understanding of instructions given. Patient has an appointment with Dr. Maurice Small

## 2022-12-12 NOTE — Discharge Instructions (Signed)
  Call Your Doctor If Any of These Occur Redness, drainage, or swelling at the wound.  Temperature greater than 101 degrees. Severe pain not relieved by pain medication. Incision starts to come apart.   

## 2022-12-29 ENCOUNTER — Encounter (HOSPITAL_COMMUNITY): Payer: Self-pay

## 2022-12-29 ENCOUNTER — Ambulatory Visit (HOSPITAL_COMMUNITY)
Admission: RE | Admit: 2022-12-29 | Discharge: 2022-12-29 | Disposition: A | Discharge status: Home / Self Care | Payer: Medicare Other | Source: Ambulatory Visit | Attending: Family Medicine | Admitting: Family Medicine

## 2022-12-29 VITALS — BP 126/84 | HR 89 | Temp 98.0°F | Resp 18

## 2022-12-29 DIAGNOSIS — L2489 Irritant contact dermatitis due to other agents: Secondary | ICD-10-CM

## 2022-12-29 DIAGNOSIS — R239 Unspecified skin changes: Secondary | ICD-10-CM

## 2022-12-29 DIAGNOSIS — T888XXA Other specified complications of surgical and medical care, not elsewhere classified, initial encounter: Secondary | ICD-10-CM

## 2022-12-29 MED ORDER — PREDNISONE 10 MG PO TABS: 10.0000 mg | ORAL_TABLET | Freq: Every day | ORAL | 0 refills | Status: AC

## 2022-12-29 MED ORDER — TRIAMCINOLONE ACETONIDE 0.1 % EX CREA: 1.0000 | TOPICAL_CREAM | Freq: Two times a day (BID) | CUTANEOUS | 0 refills | Status: AC | PRN

## 2022-12-29 MED ORDER — DEXAMETHASONE SODIUM PHOSPHATE 10 MG/ML IJ SOLN
INTRAMUSCULAR | Status: AC
  Filled 2022-12-29: qty 1

## 2022-12-29 MED ORDER — DEXAMETHASONE SODIUM PHOSPHATE 10 MG/ML IJ SOLN
10.0000 mg | Freq: Once | INTRAMUSCULAR | Status: AC
  Administered 2022-12-29: 10 mg via INTRAMUSCULAR

## 2022-12-29 NOTE — ED Triage Notes (Signed)
Patient had back surgery on 12/11/22. States a week after that there was about a 1 inch border around the incision that has been itching, not the incision itself. Is now spreading to the waistband and back.   States there is burning, itching, and a bumpy rash. Area is tender to the touch.

## 2022-12-29 NOTE — ED Provider Notes (Signed)
[facility]    CSN: [id number] Arrival date & time: 12/29/22  1403      History   Chief Complaint Chief Complaint  Patient presents with   Appointment    I believe it's a reaction to the glue used in my surgery. It burns/stings and is crawling out from its original location. - Entered by patient    HPI Shannon Osborne is a 67 y.o. adult.   HPI Patient presents today for evaluation of a suspected allergic reaction from surgical adhesive.  Patient had back surgery on December 11, 2022, and reports one week after she noticed a small area of itching.  The area has since grown in diameter and she is able to see a bumpy like rash which is now extending down towards her buttocks and underneath her bra line.  Reports prior allergic reactions to bandages with adhesion however was unaware that she could possibly have reaction to surgical glue.  Attempted relief with multiple over-the-counter topical medications without any relief.  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   MRI 05/2018   Osteopenia after menopause 02/11/2021   DEXA 01/2021 lowest T = -1.7 femur; recheck 2-3 years   Pneumonia    x 1 - years ago   PONV (postoperative nausea and vomiting)    after most recent colonoscopy (2020)    Patient Active Problem List   Diagnosis Date Noted   Asystole (HCC) 08/30/2022   Nonobstructive  atherosclerosis of coronary artery 08/30/2022   Spondylolisthesis of lumbar region 08/29/2022   History of asbestos exposure 03/02/2022   Mild mitral regurgitation 03/02/2022   Atherosclerosis of aorta (HCC) 03/02/2022   Agatston coronary artery calcium score between 200 and 399 03/02/2022   Essential hypertension 07/07/2021   Osteopenia after menopause 02/11/2021   Long-term current use of proton pump inhibitor therapy 11/29/2020   Postherpetic neuralgia 04/28/2020   History of breast cancer 02/18/2020   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   GERD (gastroesophageal reflux disease) 02/18/2020   Myalgia due to statin 02/18/2020   Mixed hyperlipidemia 02/18/2020   Colon polyp 02/18/2020   Multiple drug allergies 02/18/2020   Status post bilateral mastectomy 02/18/2020   Chronic midline low back pain without sciatica 02/18/2020   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   Post-traumatic arthritis of right foot 01/16/2020   Status post shoulder replacement, 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    Past Surgical History:  Procedure Laterality Date   ABDOMINAL HYSTERECTOMY  2002   BREAST SURGERY     right 09/10/2007, left 06/16/2009   colonosocpy     several   FOOT BONE EXCISION Right 2021   at [facility]  - fusion heel bone   SHOULDER ARTHROTOMY Left 2013   and 06/2015   TONSILLECTOMY  2012   TRANSFORAMINAL LUMBAR  INTERBODY FUSION (TLIF) WITH PEDICLE SCREW FIXATION 1 LEVEL N/A 12/11/2022   Procedure: Lumbar five-Sacral one Laminectomy, Transforaminal Lumbar Interbody Fusion, posterolateral instrumented fusion;  Surgeon: Jadene Pierini, MD;  Location: [facility] OR;  Service: Neurosurgery;  Laterality: N/A;   UPPER GI ENDOSCOPY      OB History   No obstetric history on file.      Home Medications    Prior to Admission medications   Medication Sig Start Date End Date Taking? Authorizing Provider  aspirin EC 81 MG tablet Take 1 tablet (81 mg total) by mouth  daily. Swallow whole. 12/15/22  Yes Cosentino, Talmadge Chad, PA-C  carisoprodol (SOMA) 350 MG tablet Take 1 tablet (350 mg total) by mouth 3 (three) times daily as needed for muscle spasms. 12/12/22  Yes Cosentino, Talmadge Chad, PA-C  cholecalciferol (VITAMIN D3) 25 MCG (1000 UNIT) tablet Take 1,000 Units by mouth 2 (two) times daily.   Yes [provider]  Evolocumab (REPATHA SURECLICK) 140 MG/ML SOAJ Inject 140 mg into the skin every 14 (fourteen) days. 09/04/22  Yes Meriam Sprague, MD  ezetimibe (ZETIA) 10 MG tablet TAKE 1 TABLET AT BEDTIME 07/24/22  Yes Willow Ora, MD  icosapent Ethyl (VASCEPA) 1 g capsule Take 2 capsules (2 g total) by mouth 2 (two) times daily. 10/23/22  Yes Meriam Sprague, MD  losartan (COZAAR) 25 MG tablet Take 1 tablet (25 mg total) by mouth daily. Patient taking differently: Take 25 mg by mouth every evening. 09/06/22  Yes Wallis Bamberg C, NP  melatonin 3 MG TABS tablet Take 3 mg by mouth at bedtime.   Yes [provider]  metoprolol succinate (TOPROL-XL) 25 MG 24 hr tablet TAKE 1 TABLET BY MOUTH EVERY DAY 12/05/22  Yes Willow Ora, MD  Multiple Vitamin (MULTIVITAMIN WITH MINERALS) TABS tablet Take 1 tablet by mouth daily.   Yes [provider]  omeprazole (PRILOSEC) 20 MG capsule Take 1 capsule (20 mg total) by mouth daily as needed (acid reflux). Patient taking differently: Take 20 mg by mouth daily. 09/11/22  Yes Willow Ora, MD  predniSONE (DELTASONE) 10 MG tablet Take 1 tablet (10 mg total) by mouth daily with breakfast for 5 days. 12/29/22 01/03/23 Yes Bing Neighbors, NP  triamcinolone cream (KENALOG) 0.1 % Apply 1 Application topically 2 (two) times daily as needed. 12/29/22  Yes Bing Neighbors, NP  acetaminophen (TYLENOL) 500 MG tablet Take 1,000 mg by mouth every 6 (six) hours as needed for moderate pain.    [provider]  azelastine (ASTELIN) 0.1 % nasal spray Place 1 spray into both nostrils 2 (two) times  daily. Use in each nostril as directed    [provider]  brompheniramine-pseudoephedrine-DM 30-2-10 MG/5ML syrup Take 5 mLs by mouth every 4 (four) hours as needed (cough & congestion). 10/29/22   Lurline Idol, FNP  fluticasone (FLONASE SENSIMIST) 27.5 MCG/SPRAY nasal spray Place 2 sprays into the nose daily as needed for rhinitis or allergies.    [provider]  Omega-3 Fatty Acids (OMEGA 3 PO) Take 1 capsule by mouth daily.    [provider]  oxyCODONE 10 MG TABS Take 1 tablet (10 mg total) by mouth every 4 (four) hours as needed for severe pain ((score 7 to 10)). 12/12/22   Iran Sizer, PA-C  traZODone (DESYREL) 50 MG tablet Take 50 mg by mouth at bedtime as needed for sleep.    [provider]  Ubiquinol 100 MG CAPS Take  100 mg by mouth daily.    [provider]    Family History Family History  Problem Relation Age of Onset   Hypertension Mother    Colon polyps Mother    Gallstones Mother    Stroke Father    Hypertension Brother    Gallstones Brother    Hypertension Brother    Gallstones Brother    Hypertension Brother    Gallstones Brother     Social History Social History   Tobacco Use   Smoking status: Never    Passive exposure: Past   Smokeless tobacco: Never  Vaping Use   Vaping Use: Never used  Substance Use Topics   Alcohol use: Yes    Comment: 10 drinks a year    Drug use: Never     Allergies   Baclofen, Barley grass, Buckwheat, Cyclobenzaprine, Escitalopram oxalate, Iodine, Lyrica [pregabalin], Other, Rosuvastatin, Sertraline, Simvastatin, Vanilla, Atorvastatin, Codeine, Contrast media [iodinated contrast media], Hydrochlorothiazide w-triamterene, Hydrocodone-acetaminophen, Maxzide [triamterene-hctz], and Oxycodone hcl   Review of Systems Review of Systems Pertinent negatives listed in HPI   Physical Exam Triage Vital Signs ED Triage Vitals  Enc Vitals Group     BP 12/29/22 1420 126/84      Pulse Rate 12/29/22 1420 89     Resp 12/29/22 1420 18     Temp 12/29/22 1420 98 F (36.7 C)     Temp Source 12/29/22 1420 Oral     SpO2 12/29/22 1420 95 %     Weight --      Height --      Head Circumference --      Peak Flow --      Pain Score 12/29/22 1415 8     Pain Loc --      Pain Edu? --      Excl. in GC? --    No data found.  Updated Vital Signs BP 126/84 (BP Location: Left Arm)   Pulse 89   Temp 98 F (36.7 C) (Oral)   Resp 18   SpO2 95%   Visual Acuity Right Eye Distance:   Left Eye Distance:   Bilateral Distance:    Right Eye Near:   Left Eye Near:    Bilateral Near:     Physical Exam Vitals reviewed.  HENT:     Head: Normocephalic.  Eyes:     Extraocular Movements: Extraocular movements intact.     Pupils: Pupils are equal, round, and reactive to light.  Cardiovascular:     Rate and Rhythm: Normal rate.  Pulmonary:     Effort: Pulmonary effort is normal.  Skin:      Neurological:     General: No focal deficit present.     Mental Status: She is alert.      UC Treatments / Results  Labs (all labs ordered are listed, but only abnormal results are displayed) Labs Reviewed - No data to display  EKG   Radiology No results found.  Procedures Procedures (including critical care time)  Medications Ordered in UC Medications  dexamethasone (DECADRON) injection 10 mg (has no administration in time range)    Initial Impression / Assessment and Plan / UC Course  I have reviewed the triage vital signs and the nursing notes.  Pertinent labs & imaging results that were available during my care of the patient were reviewed by me and considered in my medical decision making (see chart for details).    Suspect reaction to skin suture material given the appearance  and location of the skin eruption and patient continues to have stanchion of the rash therefore treating as a contact dermatitis. Decadron 10 mg IM given here in clinic.  Patient will  continue oral prednisone starting tomorrow x 5 days.  Patient prescribed topical triamcinolone cream to apply to areas away from the surgical site.  Patient is scheduled to follow-up with her surgeon in 5 days.  Return precautions given. Final Clinical Impressions(s) / UC Diagnoses   Final diagnoses:  Reaction of skin due to suture material  Irritant contact dermatitis due to other agents     Discharge Instructions      You can apply triamcinolone cream to the rash however avoid applying near or on the surgical incision.  You received a steroid injection of Decadron here in clinic.   Tomorrow start 10 mg of oral prednisone and take 1 tablet daily for 5 days, you should finish this medication around the same time as your appointment for follow-up with your surgeon.  If the rash worsens or you become concerned regarding an infection return here for evaluation or follow-up with your surgeon or PCP.  Your surgical incision today looks great and there is no concern for any infection on exam today.     ED Prescriptions     Medication Sig Dispense Auth. Provider   triamcinolone cream (KENALOG) 0.1 % Apply 1 Application topically 2 (two) times daily as needed. 454 g Bing Neighbors, NP   predniSONE (DELTASONE) 10 MG tablet Take 1 tablet (10 mg total) by mouth daily with breakfast for 5 days. 5 tablet Bing Neighbors, NP      PDMP not reviewed this encounter.   Bing Neighbors, NP 12/29/22 1454

## 2022-12-29 NOTE — Discharge Instructions (Signed)
You can apply triamcinolone cream to the rash however avoid applying near or on the surgical incision.  You received a steroid injection of Decadron here in clinic.   Tomorrow start 10 mg of oral prednisone and take 1 tablet daily for 5 days, you should finish this medication around the same time as your appointment for follow-up with your surgeon.  If the rash worsens or you become concerned regarding an infection return here for evaluation or follow-up with your surgeon or PCP.  Your surgical incision today looks great and there is no concern for any infection on exam today.

## 2023-01-24 ENCOUNTER — Telehealth (INDEPENDENT_AMBULATORY_CARE_PROVIDER_SITE_OTHER): Payer: Self-pay

## 2023-01-24 NOTE — Telephone Encounter (Signed)
See my chart message

## 2023-02-07 ENCOUNTER — Encounter: Payer: Self-pay | Admitting: Family Medicine

## 2023-02-08 ENCOUNTER — Other Ambulatory Visit: Payer: Self-pay

## 2023-02-08 DIAGNOSIS — M79661 Pain in right lower leg: Secondary | ICD-10-CM

## 2023-02-14 ENCOUNTER — Telehealth: Payer: Self-pay | Admitting: Family Medicine

## 2023-02-14 DIAGNOSIS — I10 Essential (primary) hypertension: Secondary | ICD-10-CM

## 2023-02-14 DIAGNOSIS — Z79899 Other long term (current) drug therapy: Secondary | ICD-10-CM

## 2023-02-14 DIAGNOSIS — K635 Polyp of colon: Secondary | ICD-10-CM

## 2023-02-14 DIAGNOSIS — I700 Atherosclerosis of aorta: Secondary | ICD-10-CM

## 2023-02-14 DIAGNOSIS — E782 Mixed hyperlipidemia: Secondary | ICD-10-CM

## 2023-02-14 NOTE — Telephone Encounter (Signed)
Pt would like to have labs done before her 03/19/23 annual visit. Please advise

## 2023-02-14 NOTE — Telephone Encounter (Signed)
Attempted to contact pt but had connection issues . Will attempt to contact again at a later time .

## 2023-02-14 NOTE — Telephone Encounter (Signed)
Patient returned call after technical issue from call with [facility]. Pt has been scheduled for labs on 7/22 @ 8:30am. Pt would like lipid panel results shared with Dr. Shari Prows, cardiology.

## 2023-03-11 NOTE — Progress Notes (Signed)
New Patient Note  RE: Shannon Osborne MRN: [id number] DOB: Dec 10, 1955 Date of Office Visit: 03/12/2023  Consult requested by: Willow Ora, MD Primary care provider: Willow Ora, MD  Chief Complaint: Allergies and Establish Care  History of Present Illness: I had the pleasure of seeing Shannon Osborne for [facility]  on 03/13/2023. She is a 67 y.o. adult, who is referred here by Willow Ora, MD for the evaluation of drug allergies.  Patient had back surgery in April 2024 and she was unable to get any pain medications due to many pain medications being on her allergy list. She was discharged with oxycodone possibly but not sure and she had no rash but she felt some shortness of breath at night. She wasn't sure if it was due to the medications or not. Patient will check on what pain medication she took at home - Oxycodone 10mg  Immediate Rel Tabs 30 tablets.  Patient had a reaction to Vicodin after she broke her left shoulder.  She took it for about 1 week and then she broke out in a rash. She apparently was drinking orange juice that day. Sometimes she notices that if she drinks orange juice with new medications then it breaks her out.  She can drink orange juice by itself with no issues.   She also had iodine in the past and drank orange juice afterwards and broke out in a rash. Patient usually premedicates with contrast now with no issues.   Patient tolerates ibuprofen, tylenol, morphine with no issues.   She is not sure what happened with hctz. Takes other blood pressure medications now with no issues.   Not sure what happened with Lexapro.   Patient just wants to make sure she can take some type of pain medications in the future if needed after a surgical procedure.   Food About 10-15 years ago patient started to have a cough which was bad.  She had some type of hair analysis done. As long as she is avoiding buckwheat, barley and  isomolt the cough goes away.   Dietary History: patient has been eating other foods including milk, eggs, peanut, treenuts, sesame, shellfish, fish, soy, wheat, meats, fruits and vegetables.  Wine and beer causes nasal congestion.   Takes Prilosec prn with good benefit.  Assessment and Plan: Shannon Osborne is a 67 y.o. adult with: Multiple drug reactions/side effects Multiple pain medications listed on her allergy list as causing rash only. She states that sometimes when she drinks orange juice and has a new medication it "breaks her out" but can drink orange juice by itself with no problems. Most recently had back surgery and there was issues with which pain medication to give her. Eventually she was discharged with oxycodone and had NO rash but some SOB but not sure if it's was due to the oxycodone or not. Tolerates ibuprofen, tylenol and morphine. Discussed with patient at length that there is no bloodwork or skin testing to some of these medications on her "allergy" list.  The muscle aches and insomnia are NOT allergic reactions but more like side effects. Regarding the contrast - recommend following radiologist's premedication protocol which has worked well in the past.  Regarding the narcotic pain medications - I do not do narcotic drug challenges in our office.  Advised her to call other allergy departments in academic centers such as [place], [place] and [facility] to see if they offer drug challenges to narcotics. However,  the fact that she had oxycodone recently with no rash is reassuring. I'm not sure what to make of the shortness of breath episodes. She most likely can take oxycodone if needed in the future with extra awareness of her issues with past pain medications.   Other adverse food reactions, not elsewhere classified, subsequent encounter Coughing if eats buckwheat, barley and isomalt. Nasal congestion with beer and wine. Avoid the foods that are bothersome to you - buckwheat, barley and isomalt,  beer and wine.  Get bloodwork for buckwheat, barley.  Other allergic rhinitis Some nasal congestion. No prior work up. Unable to skin test today due to recent antihistamine intake. Return for environmental allergy testing if interested. Use Flonase (fluticasone) nasal spray 1 spray per nostril twice a day as needed for nasal congestion.  Nasal saline spray (i.e., Simply Saline) or nasal saline lavage (i.e., NeilMed) is recommended as needed and prior to medicated nasal sprays.  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 Get bloodwork. For mild symptoms you can take over the counter antihistamines such as Benadryl and monitor symptoms closely. If symptoms worsen or if you have severe symptoms including breathing issues, throat closure, significant swelling, whole body hives, severe diarrhea and vomiting, lightheadedness then seek immediate medical care.  Return for Skin testing.  No orders of the defined types were placed in this encounter.  Lab Orders         Buckwheat         Allergen Barley f6         Allergen Hymenoptera Panel         Tryptase      Other allergy screening: Asthma: no Rhino conjunctivitis: yes Some nasal congestion. No prior allergy testing. Takes Flonase prn with good benefit.  Hymenoptera allergy:  Large localized reactions.   Urticaria: no Eczema: yes in the past.  History of recurrent infections suggestive of immunodeficency: no  Diagnostics: Skin Testing: Deferred due to recent antihistamines use.   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 03/12/2023   Other allergic rhinitis 03/12/2023   Asystole (HCC) 08/30/2022   Nonobstructive atherosclerosis of coronary artery 08/30/2022   Spondylolisthesis of lumbar region 08/29/2022   History of asbestos exposure 03/02/2022    Mild mitral regurgitation 03/02/2022   Atherosclerosis of aorta (HCC) 03/02/2022   Agatston coronary artery calcium score between 200 and 399 03/02/2022   Essential hypertension 07/07/2021   Osteopenia after menopause 02/11/2021   Long-term current use of proton pump inhibitor therapy 11/29/2020   Postherpetic neuralgia 04/28/2020   History of breast cancer 02/18/2020   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   GERD (gastroesophageal reflux disease) 02/18/2020   Myalgia due to statin 02/18/2020   Mixed hyperlipidemia 02/18/2020   Colon polyp 02/18/2020   Multiple drug reactions/side effects 02/18/2020   Status post bilateral mastectomy 02/18/2020   Chronic midline low back pain without sciatica 02/18/2020   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   Post-traumatic arthritis of right foot 01/16/2020   Status post shoulder replacement, 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   Breast cancer (HCC) 2009   BRACA 1 Pos   Chronic midline low back pain without sciatica 02/18/2020   Coronary artery disease    mild  COVID-19 10/20/2022   Family history of adverse reaction to anesthesia    mother is slow to wake up   Fibromyalgia    GERD (gastroesophageal reflux disease) 02/18/2020   Hyperlipemia    Hypertension    Mitral valve prolapse    never has caused any problems per patient 12/04/22   Multiple drug allergies 02/18/2020   Myalgia due to statin 02/18/2020   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   MRI 05/2018   Osteopenia after menopause 02/11/2021   DEXA 01/2021 lowest T = -1.7 femur; recheck 2-3 years   Pneumonia    x 1 - years ago   PONV (postoperative nausea and vomiting)    after most recent colonoscopy (2020)   Past Surgical History: Past Surgical History:  Procedure Laterality Date   ABDOMINAL HYSTERECTOMY  2002   BREAST SURGERY     right 09/10/2007,  left 06/16/2009   colonosocpy     several   FOOT BONE EXCISION Right 2021   at [facility]  - fusion heel bone   SHOULDER ARTHROTOMY Left 2013   and 06/2015   TONSILLECTOMY  2012   TRANSFORAMINAL LUMBAR INTERBODY FUSION (TLIF) WITH PEDICLE SCREW FIXATION 1 LEVEL N/A 12/11/2022   Procedure: Lumbar five-Sacral one Laminectomy, Transforaminal Lumbar Interbody Fusion, posterolateral instrumented fusion;  Surgeon: Jadene Pierini, MD;  Location: [facility] OR;  Service: Neurosurgery;  Laterality: N/A;   UPPER GI ENDOSCOPY     Medication List:  Current Outpatient Medications  Medication Sig Dispense Refill   aspirin EC 81 MG tablet Take 1 tablet (81 mg total) by mouth daily. Swallow whole. 30 tablet 11   azelastine (ASTELIN) 0.1 % nasal spray Place 1 spray into both nostrils 2 (two) times daily. Use in each nostril as directed     carisoprodol (SOMA) 350 MG tablet Take 1 tablet (350 mg total) by mouth 3 (three) times daily as needed for muscle spasms. 90 tablet 0   cholecalciferol (VITAMIN D3) 25 MCG (1000 UNIT) tablet Take 1,000 Units by mouth 2 (two) times daily.     Evolocumab (REPATHA SURECLICK) 140 MG/ML SOAJ Inject 140 mg into the skin every 14 (fourteen) days. 2 mL 11   ezetimibe (ZETIA) 10 MG tablet TAKE 1 TABLET AT BEDTIME 90 tablet 3   fluticasone (FLONASE SENSIMIST) 27.5 MCG/SPRAY nasal spray Place 2 sprays into the nose daily as needed for rhinitis or allergies.     icosapent Ethyl (VASCEPA) 1 g capsule Take 2 capsules (2 g total) by mouth 2 (two) times daily. 360 capsule 3   losartan (COZAAR) 25 MG tablet Take 1 tablet (25 mg total) by mouth daily. (Patient taking differently: Take 25 mg by mouth every evening.) 90 tablet 1   melatonin 3 MG TABS tablet Take 3 mg by mouth at bedtime.     metoprolol succinate (TOPROL-XL) 25 MG 24 hr tablet TAKE 1 TABLET BY MOUTH EVERY DAY 90 tablet 1   Multiple Vitamin (MULTIVITAMIN WITH MINERALS) TABS tablet Take 1 tablet by mouth daily.     Omega-3 Fatty Acids  (OMEGA 3 PO) Take 1 capsule by mouth daily.     omeprazole (PRILOSEC) 20 MG capsule Take 1 capsule (20 mg total) by mouth daily as needed (acid reflux). (Patient taking differently: Take 20 mg by mouth daily.)     pregabalin (LYRICA) 25 MG capsule Take 25 mg by mouth 2 (two) times daily.     traZODone (DESYREL) 50 MG tablet Take 50 mg by mouth at bedtime as needed  for sleep.     triamcinolone cream (KENALOG) 0.1 % Apply 1 Application topically 2 (two) times daily as needed. 454 g 0   No current facility-administered medications for this visit.   Allergies: Allergies  Allergen Reactions   Baclofen Other (See Comments)    insomnia   Barley Grass Cough   Buckwheat Cough   Cyclobenzaprine Other (See Comments)    Insomnia   Escitalopram Oxalate Other (See Comments)    Unknown reaction    Iodine Nausea And Vomiting   Lyrica [Pregabalin]     Couldn't focus   Other     Rye - cough Beer - sinus congestion  E953 isomalt - cough   Rosuvastatin Other (See Comments)    myalgia   Sertraline Other (See Comments)    Makes depression worse   Simvastatin Other (See Comments)    myalgia   Vanilla Cough   Atorvastatin Other (See Comments)    myalgia   Codeine Rash   Contrast Media [Iodinated Contrast Media] Rash    Chest pressure    Hydrochlorothiazide W-Triamterene Rash   Hydrocodone-Acetaminophen Rash   Maxzide [Triamterene-Hctz] Rash   Oxycodone Hcl Rash    Patient took in 2024 after surgery with no rash.   Social History: Social History   Socioeconomic History   Marital status: Single    Spouse name: Not on file   Number of children: Not on file   Years of education: Not on file   Highest education level: Not on file  Occupational History   Not on file  Tobacco Use   Smoking status: Never    Passive exposure: Past   Smokeless tobacco: Never  Vaping Use   Vaping status: Never Used  Substance and Sexual Activity   Alcohol use: Yes    Comment: 10 drinks a year    Drug  use: Never   Sexual activity: Yes    Partners: Male    Birth control/protection: Surgical    Comment: Hysterectomy  Other Topics Concern   Not on file  Social History Narrative   Not on file   Social Determinants of Health   Financial Resource Strain: Low Risk  (10/10/2022)   Overall Financial Resource Strain (CARDIA)    Difficulty of Paying Living Expenses: Not hard at all  Food Insecurity: No Food Insecurity (10/10/2022)   Hunger Vital Sign    Worried About Running Out of Food in the Last Year: Never true    Ran Out of Food in the Last Year: Never true  Transportation Needs: No Transportation Needs (10/10/2022)   PRAPARE - Transportation    Lack of Transportation (Medical): No    Lack of Transportation (Non-Medical): No  Physical Activity: Sufficiently Active (10/10/2022)   Exercise Vital Sign    Days of Exercise per Week: 5 days    Minutes of Exercise per Session: 60 min  Stress: No Stress Concern Present (10/10/2022)   [employer] of Occupational Health - Occupational Stress Questionnaire    Feeling of Stress : Not at all  Social Connections: Socially Isolated (10/10/2022)   Social Connection and Isolation Panel [NHANES]    Frequency of Communication with Friends and Family: More than three times a week    Frequency of Social Gatherings with Friends and Family: More than three times a week    Attends Religious Services: Never    Database administrator or Organizations: No    Attends Banker Meetings: Never    Marital Status: Never married  Lives in a house. Smoking: denies Occupation: retired  Landscape architect HistorySurveyor, minerals in the house: no Engineer, civil (consulting) in the family room: no Carpet in the bedroom: yes Heating: electric and gas Cooling: central Pet: yes 2 dogs  Family History: Family History  Problem Relation Age of Onset   Asthma Mother    Hypertension Mother    Colon polyps Mother    Gallstones Mother    Allergic rhinitis Father     Stroke Father    Hypertension Brother    Gallstones Brother    Hypertension Brother    Gallstones Brother    Hypertension Brother    Gallstones Brother    Review of Systems  Constitutional:  Negative for appetite change, chills, fever and unexpected weight change.  HENT:  Positive for congestion. Negative for rhinorrhea.   Eyes:  Negative for itching.  Respiratory:  Negative for cough, chest tightness, shortness of breath and wheezing.   Cardiovascular:  Negative for chest pain.  Gastrointestinal:  Negative for abdominal pain.  Genitourinary:  Negative for difficulty urinating.  Skin:  Positive for rash (from bee sting).  Neurological:  Negative for headaches.    Objective: BP (!) 142/80 (BP Location: Left Arm, Patient Position: Sitting, Cuff Size: Large)   Pulse 88   Temp 98.5 F (36.9 C) (Temporal)   Ht 4\' 11"  (1.499 m)   Wt 145 lb 8 oz (66 kg)   SpO2 96%   BMI 29.39 kg/m  Body mass index is 29.39 kg/m. Physical Exam Vitals and nursing note reviewed.  Constitutional:      Appearance: Normal appearance. She is well-developed.  HENT:     Head: Normocephalic and atraumatic.     Right Ear: External ear normal. There is impacted cerumen.     Left Ear: External ear normal. There is impacted cerumen.     Nose: Congestion present.     Mouth/Throat:     Mouth: Mucous membranes are moist.     Pharynx: Oropharynx is clear.  Eyes:     Conjunctiva/sclera: Conjunctivae normal.  Cardiovascular:     Rate and Rhythm: Normal rate and regular rhythm.     Heart sounds: Normal heart sounds. No murmur heard.    No friction rub. No gallop.  Pulmonary:     Effort: Pulmonary effort is normal.     Breath sounds: Normal breath sounds. No wheezing, rhonchi or rales.  Musculoskeletal:     Cervical back: Neck supple.  Skin:    General: Skin is warm.     Findings: Rash present.     Comments: Slight erythematous patch on right forearm area.   Neurological:     Mental Status: She is  alert and oriented to person, place, and time.  Psychiatric:        Behavior: Behavior normal.   The plan was reviewed with the patient/family, and all questions/concerned were addressed.  It was my pleasure to see Shannon Osborne today and participate in her care. Please feel free to contact me with any questions or concerns.  Sincerely,  Wyline Mood, DO Allergy & Immunology  [facility] office: [phone] [facility] office: [phone]

## 2023-03-12 ENCOUNTER — Other Ambulatory Visit: Payer: Medicare Other

## 2023-03-12 ENCOUNTER — Ambulatory Visit (INDEPENDENT_AMBULATORY_CARE_PROVIDER_SITE_OTHER): Payer: Medicare Other | Admitting: Allergy

## 2023-03-12 ENCOUNTER — Ambulatory Visit: Payer: Medicare Other | Admitting: Family Medicine

## 2023-03-12 ENCOUNTER — Other Ambulatory Visit (INDEPENDENT_AMBULATORY_CARE_PROVIDER_SITE_OTHER): Payer: Medicare Other

## 2023-03-12 ENCOUNTER — Other Ambulatory Visit: Payer: Self-pay

## 2023-03-12 ENCOUNTER — Encounter: Payer: Self-pay | Admitting: Allergy

## 2023-03-12 VITALS — BP 142/80 | HR 88 | Temp 98.5°F | Ht 59.0 in | Wt 145.5 lb

## 2023-03-12 DIAGNOSIS — K635 Polyp of colon: Secondary | ICD-10-CM | POA: Diagnosis not present

## 2023-03-12 DIAGNOSIS — Z79899 Other long term (current) drug therapy: Secondary | ICD-10-CM

## 2023-03-12 DIAGNOSIS — I700 Atherosclerosis of aorta: Secondary | ICD-10-CM

## 2023-03-12 DIAGNOSIS — I10 Essential (primary) hypertension: Secondary | ICD-10-CM | POA: Diagnosis not present

## 2023-03-12 DIAGNOSIS — T63481A Toxic effect of venom of other arthropod, accidental (unintentional), initial encounter: Secondary | ICD-10-CM | POA: Diagnosis not present

## 2023-03-12 DIAGNOSIS — Z889 Allergy status to unspecified drugs, medicaments and biological substances status: Secondary | ICD-10-CM

## 2023-03-12 DIAGNOSIS — E782 Mixed hyperlipidemia: Secondary | ICD-10-CM

## 2023-03-12 DIAGNOSIS — J3089 Other allergic rhinitis: Secondary | ICD-10-CM | POA: Diagnosis not present

## 2023-03-12 DIAGNOSIS — Z789 Other specified health status: Secondary | ICD-10-CM

## 2023-03-12 DIAGNOSIS — I2510 Atherosclerotic heart disease of native coronary artery without angina pectoris: Secondary | ICD-10-CM

## 2023-03-12 DIAGNOSIS — T781XXD Other adverse food reactions, not elsewhere classified, subsequent encounter: Secondary | ICD-10-CM

## 2023-03-12 LAB — COMPREHENSIVE METABOLIC PANEL
ALT: 22 U/L (ref 0–35)
AST: 23 U/L (ref 0–37)
Albumin: 4.3 g/dL (ref 3.5–5.2)
Alkaline Phosphatase: 80 U/L (ref 39–117)
BUN: 16 mg/dL (ref 6–23)
CO2: 29 mEq/L (ref 19–32)
Calcium: 9.3 mg/dL (ref 8.4–10.5)
Chloride: 100 mEq/L (ref 96–112)
Creatinine, Ser: 0.65 mg/dL (ref 0.40–1.20)
GFR: 89.97 mL/min (ref >60.00)
Glucose, Bld: 102 mg/dL — ABNORMAL HIGH (ref 70–99)
Potassium: 4.5 mEq/L (ref 3.5–5.1)
Sodium: 138 mEq/L (ref 135–145)
Total Bilirubin: 0.4 mg/dL (ref 0.2–1.2)
Total Protein: 6.8 g/dL (ref 6.0–8.3)

## 2023-03-12 LAB — LIPID PANEL
Cholesterol: 141 mg/dL (ref 0–200)
HDL: 45.3 mg/dL (ref >39.00)
NonHDL: 95.37
Total CHOL/HDL Ratio: 3
Triglycerides: 321 mg/dL — ABNORMAL HIGH (ref 0.0–149.0)
VLDL: 64.2 mg/dL — ABNORMAL HIGH (ref 0.0–40.0)

## 2023-03-12 LAB — CBC WITH DIFFERENTIAL/PLATELET
Basophils Absolute: 0 10*3/uL (ref 0.0–0.1)
Basophils Relative: 0.4 % (ref 0.0–3.0)
Eosinophils Absolute: 0.1 10*3/uL (ref 0.0–0.7)
Eosinophils Relative: 2.2 % (ref 0.0–5.0)
HCT: 36.5 % (ref 36.0–46.0)
Hemoglobin: 12 g/dL (ref 12.0–15.0)
Lymphocytes Relative: 33.8 % (ref 12.0–46.0)
Lymphs Abs: 1.7 10*3/uL (ref 0.7–4.0)
MCHC: 32.8 g/dL (ref 30.0–36.0)
MCV: 89.1 fl (ref 78.0–100.0)
Monocytes Absolute: 0.4 10*3/uL (ref 0.1–1.0)
Monocytes Relative: 8.1 % (ref 3.0–12.0)
Neutro Abs: 2.7 10*3/uL (ref 1.4–7.7)
Neutrophils Relative %: 55.5 % (ref 43.0–77.0)
Platelets: 250 10*3/uL (ref 150.0–400.0)
RBC: 4.1 Mil/uL (ref 3.87–5.11)
RDW: 13.2 % (ref 11.5–15.5)
WBC: 4.9 10*3/uL (ref 4.0–10.5)

## 2023-03-12 LAB — VITAMIN B12: Vitamin B-12: 494 pg/mL (ref 211–911)

## 2023-03-12 LAB — LDL CHOLESTEROL, DIRECT: Direct LDL: 51 mg/dL

## 2023-03-12 LAB — TSH: TSH: 2.93 u[IU]/mL (ref 0.35–5.50)

## 2023-03-12 NOTE — Assessment & Plan Note (Signed)
Multiple pain medications listed on her allergy list as causing rash only. She states that sometimes when she drinks orange juice and has a new medication it "breaks her out" but can drink orange juice by itself with no problems. Most recently had back surgery and there was issues with which pain medication to give her. Eventually she was discharged with oxycodone and had NO rash but some SOB but not sure if it's was due to the oxycodone or not. Tolerates ibuprofen, tylenol and morphine. Discussed with patient at length that there is no bloodwork or skin testing to some of these medications on her "allergy" list.  The muscle aches and insomnia are NOT allergic reactions but more like side effects. Regarding the contrast - recommend following radiologist's premedication protocol which has worked well in the past.  Regarding the narcotic pain medications - I do not do narcotic drug challenges in our office.  Advised her to call other allergy departments in academic centers such as [place], [place] and [facility] to see if they offer drug challenges to narcotics. However, the fact that she had oxycodone recently with no rash is reassuring. I'm not sure what to make of the shortness of breath episodes. She most likely can take oxycodone if needed in the future.

## 2023-03-12 NOTE — Patient Instructions (Addendum)
Drug There is no bloodwork or skin testing to some of these medications on your "allergy" list.  The muscle aches and insomnia you have with the medications are NOT allergic reactions but more like side effects. Regarding the contrast - recommend following radiologist's premedication protocol. Regarding the narcotic pain medications - I do not do drug challenges to them. You can try to call other allergy departments in academic centers such as [place], [place] and [facility] to see if they offer drug challenges to narcotics. However, the fact that you had oxycodone recently with no rash is good. I'm not sure what to make of the shortness of breath.  I would just tell your future surgeons that you had it recently with no rash.   Food Avoid the foods that are bothersome to you - buckwheat, barley and isomalt, beer and wine.  Get bloodwork for buckwheat, barley.  Bee stings Avoid future stings. Get bloodwork. For mild symptoms you can take over the counter antihistamines such as Benadryl and monitor symptoms closely. If symptoms worsen or if you have severe symptoms including breathing issues, throat closure, significant swelling, whole body hives, severe diarrhea and vomiting, lightheadedness then seek immediate medical care.  Rhinitis Unable to skin test today due to recent antihistamine intake. Return for environmental allergy testing. Use Flonase (fluticasone) nasal spray 1 spray per nostril twice a day as needed for nasal congestion.  Nasal saline spray (i.e., Simply Saline) or nasal saline lavage (i.e., NeilMed) is recommended as needed and prior to medicated nasal sprays.  Follow up for environmental allergies.

## 2023-03-13 ENCOUNTER — Encounter: Payer: Self-pay | Admitting: Cardiology

## 2023-03-13 ENCOUNTER — Encounter: Payer: Self-pay | Admitting: Allergy

## 2023-03-13 LAB — LIPID PANEL
Chol/HDL Ratio: 3.3 ratio (ref 0.0–4.4)
Cholesterol, Total: 145 mg/dL (ref 100–199)
HDL: 44 mg/dL (ref >39)
LDL Chol Calc (NIH): 52 mg/dL (ref 0–99)
Triglycerides: 319 mg/dL — ABNORMAL HIGH (ref 0–149)
VLDL Cholesterol Cal: 49 mg/dL — ABNORMAL HIGH (ref 5–40)

## 2023-03-13 NOTE — Assessment & Plan Note (Signed)
Coughing if eats buckwheat, barley and isomalt. Nasal congestion with beer and wine. Avoid the foods that are bothersome to you - buckwheat, barley and isomalt, beer and wine.  Get bloodwork for buckwheat, barley.

## 2023-03-13 NOTE — Assessment & Plan Note (Signed)
Avoid future stings. Get bloodwork. For mild symptoms you can take over the counter antihistamines such as Benadryl and monitor symptoms closely. If symptoms worsen or if you have severe symptoms including breathing issues, throat closure, significant swelling, whole body hives, severe diarrhea and vomiting, lightheadedness then seek immediate medical care.

## 2023-03-13 NOTE — Assessment & Plan Note (Signed)
Some nasal congestion. No prior work up. Unable to skin test today due to recent antihistamine intake. Return for environmental allergy testing if interested. Use Flonase (fluticasone) nasal spray 1 spray per nostril twice a day as needed for nasal congestion.  Nasal saline spray (i.e., Simply Saline) or nasal saline lavage (i.e., NeilMed) is recommended as needed and prior to medicated nasal sprays.

## 2023-03-14 ENCOUNTER — Telehealth: Payer: Self-pay | Admitting: *Deleted

## 2023-03-14 DIAGNOSIS — Z789 Other specified health status: Secondary | ICD-10-CM

## 2023-03-14 DIAGNOSIS — I2510 Atherosclerotic heart disease of native coronary artery without angina pectoris: Secondary | ICD-10-CM

## 2023-03-14 DIAGNOSIS — E782 Mixed hyperlipidemia: Secondary | ICD-10-CM

## 2023-03-14 DIAGNOSIS — Z79899 Other long term (current) drug therapy: Secondary | ICD-10-CM

## 2023-03-14 LAB — F011-IGE BUCKWHEAT

## 2023-03-14 LAB — ALLERGEN HYMENOPTERA PANEL

## 2023-03-14 LAB — TRYPTASE: Tryptase: 5.6 ug/L (ref 2.2–13.2)

## 2023-03-14 LAB — ALLERGEN BARLEY F6: Allergen Barley IgE: <0.1 kU/L

## 2023-03-14 NOTE — Telephone Encounter (Signed)
-----   Message from Meriam Sprague sent at 03/13/2023 12:35 PM EDT ----- LDL cholesterol looks markedly improved with resuming reaptha. TG still elevated in 300s. Is she back on the vascepa? If so, would continue lifestyle modifications. If not, can we have her resume it and repeat lipids at next visit.

## 2023-03-14 NOTE — Telephone Encounter (Signed)
Meriam Sprague, MD  to [name]  HP    03/14/23  8:31 AM Hi Shannon Osborne for you for working with a nutritionist and working on weight loss!!! I will let them guide how many carbs they recommend (typically low carb diets have <60g of carbs per day but again, there are many different variations). I just would not substitute the carbs for unhealthy fats but rather lean proteins like chicken/fish and plenty of vegetables. I think tracking this is a great place to start and we can repeat your cholesterol after 3 months of the dietary changes to see where you are!   Happy to refer you to Dr. Cristal Deer as well. She is out at [facility] office and is absolutely wonderful. You can call and schedule an appointment with her at any time! She has access to everything and will take excellent care of you!  This MyChart message has not been read.   Left the pt a message to call the office back to further discuss her lipid results and to relay mychart message above, that Dr. Shari Prows sent to her earlier today.   Will go ahead and place the repeat lipids in 3 months in the system, so when pt returns a callback or messages back, the lab appt can be arranged at that time.  Will have Dr. Di Kindle scheduler reach out to her to make her next follow-up appt to establish care, if the pt agrees to have her take over her cardiac care in place of Dr. Shari Prows leaving the practice.  Will await a return callback/message from the pt about this.

## 2023-03-14 NOTE — Telephone Encounter (Signed)
Pt returned a call back.  She will proceed with healthy lifestyle/diet modification and work with her new nutritionist, and come in for repeat lipids in 3-4 months.  Scheduled her 3-4 month repeat lipid check to reassess if her diet impacted her numbers, for 06/29/23.  She is aware to come fasting to this lab appt.   Pt states she agrees that she should establish her cardiac care with Dr. Cristal Deer at our [facility] location.  Pt is aware that I will send a message to Dr. Di Kindle scheduler Joyce Gross, and have her call the pt back to arrange her next follow-up appt with them, at any next available appt slot.   Pt verbalized understanding and agrees with this plan.

## 2023-03-15 ENCOUNTER — Telehealth (HOSPITAL_BASED_OUTPATIENT_CLINIC_OR_DEPARTMENT_OTHER): Payer: Self-pay | Admitting: Cardiology

## 2023-03-15 NOTE — Telephone Encounter (Signed)
Left message for patient to call and discuss scheduling the 3-4 month follow up with Dr. Cristal Deer (former patient of Dr. Shari Prows)

## 2023-03-19 ENCOUNTER — Encounter: Payer: Self-pay | Admitting: Family Medicine

## 2023-03-19 ENCOUNTER — Ambulatory Visit (INDEPENDENT_AMBULATORY_CARE_PROVIDER_SITE_OTHER): Payer: Medicare Other | Admitting: Family Medicine

## 2023-03-19 VITALS — BP 120/76 | HR 80 | Temp 98.3°F | Ht 59.0 in | Wt 144.4 lb

## 2023-03-19 DIAGNOSIS — Z1501 Genetic susceptibility to malignant neoplasm of breast: Secondary | ICD-10-CM | POA: Diagnosis not present

## 2023-03-19 DIAGNOSIS — H9313 Tinnitus, bilateral: Secondary | ICD-10-CM

## 2023-03-19 DIAGNOSIS — I10 Essential (primary) hypertension: Secondary | ICD-10-CM | POA: Diagnosis not present

## 2023-03-19 DIAGNOSIS — I700 Atherosclerosis of aorta: Secondary | ICD-10-CM

## 2023-03-19 DIAGNOSIS — Z1509 Genetic susceptibility to other malignant neoplasm: Secondary | ICD-10-CM

## 2023-03-19 DIAGNOSIS — Z853 Personal history of malignant neoplasm of breast: Secondary | ICD-10-CM

## 2023-03-19 DIAGNOSIS — M8580 Other specified disorders of bone density and structure, unspecified site: Secondary | ICD-10-CM

## 2023-03-19 DIAGNOSIS — M4726 Other spondylosis with radiculopathy, lumbar region: Secondary | ICD-10-CM

## 2023-03-19 DIAGNOSIS — Z15068 Genetic susceptibility to other malignant neoplasm of digestive system: Secondary | ICD-10-CM

## 2023-03-19 DIAGNOSIS — E782 Mixed hyperlipidemia: Secondary | ICD-10-CM | POA: Diagnosis not present

## 2023-03-19 DIAGNOSIS — G729 Myopathy, unspecified: Secondary | ICD-10-CM

## 2023-03-19 DIAGNOSIS — Z780 Asymptomatic menopausal state: Secondary | ICD-10-CM

## 2023-03-19 DIAGNOSIS — R931 Abnormal findings on diagnostic imaging of heart and coronary circulation: Secondary | ICD-10-CM

## 2023-03-19 MED ORDER — OMEPRAZOLE 20 MG PO CPDR: 20.0000 mg | DELAYED_RELEASE_CAPSULE | Freq: Every day | ORAL | Status: AC

## 2023-03-19 MED ORDER — LOSARTAN POTASSIUM 25 MG PO TABS: 25.0000 mg | ORAL_TABLET | Freq: Every evening | ORAL | Status: DC

## 2023-03-19 NOTE — Patient Instructions (Addendum)
Please return in 12 months for your annual complete physical; please come fasting.   If you have any questions or concerns, please don't hesitate to send me a message via MyChart or call the office at [phone]. Thank you for visiting with [place] today! It's our pleasure caring for you.   Please call the office checked below to schedule your appointment for your mammogram and/or bone density screen (the checked studies were ordered): []   Mammogram  [x]   Bone Density  [x]   [facility]     [address] [place], [place]        [phone]         []   [facility]  [address] [place], [place]  [phone]

## 2023-03-19 NOTE — Progress Notes (Addendum)
Subjective  Chief Complaint  Patient presents with   Annual Exam    Pt here for Annual Exam and is not currently fasting    Hypertension   Hyperlipidemia   Gastroesophageal Reflux    HPI: Shannon Osborne is a 67 y.o. adult who presents to [facility] [place] today for a Female Wellness Visit. She also has the concerns and/or needs as listed above in the chief complaint. These will be addressed in addition to the Health Maintenance Visit.   Wellness Visit: annual visit with health maintenance review and exam without Pap  HM: current screens; eligible for 2nd shingrix: was to get this week but needs to reschedule. Tolerated first well.   Chronic disease f/u and/or acute problem visit: (deemed necessary to be done in addition to the wellness visit): S/p lumbar laminectomy: and doing great. No more pain. Good rom. Keeping active again.  HTN: well controlled on meds w/o cp. To get established with new cardiologist. Reviewed recent notes.  Mild osteopenia: due for recheck.  Hx of breast cancer followed by oncology surveillance and stable and up to date.  HLD on vascepa and repatha managed by lipid clinic; reviewed recent labs. See below. Seeing nutrition to help manage trigs. Tolerates meds well.   Assessment  1. BRCA1 gene mutation positive   2. Mixed hyperlipidemia   3. Essential hypertension   4. Atherosclerosis of aorta (HCC)   5. Osteoarthritis of spine with radiculopathy, lumbar region   6. Myopathy, unspecified   7. History of breast cancer   8. Agatston coronary artery calcium score between 200 and 399   9. Osteopenia after menopause   10. Asymptomatic menopausal state      Plan  Female Wellness Visit: Age appropriate Health Maintenance and Prevention measures were discussed with patient. Included topics are cancer screening recommendations, ways to keep healthy (see AVS) including dietary and exercise recommendations, regular eye and dental care, use of seat  belts, and avoidance of moderate alcohol use and tobacco use.  BMI: discussed patient's BMI and encouraged positive lifestyle modifications to help get to or maintain a target BMI. HM needs and immunizations were addressed and ordered. See below for orders. See HM and immunization section for updates. Discussed recommendations regarding Vit D and calcium supplementation (see AVS)  Chronic disease management visit and/or acute problem visit: Chronic problems are controlled. Reviewed all meds. Pt is thriving.  To f/u with neuro for foot neuropathy To schedule Dexa to f/u osteopenia Conitnue lipid meds and nutrition.  No cp HTN is controlled.  Tinnitus: linked to coca cola use; so has resolved if avoids coca cola Follow up: 12 mo for cpe and f/u  No orders of the defined types were placed in this encounter.  Meds ordered this encounter  Medications   losartan (COZAAR) 25 MG tablet    Sig: Take 1 tablet (25 mg total) by mouth every evening.   omeprazole (PRILOSEC) 20 MG capsule    Sig: Take 1 capsule (20 mg total) by mouth daily.      Body mass index is 29.17 kg/m. Wt Readings from Last 3 Encounters:  03/19/23 144 lb 6.4 oz (65.5 kg)  03/12/23 145 lb 8 oz (66 kg)  12/11/22 145 lb (65.8 kg)     Patient Active Problem List   Diagnosis Date Noted Date Diagnosed   Atherosclerosis of aorta (HCC) 03/02/2022     Priority: High    CT scan and very elevated calcium score 2023  Agatston coronary artery calcium score between 200 and 399 03/02/2022     Priority: High    2023, 90% for age    Essential hypertension 07/07/2021     Priority: High   History of breast cancer 02/18/2020     Priority: High   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     Priority: High   Myalgia due to statin 02/18/2020     Priority: High   Mixed hyperlipidemia 02/18/2020     Priority: High   Multiple drug reactions/side effects 02/18/2020     Priority: High   History of asbestos exposure 03/02/2022      Priority: Medium     About 2 years total; [employer]    Osteopenia after menopause 02/11/2021     Priority: Medium     DEXA 01/2021 lowest T = -1.7 femur; recheck 2-3 years    Long-term current use of proton pump inhibitor therapy 11/29/2020     Priority: Medium    Postherpetic neuralgia 04/28/2020     Priority: Medium    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     Priority: Medium     MRI 05/2018    GERD (gastroesophageal reflux disease) 02/18/2020     Priority: Medium    Colon polyp 02/18/2020     Priority: Medium     15 years ago; 2 normals afterwards. 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 Shari Prows Repeat in 3-5 years    Status post bilateral mastectomy 02/18/2020     Priority: Low   Status post shoulder replacement, 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     Priority: Low   Other adverse food reactions, not elsewhere classified, subsequent encounter 03/12/2023    Local reaction to hymenoptera sting 03/12/2023    Other allergic rhinitis 03/12/2023    Asystole (HCC) 08/30/2022    Nonobstructive atherosclerosis of coronary artery 08/30/2022    Spondylolisthesis of lumbar region 08/29/2022    Health Maintenance  Topic Date Due   Zoster Vaccines- Shingrix (2 of 2) 05/31/2020   DEXA SCAN  01/27/2023   INFLUENZA VACCINE  03/22/2023   COVID-19 Vaccine (6 - 2023-24 season) 04/25/2023   Medicare Annual Wellness (AWV)  10/11/2023   Colonoscopy  10/21/2024   Pneumonia Vaccine 63+ Years old  Completed   Hepatitis C Screening  Completed   HPV VACCINES  Aged Out   DTaP/Tdap/Td  Discontinued   Immunization History  Administered Date(s) Administered   Fluad Quad(high Dose 65+) 04/28/2020, 07/05/2022   Influenza  Whole 05/22/2011   Influenza-Unspecified 06/26/2015, 05/15/2021   PFIZER(Purple Top)SARS-COV-2 Vaccination 10/03/2019, 10/28/2019, 05/28/2020, 03/24/2021   Pfizer Covid-19 Vaccine Bivalent Booster 52yrs & up 02/28/2023   Pneumococcal Conjugate-13 03/17/2015   Pneumococcal Polysaccharide-23 01/14/2009, 11/29/2020   Zoster Recombinant(Shingrix) 04/05/2020   We updated and reviewed the patient's past history in detail and it is documented below. Allergies: Patient is allergic to baclofen, barley grass, buckwheat, cyclobenzaprine, escitalopram oxalate, iodine, lyrica [pregabalin], other, rosuvastatin, sertraline, simvastatin, vanilla, atorvastatin, codeine, contrast media [iodinated contrast media], hydrochlorothiazide w-triamterene, hydrocodone-acetaminophen, maxzide [triamterene-hctz], and oxycodone hcl. Past Medical History Patient  has a past medical history of Anemia, BRCA1 gene mutation positive (02/18/2020), Breast cancer (HCC) (  2009), Chronic midline low back pain without sciatica (02/18/2020), Coronary artery disease, COVID-19 (10/20/2022), Family history of adverse reaction to anesthesia, Fibromyalgia, GERD (gastroesophageal reflux disease) (02/18/2020), Hyperlipemia, Hypertension, Mitral valve prolapse, Multiple drug allergies (02/18/2020), Myalgia due to statin (02/18/2020), Osteoarthritis of lumbar spine (02/18/2020), Osteopenia after menopause (02/11/2021), Pneumonia, and PONV (postoperative nausea and vomiting). Past Surgical History Patient  has a past surgical history that includes Breast surgery; Abdominal hysterectomy (2002); Tonsillectomy (2012); Shoulder arthrotomy (Left, 2013); Foot bone excision (Right, 2021); colonosocpy; Upper gi endoscopy; and Transforaminal lumbar interbody fusion (tlif) with pedicle screw fixation 1 level (N/A, 12/11/2022). Family History: Patient family history includes Allergic rhinitis in her father; Asthma in her mother; Colon polyps in her mother;  Gallstones in her brother, brother, brother, and mother; Hypertension in her brother, brother, brother, and mother; Stroke in her father. Social History:  Patient  reports that she has never smoked. She has been exposed to tobacco smoke. She has never used smokeless tobacco. She reports current alcohol use. She reports that she does not use drugs.  Review of Systems: Constitutional: negative for fever or malaise Ophthalmic: negative for photophobia, double vision or loss of vision Cardiovascular: negative for chest pain, dyspnea on exertion, or new LE swelling Respiratory: negative for SOB or persistent cough Gastrointestinal: negative for abdominal pain, change in bowel habits or melena Genitourinary: negative for dysuria or gross hematuria, no abnormal uterine bleeding or disharge Musculoskeletal: negative for new gait disturbance or muscular weakness Integumentary: negative for new or persistent rashes, no breast lumps Neurological: negative for TIA or stroke symptoms Psychiatric: negative for SI or delusions Allergic/Immunologic: negative for hives  Patient Care Team    Relationship Specialty Notifications Start End  Willow Ora, MD PCP - General Family Medicine  02/18/20   Meriam Sprague, MD PCP - Cardiology Cardiology  10/03/21   Harmon Dun, MD Referring Physician Orthopedic Surgery  02/18/20   Stechschulte, Hyman Hopes, MD Consulting Physician Surgery  07/07/21     Objective  Vitals: BP 120/76   Pulse 80   Temp 98.3 F (36.8 C)   Ht 4\' 11"  (1.499 m)   Wt 144 lb 6.4 oz (65.5 kg)   SpO2 97%   BMI 29.17 kg/m  General:  Well developed, well nourished, no acute distress  Psych:  Alert and orientedx3,normal mood and affect HEENT:  Normocephalic, atraumatic, non-icteric sclera,  supple neck without adenopathy, mass or thyromegaly Cardiovascular:  Normal S1, S2, RRR without gallop, rub or murmur Respiratory:  Good breath sounds bilaterally, CTAB with normal respiratory  effort Gastrointestinal: normal bowel sounds, soft, non-tender, no noted masses. No HSM MSK: extremities without edema, joints without erythema or swelling Neurologic:    Mental status is normal.  Gross motor and sensory exams are normal.  No tremor   Lab Results  Component Value Date   CHOL 145 03/12/2023   CHOL 141 03/12/2023   HDL 44 03/12/2023   HDL 45.30 03/12/2023   LDLCALC 52 03/12/2023   LDLDIRECT 51.0 03/12/2023   TRIG 319 (H) 03/12/2023   TRIG 321.0 (H) 03/12/2023   CHOLHDL 3.3 03/12/2023   CHOLHDL 3 03/12/2023   Lab Results  Component Value Date   NA 138 03/12/2023   CL 100 03/12/2023   K 4.5 03/12/2023   CO2 29 03/12/2023   BUN 16 03/12/2023   CREATININE 0.65 03/12/2023   GFR 89.97 03/12/2023   CALCIUM 9.3 03/12/2023   ALBUMIN 4.3 03/12/2023   GLUCOSE 102 (H) 03/12/2023   Lab  Results  Component Value Date   TSH 2.93 03/12/2023   Lab Results  Component Value Date   WBC 4.9 03/12/2023   HGB 12.0 03/12/2023   HCT 36.5 03/12/2023   MCV 89.1 03/12/2023   PLT 250.0 03/12/2023    Commons side effects, risks, benefits, and alternatives for medications and treatment plan prescribed today were discussed, and the patient expressed understanding of the given instructions. Patient is instructed to call or message via MyChart if he/she has any questions or concerns regarding our treatment plan. No barriers to understanding were identified. We discussed Red Flag symptoms and signs in detail. Patient expressed understanding regarding what to do in case of urgent or emergency type symptoms.  Medication list was reconciled, printed and provided to the patient in AVS. Patient instructions and summary information was reviewed with the patient as documented in the AVS. This note was prepared with assistance of Dragon voice recognition software. Occasional wrong-word or sound-a-like substitutions may have occurred due to the inherent limitations of voice recognition  software

## 2023-03-23 NOTE — Telephone Encounter (Signed)
Left message for patient to call and schedule follow up with Dr. Cristal Deer to establish care (former Dr. Shari Prows patient)

## 2023-03-26 ENCOUNTER — Encounter: Payer: Self-pay | Admitting: Podiatry

## 2023-03-26 ENCOUNTER — Ambulatory Visit (INDEPENDENT_AMBULATORY_CARE_PROVIDER_SITE_OTHER): Payer: Medicare Other | Admitting: Podiatry

## 2023-03-26 ENCOUNTER — Ambulatory Visit (INDEPENDENT_AMBULATORY_CARE_PROVIDER_SITE_OTHER): Payer: Medicare Other

## 2023-03-26 DIAGNOSIS — Z0289 Encounter for other administrative examinations: Secondary | ICD-10-CM | POA: Insufficient documentation

## 2023-03-26 DIAGNOSIS — T7840XA Allergy, unspecified, initial encounter: Secondary | ICD-10-CM | POA: Insufficient documentation

## 2023-03-26 DIAGNOSIS — L209 Atopic dermatitis, unspecified: Secondary | ICD-10-CM | POA: Insufficient documentation

## 2023-03-26 DIAGNOSIS — M7661 Achilles tendinitis, right leg: Secondary | ICD-10-CM | POA: Diagnosis not present

## 2023-03-26 DIAGNOSIS — M7752 Other enthesopathy of left foot: Secondary | ICD-10-CM | POA: Diagnosis not present

## 2023-03-26 DIAGNOSIS — M778 Other enthesopathies, not elsewhere classified: Secondary | ICD-10-CM | POA: Diagnosis not present

## 2023-03-26 DIAGNOSIS — Z8679 Personal history of other diseases of the circulatory system: Secondary | ICD-10-CM | POA: Insufficient documentation

## 2023-03-26 MED ORDER — TRIAMCINOLONE ACETONIDE 10 MG/ML IJ SUSP
10.0000 mg | Freq: Once | INTRAMUSCULAR | Status: AC
Start: 2023-03-26 — End: 2023-03-26
  Administered 2023-03-26: 10 mg via INTRA_ARTICULAR

## 2023-03-26 NOTE — Progress Notes (Signed)
Subjective:   Patient ID: Shannon Osborne, adult   DOB: 67 y.o.   MRN: [id number]   HPI Patient states she is having a lot of pain in her left joint and on her right she has been getting problems with the foot in general with history of wearing orthotics that she has not been wearing recently.  Patient does not smoke likes to be active   Review of Systems  All other systems reviewed and are negative.       Objective:  Physical Exam Vitals and nursing note reviewed.  Constitutional:      Appearance: She is well-developed.  Pulmonary:     Effort: Pulmonary effort is normal.  Musculoskeletal:        General: Normal range of motion.  Skin:    General: Skin is warm.  Neurological:     Mental Status: She is alert.     Neurovascular status intact muscle strength adequate range of motion adequate exquisite discomfort fourth metatarsal phalangeal joint left and on the right does have moderate discomfort into the ankle region with history of calcaneocuboid fusion.  Good digital perfusion well-oriented     Assessment:  Acute inflammation pain of the fourth MPJ left with the right showing discomfort in the ankle subtalar joint     Plan:  H&P reviewed both conditions.  The left is more acute on focusing on that today and I went ahead I anesthetized 60 mg like Marcaine mixture sterile prep done using sterile instrumentation I aspirated the fourth MPJ getting out a small amount of clear fluid injected quarter cc dexamethasone Kenalog and applied padding to offload weight.  For the right were going to just try some shoe gear modifications and no current treatment but right might require some at 1 time  X-rays indicate plate in place calcaneocuboid joint right with no indication stress fracture arthritis left

## 2023-03-27 ENCOUNTER — Other Ambulatory Visit: Payer: Self-pay | Admitting: Cardiology

## 2023-03-27 NOTE — Telephone Encounter (Signed)
Spoke with patient to schedule appointment with Dr. Cristal Deer (to establish care---former Dr. Shari Prows patient)--pt wants to wait until January to schedule --she states she is seeing a dietician next month and wants to work on lowering her triclycerides

## 2023-04-05 ENCOUNTER — Ambulatory Visit: Payer: Medicare Other | Admitting: Family Medicine

## 2023-04-08 ENCOUNTER — Encounter: Payer: Self-pay | Admitting: Family Medicine

## 2023-04-09 ENCOUNTER — Ambulatory Visit (INDEPENDENT_AMBULATORY_CARE_PROVIDER_SITE_OTHER): Payer: Medicare Other | Admitting: Podiatry

## 2023-04-09 ENCOUNTER — Encounter: Payer: Self-pay | Admitting: Podiatry

## 2023-04-09 ENCOUNTER — Other Ambulatory Visit: Payer: Medicare Other

## 2023-04-09 DIAGNOSIS — M7661 Achilles tendinitis, right leg: Secondary | ICD-10-CM

## 2023-04-09 DIAGNOSIS — M7752 Other enthesopathy of left foot: Secondary | ICD-10-CM

## 2023-04-09 NOTE — Progress Notes (Signed)
Subjective:   Patient ID: Shannon Osborne, adult   DOB: 67 y.o.   MRN: [id number]   HPI Patient states still having mild pain in the joint improved but still present with prolonged ambulation.  Stated she did get some bruising   ROS      Objective:  Physical Exam  Neurovascular status intact mild discomfort in the fourth MPJ left with no other pathology currently noted history of Planter fasciitis     Assessment:  Doing well post treatment for capsulitis does have generalized foot structural history as in history of orthotics     Plan:  Discussed using padding more frequently and I did go ahead today and I applied orthotic discussion and pedorthist casted for orthotics with met pads which will be utilized and deep heel seat.  Patient will be seen back when orthotics return

## 2023-04-12 ENCOUNTER — Other Ambulatory Visit: Payer: Self-pay | Admitting: Family

## 2023-04-12 MED ORDER — LOSARTAN POTASSIUM 25 MG PO TABS: 25.0000 mg | ORAL_TABLET | Freq: Every evening | ORAL | 1 refills | Status: DC

## 2023-04-12 NOTE — Telephone Encounter (Signed)
Please see pt msg and advise 

## 2023-04-25 ENCOUNTER — Other Ambulatory Visit: Payer: Self-pay | Admitting: Family Medicine

## 2023-04-25 DIAGNOSIS — M8580 Other specified disorders of bone density and structure, unspecified site: Secondary | ICD-10-CM

## 2023-04-25 DIAGNOSIS — Z780 Asymptomatic menopausal state: Secondary | ICD-10-CM

## 2023-05-31 ENCOUNTER — Ambulatory Visit: Payer: Medicare Other

## 2023-05-31 NOTE — Progress Notes (Signed)
Patient presents today to pick up custom molded foot orthotics, diagnosed with Capsulitis by Dr. Charlsie Merles.   Orthotics were dispensed and fit was satisfactory. Reviewed instructions for break-in and wear. Written instructions given to patient.  Patient will follow up as needed.   Addison Bailey Cped, CFo, CFm

## 2023-06-11 ENCOUNTER — Inpatient Hospital Stay: Payer: Medicare Other | Attending: Hematology and Oncology | Admitting: Hematology and Oncology

## 2023-06-11 VITALS — BP 136/79 | HR 80 | Temp 98.2°F | Resp 16 | Wt 141.1 lb

## 2023-06-11 DIAGNOSIS — Z853 Personal history of malignant neoplasm of breast: Secondary | ICD-10-CM | POA: Insufficient documentation

## 2023-06-11 DIAGNOSIS — Z08 Encounter for follow-up examination after completed treatment for malignant neoplasm: Secondary | ICD-10-CM | POA: Insufficient documentation

## 2023-06-11 NOTE — Progress Notes (Signed)
[facility] CONSULT NOTE  Patient Care Team: Willow Ora, MD as PCP - General (Family Medicine) Meriam Sprague, MD (Inactive) as PCP - Cardiology (Cardiology) Harmon Dun, MD as Referring Physician (Orthopedic Surgery) Stechschulte, Hyman Hopes, MD as Consulting Physician (Surgery)  CHIEF COMPLAINTS/PURPOSE OF CONSULTATION:  History of breast cancer  ASSESSMENT & PLAN:   This is a patient with a past medical history of right breast invasive ductal carcinoma, stage T2 N0 M0 with 2 specimens, first mass was triple negative with Ki-67 index of 54%, second mass was 2% ER positive, PR and HER2 negative with a Ki-67 of 34%.  She completed adjuvant chemotherapy with TAC, intolerant to Femara Arimidex and Aromasin, declined  tamoxifen.  She is BRCA1 mutation carrier and has had contralateral mastectomy and bilateral salpingo-oophorectomy.  She has been cancer free for the past 14 years.   Physical examination today status post bilateral mastectomy, no palpable masses concerning for recurrence.  No regional adenopathy. Overall at this time, there is less concern for breast cancer recurrence.  Given her BRCA1 mutation, she will continue annual follow-up with [place] and continue self examination and report any changes.  With regards to constipation, I have advised her to continue a probiotic and take laxative like she has been using, reported to her PCP and see if she needs to go back to see a gastroenterologist.  Her last colonoscopy was 3 years ago and it was remarked as normal. She expressed understanding of all the recommendations.  She will return to clinic in 1 year or sooner as needed  HISTORY OF PRESENTING ILLNESS:  Shannon Osborne 67 y.o. adult is here because of high risk for breast cncer.  This is a very pleasant 67 year old postmenopausal female patient with past medical history significant for right-sided breast cancer stage II, T2 N0 M0 invasive ductal carcinoma status post right  mastectomy and sentinel lymph node dissection.  2 specimens present 1 was grade 3, ER negative PR negative and HER2 negative with a high Ki-67 index of 54%, the second mass was grade 3 2% ER positive, PR negative, HER2 negative with Ki-67 at 34%.  She had chemotherapy with TAC x6 completed February 06, 2008.  She was intolerant to Femara and Arimidex and subsequently Aromasin.  She declined a trial of tamoxifen.  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  She was previously treated by Dr. Dewitt Hoes in [facility], moved to the [facility] and is establishing with [place] given 1 mutation. Her dad had 4 sisters with breast cancer.  There appears to be history of female breast cancer on her maternal side.  Interval History  Shannon Osborne is here for follow-up.   Since her last visit here, she has been doing quite well.  She had lower back spinal fusion and has been recovering well.  For her right chest wall sensitivity, she has been doing some physical therapy and has noticed significant improvement.  Currently she also reports some constipation which has been the most bothersome complaint to her in the past several weeks.  She has been trying stool softeners, laxatives.  She denies any other change in the stool habits, no hematochezia or melena or change in stool caliber overall.  No recent infections or antibiotic use to change the gut flora.  She has been taking a probiotic regularly.  Rest of the pertinent 10 point ROS reviewed and negative  MEDICAL HISTORY:  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   Breast cancer (HCC) 2009   BRACA 1 Pos   Chronic midline low back pain without sciatica 02/18/2020   Coronary artery disease    mild   COVID-19 10/20/2022   Family history of adverse reaction to anesthesia    mother is slow to wake up   Fibromyalgia     GERD (gastroesophageal reflux disease) 02/18/2020   Hyperlipemia    Hypertension    Mitral valve prolapse    never has caused any problems per patient 12/04/22   Multiple drug allergies 02/18/2020   Myalgia due to statin 02/18/2020   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   MRI 05/2018   Osteopenia after menopause 02/11/2021   DEXA 01/2021 lowest T = -1.7 femur; recheck 2-3 years   Pneumonia    x 1 - years ago   PONV (postoperative nausea and vomiting)    after most recent colonoscopy (2020)    SURGICAL HISTORY: Past Surgical History:  Procedure Laterality Date   ABDOMINAL HYSTERECTOMY  2002   BREAST SURGERY     right 09/10/2007, left 06/16/2009   colonosocpy     several   FOOT BONE EXCISION Right 2021   at [facility]  - fusion heel bone   SHOULDER ARTHROTOMY Left 2013   and 06/2015   TONSILLECTOMY  2012   TRANSFORAMINAL LUMBAR INTERBODY FUSION (TLIF) WITH PEDICLE SCREW FIXATION 1 LEVEL N/A 12/11/2022   Procedure: Lumbar five-Sacral one Laminectomy, Transforaminal Lumbar Interbody Fusion, posterolateral instrumented fusion;  Surgeon: Jadene Pierini, MD;  Location: [facility] OR;  Service: Neurosurgery;  Laterality: N/A;   UPPER GI ENDOSCOPY      SOCIAL HISTORY: Social History   Socioeconomic History   Marital status: Single    Spouse name: Not on file   Number of children: Not on file   Years of education: Not on file   Highest education level: Bachelor's degree (e.g., BA, AB, BS)  Occupational History   Not on file  Tobacco Use   Smoking status: Never    Passive exposure: Past   Smokeless tobacco: Never  Vaping Use   Vaping status: Never Used  Substance and Sexual Activity   Alcohol use: Yes    Comment: 10 drinks a year    Drug use: Never   Sexual activity: Yes    Partners: Male    Birth control/protection: Surgical    Comment: Hysterectomy  Other Topics Concern   Not on file  Social History Narrative   Not on file   Social Determinants of Health    Financial Resource Strain: Low Risk  (03/13/2023)   Overall Financial Resource Strain (CARDIA)    Difficulty of Paying Living Expenses: Not hard at all  Food Insecurity: No Food Insecurity (03/13/2023)   Hunger Vital Sign    Worried About Running Out of Food in the Last Year: Never true    Ran Out of Food in the Last Year: Never true  Transportation Needs: No Transportation Needs (03/13/2023)   PRAPARE - Administrator, Civil Service (Medical): No    Lack of Transportation (Non-Medical): No  Physical Activity: Insufficiently Active (03/13/2023)   Exercise Vital Sign    Days of Exercise per Week: 4 days    Minutes of Exercise per Session: 30 min  Stress: No Stress Concern Present (03/13/2023)   [employer] of Occupational Health - Occupational Stress Questionnaire  Feeling of Stress : Not at all  Social Connections: Socially Integrated (03/13/2023)   Social Connection and Isolation Panel [NHANES]    Frequency of Communication with Friends and Family: More than three times a week    Frequency of Social Gatherings with Friends and Family: Twice a week    Attends Religious Services: More than 4 times per year    Active Member of [employer] or Organizations: Yes    Attends Engineer, structural: More than 4 times per year    Marital Status: Living with partner  Intimate Partner Violence: Not At Risk (10/10/2022)   Humiliation, Afraid, Rape, and Kick questionnaire    Fear of Current or Ex-Partner: No    Emotionally Abused: No    Physically Abused: No    Sexually Abused: No    FAMILY HISTORY: Family History  Problem Relation Age of Onset   Asthma Mother    Hypertension Mother    Colon polyps Mother    Gallstones Mother    Allergic rhinitis Father    Stroke Father    Hypertension Brother    Gallstones Brother    Hypertension Brother    Gallstones Brother    Hypertension Brother    Gallstones Brother     ALLERGIES:  is allergic to baclofen, barley grass,  buckwheat, cyclobenzaprine, escitalopram oxalate, iodine, lyrica [pregabalin], other, rosuvastatin, sertraline, simvastatin, vanilla, atorvastatin, codeine, contrast media [iodinated contrast media], hydrochlorothiazide w-triamterene, hydrocodone-acetaminophen, maxzide [triamterene-hctz], and oxycodone hcl.  MEDICATIONS:  Current Outpatient Medications  Medication Sig Dispense Refill   aspirin EC 81 MG tablet Take 1 tablet (81 mg total) by mouth daily. Swallow whole. 30 tablet 11   azelastine (ASTELIN) 0.1 % nasal spray Place 1 spray into both nostrils 2 (two) times daily. Use in each nostril as directed     carisoprodol (SOMA) 350 MG tablet Take 1 tablet (350 mg total) by mouth 3 (three) times daily as needed for muscle spasms. 90 tablet 0   cholecalciferol (VITAMIN D3) 25 MCG (1000 UNIT) tablet Take 1,000 Units by mouth 2 (two) times daily.     Evolocumab (REPATHA SURECLICK) 140 MG/ML SOAJ Inject 140 mg into the skin every 14 (fourteen) days. 2 mL 11   ezetimibe (ZETIA) 10 MG tablet TAKE 1 TABLET AT BEDTIME 90 tablet 3   fluticasone (FLONASE SENSIMIST) 27.5 MCG/SPRAY nasal spray Place 2 sprays into the nose daily as needed for rhinitis or allergies.     icosapent Ethyl (VASCEPA) 1 g capsule Take 2 capsules (2 g total) by mouth 2 (two) times daily. 360 capsule 3   losartan (COZAAR) 25 MG tablet Take 1 tablet (25 mg total) by mouth every evening. 90 tablet 1   melatonin 3 MG TABS tablet Take 3 mg by mouth at bedtime.     metoprolol succinate (TOPROL-XL) 25 MG 24 hr tablet TAKE 1 TABLET BY MOUTH EVERY DAY 90 tablet 1   Multiple Vitamin (MULTIVITAMIN WITH MINERALS) TABS tablet Take 1 tablet by mouth daily.     Omega-3 Fatty Acids (OMEGA 3 PO) Take 1 capsule by mouth daily.     omeprazole (PRILOSEC) 20 MG capsule Take 1 capsule (20 mg total) by mouth daily.     pregabalin (LYRICA) 25 MG capsule Take 25 mg by mouth 2 (two) times daily.     traZODone (DESYREL) 50 MG tablet Take 50 mg by mouth at  bedtime as needed for sleep.     triamcinolone cream (KENALOG) 0.1 % Apply 1 Application topically 2 (two)  times daily as needed. 454 g 0   No current facility-administered medications for this visit.     PHYSICAL EXAMINATION: ECOG PERFORMANCE STATUS: 0 - Asymptomatic  Vitals:   06/11/23 0914  BP: 136/79  Pulse: 80  Resp: 16  Temp: 98.2 F (36.8 C)  SpO2: 99%    Filed Weights   06/11/23 0914  Weight: 141 lb 1.6 oz (64 kg)   GENERAL:alert, no distress and comfortable Neck: No cervical adenopathy Breast:  Status post bilateral mastectomy.  No concern for recurrence.  No palpable masses or regional adenopathy.  Trace bilateral lower extremity swelling.  LABORATORY DATA:  I have reviewed the data as listed Lab Results  Component Value Date   WBC 4.9 03/12/2023   HGB 12.0 03/12/2023   HCT 36.5 03/12/2023   MCV 89.1 03/12/2023   PLT 250.0 03/12/2023     Chemistry      Component Value Date/Time   NA 138 03/12/2023 0852   NA 142 09/20/2022 0800   K 4.5 03/12/2023 0852   CL 100 03/12/2023 0852   CO2 29 03/12/2023 0852   BUN 16 03/12/2023 0852   BUN 17 09/20/2022 0800   CREATININE 0.65 03/12/2023 0852   GLU 89 07/07/2019 0000      Component Value Date/Time   CALCIUM 9.3 03/12/2023 0852   ALKPHOS 80 03/12/2023 0852   AST 23 03/12/2023 0852   ALT 22 03/12/2023 0852   BILITOT 0.4 03/12/2023 0852   BILITOT 0.4 12/05/2022 0000       RADIOGRAPHIC STUDIES: I have personally reviewed the radiological images as listed and agreed with the findings in the report. No results found.  All questions were answered. The patient knows to call the clinic with any problems, questions or concerns. I spent in the care of this patient including H and P, review of records, counseling and coordination of care.     Rachel Moulds, MD 06/11/2023 9:27 AM

## 2023-06-15 ENCOUNTER — Encounter: Payer: Self-pay | Admitting: Family Medicine

## 2023-06-15 DIAGNOSIS — K5900 Constipation, unspecified: Secondary | ICD-10-CM

## 2023-06-18 ENCOUNTER — Ambulatory Visit (INDEPENDENT_AMBULATORY_CARE_PROVIDER_SITE_OTHER): Payer: Medicare Other | Admitting: Physician Assistant

## 2023-06-18 ENCOUNTER — Encounter: Payer: Self-pay | Admitting: Physician Assistant

## 2023-06-18 VITALS — BP 120/80 | HR 69 | Temp 98.2°F | Ht 59.0 in | Wt 135.0 lb

## 2023-06-18 DIAGNOSIS — K5900 Constipation, unspecified: Secondary | ICD-10-CM | POA: Diagnosis not present

## 2023-06-18 NOTE — Progress Notes (Signed)
Shannon Osborne is a 67 y.o. adult here for a follow up of a pre-existing problem.  History of Present Illness:   Chief Complaint  Patient presents with  . Constipation    Pt c/o constipation about 4 months ago, has been using prunes, miralax and fleets enema, did enema and miralax yesterday and had diarrhea.    HPI  Constipation: She complains of constipation for about 4-5 months.  Reports the frequency of bowel movements has decreased, straining has increased, and the caliber of her stool has also changed.  Previously she would have regular bowel movements (type 3 and 4) every other day.  When her constipation first started she was drinking about 3 oz of prune juice daily, which helped at the time but is not currently working.  Since then she has also tried Miralax daily with her coffee.  Stopped taking Miralax daily due to nausea, now taking every other day.  Was having small soft bowels when taking dulcolax and Miralax.  Has also tried suppositories and fleet enemas.  Endorses flatulence but notes today she has had the least activity.  Only ate a slice of bread yesterday and today, can't eat anything else.  Drinking about 12 glasses of water a day.  Yesterday had external bleeding, which she thinks is a result of excessive wiping after enema and miralax.  Has come off of any medications to see if her sx are attributed to her meds.  Has hx of hemorrhoids. Has had abdominal hysterectomy, no other surgeries.  Is not established with GI and cannot be seen without a referral.  Last colonoscopy reviewed -- she is UpToDate and needs to follow up 2026 for this  Past Medical History:  Diagnosis Date  . Anemia   . 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  . Breast cancer (HCC) 2009   BRACA 1 Pos  . Chronic midline low back pain without sciatica 02/18/2020  . Coronary artery disease    mild  . COVID-19 10/20/2022  . Family history of adverse reaction to anesthesia    mother is slow to  wake up  . Fibromyalgia   . GERD (gastroesophageal reflux disease) 02/18/2020  . Hyperlipemia   . Hypertension   . Mitral valve prolapse    never has caused any problems per patient 12/04/22  . Multiple drug allergies 02/18/2020  . Myalgia due to statin 02/18/2020  . 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   MRI 05/2018  . Osteopenia after menopause 02/11/2021   DEXA 01/2021 lowest T = -1.7 femur; recheck 2-3 years  . Pneumonia    x 1 - years ago  . PONV (postoperative nausea and vomiting)    after most recent colonoscopy (2020)     Social History   Tobacco Use  . Smoking status: Never    Passive exposure: Past  . Smokeless tobacco: Never  Vaping Use  . Vaping status: Never Used  Substance Use Topics  . Alcohol use: Yes    Comment: 10 drinks a year   . Drug use: Never    Past Surgical History:  Procedure Laterality Date  . ABDOMINAL HYSTERECTOMY  2002  . BREAST SURGERY     right 09/10/2007, left 06/16/2009  . colonosocpy     several  . FOOT BONE EXCISION Right 2021   at [facility]  - fusion heel bone  . SHOULDER ARTHROTOMY Left 2013   and 06/2015  . TONSILLECTOMY  2012  . TRANSFORAMINAL LUMBAR INTERBODY FUSION (TLIF) WITH PEDICLE SCREW FIXATION 1  LEVEL N/A 12/11/2022   Procedure: Lumbar five-Sacral one Laminectomy, Transforaminal Lumbar Interbody Fusion, posterolateral instrumented fusion;  Surgeon: Jadene Pierini, MD;  Location: [facility] OR;  Service: Neurosurgery;  Laterality: N/A;  . UPPER GI ENDOSCOPY      Family History  Problem Relation Age of Onset  . Asthma Mother   . Hypertension Mother   . Colon polyps Mother   . Gallstones Mother   . Allergic rhinitis Father   . Stroke Father   . Hypertension Brother   . Gallstones Brother   . Hypertension Brother   . Gallstones Brother   . Hypertension Brother   . Gallstones Brother     Allergies  Allergen Reactions  . Baclofen Other (See Comments)    insomnia  . Barley Grass Cough  . Buckwheat Cough  .  Cyclobenzaprine Other (See Comments)    Insomnia  . Escitalopram Oxalate Other (See Comments)    Unknown reaction   . Iodine Nausea And Vomiting  . Lyrica [Pregabalin]     Couldn't focus  . Other     Rye - cough Beer - sinus congestion  E953 isomalt - cough  . Rosuvastatin Other (See Comments)    myalgia  . Sertraline Other (See Comments)    Makes depression worse  . Simvastatin Other (See Comments)    myalgia  . Vanilla Cough  . Atorvastatin Other (See Comments)    myalgia  . Codeine Rash  . Contrast Media [Iodinated Contrast Media] Rash    Chest pressure   . Hydrochlorothiazide W-Triamterene Rash  . Hydrocodone-Acetaminophen Rash  . Maxzide [Triamterene-Hctz] Rash  . Oxycodone Hcl Rash    Patient took in 2024 after surgery with no rash.  . Tape Itching, Rash and Swelling    Current Medications:   Current Outpatient Medications:  .  aspirin EC 81 MG tablet, Take 1 tablet (81 mg total) by mouth daily. Swallow whole., Disp: 30 tablet, Rfl: 11 .  azelastine (ASTELIN) 0.1 % nasal spray, Place 1 spray into both nostrils 2 (two) times daily. Use in each nostril as directed, Disp: , Rfl:  .  carisoprodol (SOMA) 350 MG tablet, Take 1 tablet (350 mg total) by mouth 3 (three) times daily as needed for muscle spasms., Disp: 90 tablet, Rfl: 0 .  cholecalciferol (VITAMIN D3) 25 MCG (1000 UNIT) tablet, Take 1,000 Units by mouth 2 (two) times daily., Disp: , Rfl:  .  Evolocumab (REPATHA SURECLICK) 140 MG/ML SOAJ, Inject 140 mg into the skin every 14 (fourteen) days., Disp: 2 mL, Rfl: 11 .  ezetimibe (ZETIA) 10 MG tablet, TAKE 1 TABLET AT BEDTIME, Disp: 90 tablet, Rfl: 3 .  fluticasone (FLONASE SENSIMIST) 27.5 MCG/SPRAY nasal spray, Place 2 sprays into the nose daily as needed for rhinitis or allergies., Disp: , Rfl:  .  icosapent Ethyl (VASCEPA) 1 g capsule, Take 2 capsules (2 g total) by mouth 2 (two) times daily., Disp: 360 capsule, Rfl: 3 .  losartan (COZAAR) 25 MG tablet, Take 1  tablet (25 mg total) by mouth every evening., Disp: 90 tablet, Rfl: 1 .  melatonin 3 MG TABS tablet, Take 3 mg by mouth at bedtime., Disp: , Rfl:  .  metoprolol succinate (TOPROL-XL) 25 MG 24 hr tablet, TAKE 1 TABLET BY MOUTH EVERY DAY, Disp: 90 tablet, Rfl: 1 .  Multiple Vitamin (MULTIVITAMIN WITH MINERALS) TABS tablet, Take 1 tablet by mouth daily., Disp: , Rfl:  .  Omega-3 Fatty Acids (OMEGA 3 PO), Take 1 capsule by mouth  daily., Disp: , Rfl:  .  omeprazole (PRILOSEC) 20 MG capsule, Take 1 capsule (20 mg total) by mouth daily., Disp: , Rfl:  .  OVER THE COUNTER MEDICATION, Take 1 tablet by mouth 2 (two) times daily. Vivscal [employer], Disp: , Rfl:  .  pregabalin (LYRICA) 25 MG capsule, Take 25 mg by mouth 2 (two) times daily., Disp: , Rfl:  .  traZODone (DESYREL) 50 MG tablet, Take 50 mg by mouth at bedtime as needed for sleep., Disp: , Rfl:  .  triamcinolone cream (KENALOG) 0.1 %, Apply 1 Application topically 2 (two) times daily as needed., Disp: 454 g, Rfl: 0   Review of Systems:   ROS Negative unless otherwise specified per HPI.  Vitals:   Vitals:   06/18/23 1405  BP: 120/80  Pulse: 69  Temp: 98.2 F (36.8 C)  TempSrc: Temporal  SpO2: 97%  Weight: 135 lb (61.2 kg)  Height: 4\' 11"  (1.499 m)     Body mass index is 27.27 kg/m.  Physical Exam:   Physical Exam Vitals and nursing note reviewed.  Constitutional:      General: She is not in acute distress.    Appearance: She is well-developed. She is not ill-appearing or toxic-appearing.  Cardiovascular:     Rate and Rhythm: Normal rate and regular rhythm.     Pulses: Normal pulses.     Heart sounds: Normal heart sounds, S1 normal and S2 normal.  Pulmonary:     Effort: Pulmonary effort is normal.     Breath sounds: Normal breath sounds.  Abdominal:     General: Abdomen is flat.     Palpations: Abdomen is soft.     Tenderness: There is no abdominal tenderness. There is no guarding or rebound.  Skin:     General: Skin is warm and dry.  Neurological:     Mental Status: She is alert.     GCS: GCS eye subscore is 4. GCS verbal subscore is 5. GCS motor subscore is 6.  Psychiatric:        Speech: Speech normal.        Behavior: Behavior normal. Behavior is cooperative.     Assessment and Plan:   Constipation, unspecified constipation type No red flags; no acute abdomen Discussed need for more consistent bowel regimen -- including miralax daily up to 4 capfuls daily; add in stimulant laxative such as dulcolax 1-2 doses daily as needed Referral to gastroenterology per patient request Worsening precautions advised in the interim Push fluids Constipation handout provided  I, Isabelle Course, acting as a Neurosurgeon for Jarold Motto, [place]., have documented all relevant documentation on the behalf of Jarold Motto, [place], as directed by  Jarold Motto, PA while in the presence of Jarold Motto, [place].  I, Isabelle Course, have reviewed all documentation for this visit. The documentation on 06/18/23 for the exam, diagnosis, procedures, and orders are all accurate and complete.  Jarold Motto, PA-C

## 2023-06-18 NOTE — Patient Instructions (Addendum)
It was great to see you!  To treat your constipation today: -Drink at least 64 oz of water daily -Add in 1 capful of polyethylene glycol (also known as Miralax, however generic is fine!) to beverage of choice daily -After a few days if no success, may increase by 1 capful every few days to a total of 4 capfuls of polyethylene glycol/ Miralax daily   Also add in dulcolax stimulant daily as needed  If still no results, please call the office   I will place referral to Dr Kenna Gilbert office in the meantime    Contact a doctor if: You have pain that gets worse. You have a fever. You have not pooped for 4 days. You vomit. You are not hungry. You lose weight. You are bleeding from the opening of the butt (anus). You have thin, pencil-like poop. Get help right away if: You have a fever, and your symptoms suddenly get worse. You leak poop or have blood in your poop. Your belly feels hard or bigger than normal (bloated). You have very bad belly pain. You feel dizzy or you faint. Summary Constipation is when a person poops fewer than 3 times a week, has trouble pooping, or has poop that is dry, hard, or bigger than normal. Eat foods that have a lot of fiber. Drink enough fluid to keep your pee (urine) pale yellow. Take over-the-counter and prescription medicines only as told by your doctor. These include any fiber supplements. This information is not intended to replace advice given to you by your health care provider. Make sure you discuss any questions you have with your health care provider.  Take care,  Jarold Motto PA-C

## 2023-06-19 ENCOUNTER — Ambulatory Visit: Payer: Medicare Other | Admitting: Family Medicine

## 2023-06-20 ENCOUNTER — Encounter: Payer: Self-pay | Admitting: Physician Assistant

## 2023-06-21 ENCOUNTER — Ambulatory Visit (INDEPENDENT_AMBULATORY_CARE_PROVIDER_SITE_OTHER)
Admission: RE | Admit: 2023-06-21 | Discharge: 2023-06-21 | Disposition: A | Discharge status: Home / Self Care | Payer: Medicare Other | Source: Ambulatory Visit | Attending: Family Medicine | Admitting: Family Medicine

## 2023-06-21 DIAGNOSIS — K5900 Constipation, unspecified: Secondary | ICD-10-CM

## 2023-06-22 ENCOUNTER — Other Ambulatory Visit: Payer: Self-pay

## 2023-06-22 ENCOUNTER — Telehealth: Payer: Self-pay

## 2023-06-22 NOTE — Telephone Encounter (Signed)
I spoke with pt today and informed pt that we are unable to receive all notes from Dr Molli Hazard at [facility]. Pt stated that has only had colonoscopies there and was seen one time for hemorrhoids in which they were cauterized. Pt stated that she has not been seen for any otther issues

## 2023-06-29 ENCOUNTER — Other Ambulatory Visit: Payer: Medicare Other

## 2023-07-05 ENCOUNTER — Telehealth: Payer: Self-pay | Admitting: Family Medicine

## 2023-07-05 ENCOUNTER — Ambulatory Visit: Payer: Medicare Other

## 2023-07-05 NOTE — Progress Notes (Signed)
Patient presents for 2nd pair of orthotics  Is very happy with cork base she received from [place] in October I did narrow the orthotics a bit to slide better into her shoes  Order placed with [employer] today       Cost $271 patient will pay at pick up

## 2023-07-05 NOTE — Telephone Encounter (Signed)
Pt Shannon Osborne [place] be new patients with Dr. Lawerance Bach. Please advise

## 2023-07-05 NOTE — Telephone Encounter (Signed)
Yes ok for both 

## 2023-07-08 ENCOUNTER — Encounter: Payer: Self-pay | Admitting: Family Medicine

## 2023-07-13 ENCOUNTER — Telehealth: Payer: Self-pay | Admitting: Family Medicine

## 2023-07-13 NOTE — Telephone Encounter (Signed)
Shannon Osborne returned my call regarding referral to [facility]. The office said they are unable to see her as their patient- referral was denied.  Pt said she is going to [place] for a week and when she returns she will call back and let us know who she would like this referral sent to. We discussed [facility] and talked about someof the providers there. She wants to talk to her friends to see who they recommend first.

## 2023-07-24 NOTE — Progress Notes (Signed)
See mychart note Dear Shannon Osborne, I see you were in and saw Shannon Osborne and will be seeing GI soon. Your xray is unremarkable and there is no evidence of increased stool. I hope you are feeling better.   I see you are transferring your care to Dr. Lawerance Bach. She will be excellent for you.  Take care and let me know if you need anything.  Happy Holidays.  Sincerely, Dr. Mardelle Matte

## 2023-08-28 ENCOUNTER — Telehealth: Payer: Self-pay | Admitting: Cardiology

## 2023-08-28 MED ORDER — REPATHA SURECLICK 140 MG/ML ~~LOC~~ SOAJ: 140.0000 mg | SUBCUTANEOUS | 3 refills | Status: DC

## 2023-08-28 NOTE — Telephone Encounter (Signed)
°*  STAT* If patient is at the pharmacy, call can be transferred to refill team.   1. Which medications need to be refilled? (please list name of each medication and dose if known) Evolocumab (REPATHA SURECLICK) 485 MG/ML SOAJ  2. Which pharmacy/location (including street and city if local pharmacy) is medication to be sent to? [id number] - [place], [place] - [address]  3. Do they need a 30 day or 90 day supply? [place]

## 2023-08-29 DIAGNOSIS — M722 Plantar fascial fibromatosis: Secondary | ICD-10-CM

## 2023-09-09 ENCOUNTER — Encounter: Payer: Self-pay | Admitting: Internal Medicine

## 2023-09-09 NOTE — Patient Instructions (Addendum)
   It was nice to meet you.   Blood work was ordered.   Have a chest xray today.     Medications changes include :   None    A referral was ordered [facility] and someone will call you to schedule an appointment.     Return in about 6 months (around 03/09/2024) for follow up.

## 2023-09-09 NOTE — Progress Notes (Unsigned)
Subjective:    Patient ID: Shannon Osborne, female    DOB: Feb 16, 1956, 68 y.o.   MRN: [id number]     HPI Shannon Osborne is here for follow up of her chronic medical problems.  She is here to establish with a new pcp.    Working on weight loss, eating better and exercising-goes to [employer].    Burning and tingling in feet in right foot from L4-5 started after spine surgery - L5-S1 fusion   Has hemorrhoids,  has achiness in one area.  Takes miralax.  No more constipation.   Stress incontinence - when cough and sneeze.   Sometimes ribs dislocate bc of back  - sometmes get chest pain,  occ gets gerdthat causes chest pain up to jaw      Medications and allergies reviewed with patient and updated if appropriate.  Current Outpatient Medications on File Prior to Visit  Medication Sig Dispense Refill   aspirin EC 81 MG tablet Take 1 tablet (81 mg total) by mouth daily. Swallow whole. 30 tablet 11   azelastine (ASTELIN) 0.1 % nasal spray Place 1 spray into both nostrils 2 (two) times daily. Use in each nostril as directed     carisoprodol (SOMA) 350 MG tablet Take 1 tablet (350 mg total) by mouth 3 (three) times daily as needed for muscle spasms. 90 tablet 0   cholecalciferol (VITAMIN D3) 25 MCG (1000 UNIT) tablet Take 1,000 Units by mouth 2 (two) times daily.     Evolocumab (REPATHA SURECLICK) 140 MG/ML SOAJ Inject 140 mg into the skin every 14 (fourteen) days. 6 mL 3   ezetimibe (ZETIA) 10 MG tablet TAKE 1 TABLET AT BEDTIME 90 tablet 3   fluticasone (FLONASE SENSIMIST) 27.5 MCG/SPRAY nasal spray Place 2 sprays into the nose daily as needed for rhinitis or allergies.     icosapent Ethyl (VASCEPA) 1 g capsule Take 2 capsules (2 g total) by mouth 2 (two) times daily. 360 capsule 3   losartan (COZAAR) 25 MG tablet Take 1 tablet (25 mg total) by mouth every evening. 90 tablet 1   melatonin 3 MG TABS tablet Take 3 mg by mouth at bedtime.     metoprolol succinate (TOPROL-XL) 25 MG 24 hr tablet TAKE 1 TABLET  BY MOUTH EVERY DAY 90 tablet 1   Multiple Vitamin (MULTIVITAMIN WITH MINERALS) TABS tablet Take 1 tablet by mouth daily.     Omega-3 Fatty Acids (OMEGA 3 PO) Take 1 capsule by mouth daily.     omeprazole (PRILOSEC) 20 MG capsule Take 1 capsule (20 mg total) by mouth daily.     OVER THE COUNTER MEDICATION Take 1 tablet by mouth 2 (two) times daily. Vivscal [facility]     triamcinolone cream (KENALOG) 0.1 % Apply 1 Application topically 2 (two) times daily as needed. 454 g 0   No current facility-administered medications on file prior to visit.     Review of Systems  Constitutional:  Negative for fever.  Respiratory:  Negative for cough, shortness of breath and wheezing.   Cardiovascular:  Negative for chest pain, palpitations and leg swelling.  Gastrointestinal:  Negative for abdominal pain, blood in stool, constipation and nausea.       Gerd controlled  Genitourinary:  Negative for dysuria and hematuria.  Musculoskeletal:  Positive for arthralgias (hand OA) and back pain.  Neurological:  Negative for light-headedness and headaches.  Psychiatric/Behavioral:  Negative for dysphoric mood. The patient is not nervous/anxious.  Objective:   Vitals:   09/10/23 1052  BP: 120/74  Pulse: 67  Temp: 97.7 F (36.5 C)  SpO2: 97%   BP Readings from Last 3 Encounters:  09/10/23 120/74  06/18/23 120/80  06/11/23 136/79   Wt Readings from Last 3 Encounters:  09/10/23 143 lb (64.9 kg)  06/18/23 135 lb (61.2 kg)  06/11/23 141 lb 1.6 oz (64 kg)   Body mass index is 28.88 kg/m.    Physical Exam Constitutional:      General: She is not in acute distress.    Appearance: Normal appearance.  HENT:     Head: Normocephalic and atraumatic.  Eyes:     Conjunctiva/sclera: Conjunctivae normal.  Cardiovascular:     Rate and Rhythm: Normal rate and regular rhythm.     Heart sounds: Normal heart sounds.  Pulmonary:     Effort: Pulmonary effort is normal. No respiratory  distress.     Breath sounds: Normal breath sounds. No wheezing.  Musculoskeletal:     Cervical back: Neck supple.     Right lower leg: No edema.     Left lower leg: No edema.  Lymphadenopathy:     Cervical: No cervical adenopathy.  Skin:    General: Skin is warm and dry.     Findings: No rash.  Neurological:     Mental Status: She is alert. Mental status is at baseline.  Psychiatric:        Mood and Affect: Mood normal.        Behavior: Behavior normal.        Lab Results  Component Value Date   WBC 4.9 03/12/2023   HGB 12.0 03/12/2023   HCT 36.5 03/12/2023   PLT 250.0 03/12/2023   GLUCOSE 102 (H) 03/12/2023   CHOL 145 03/12/2023   CHOL 141 03/12/2023   TRIG 319 (H) 03/12/2023   TRIG 321.0 (H) 03/12/2023   HDL 44 03/12/2023   HDL 45.30 03/12/2023   LDLDIRECT 51.0 03/12/2023   LDLCALC 52 03/12/2023   ALT 22 03/12/2023   AST 23 03/12/2023   NA 138 03/12/2023   K 4.5 03/12/2023   CL 100 03/12/2023   CREATININE 0.65 03/12/2023   BUN 16 03/12/2023   CO2 29 03/12/2023   TSH 2.93 03/12/2023     Assessment & Plan:    See Problem List for Assessment and Plan of chronic medical problems.

## 2023-09-10 ENCOUNTER — Ambulatory Visit (INDEPENDENT_AMBULATORY_CARE_PROVIDER_SITE_OTHER): Payer: Medicare Other | Admitting: Internal Medicine

## 2023-09-10 ENCOUNTER — Ambulatory Visit (INDEPENDENT_AMBULATORY_CARE_PROVIDER_SITE_OTHER): Payer: Medicare Other

## 2023-09-10 ENCOUNTER — Encounter: Payer: Self-pay | Admitting: Internal Medicine

## 2023-09-10 VITALS — BP 120/74 | HR 67 | Temp 97.7°F | Ht 59.0 in | Wt 143.0 lb

## 2023-09-10 DIAGNOSIS — Z77090 Contact with and (suspected) exposure to asbestos: Secondary | ICD-10-CM

## 2023-09-10 DIAGNOSIS — I2510 Atherosclerotic heart disease of native coronary artery without angina pectoris: Secondary | ICD-10-CM | POA: Diagnosis not present

## 2023-09-10 DIAGNOSIS — R739 Hyperglycemia, unspecified: Secondary | ICD-10-CM

## 2023-09-10 DIAGNOSIS — I739 Peripheral vascular disease, unspecified: Secondary | ICD-10-CM | POA: Insufficient documentation

## 2023-09-10 DIAGNOSIS — M5416 Radiculopathy, lumbar region: Secondary | ICD-10-CM | POA: Insufficient documentation

## 2023-09-10 DIAGNOSIS — T466X5A Adverse effect of antihyperlipidemic and antiarteriosclerotic drugs, initial encounter: Secondary | ICD-10-CM

## 2023-09-10 DIAGNOSIS — I10 Essential (primary) hypertension: Secondary | ICD-10-CM | POA: Diagnosis not present

## 2023-09-10 DIAGNOSIS — Z853 Personal history of malignant neoplasm of breast: Secondary | ICD-10-CM

## 2023-09-10 DIAGNOSIS — Z981 Arthrodesis status: Secondary | ICD-10-CM | POA: Insufficient documentation

## 2023-09-10 DIAGNOSIS — M609 Myositis, unspecified: Secondary | ICD-10-CM | POA: Insufficient documentation

## 2023-09-10 DIAGNOSIS — E782 Mixed hyperlipidemia: Secondary | ICD-10-CM | POA: Diagnosis not present

## 2023-09-10 DIAGNOSIS — R252 Cramp and spasm: Secondary | ICD-10-CM | POA: Insufficient documentation

## 2023-09-10 DIAGNOSIS — K219 Gastro-esophageal reflux disease without esophagitis: Secondary | ICD-10-CM

## 2023-09-10 DIAGNOSIS — R7303 Prediabetes: Secondary | ICD-10-CM | POA: Insufficient documentation

## 2023-09-10 DIAGNOSIS — N393 Stress incontinence (female) (male): Secondary | ICD-10-CM

## 2023-09-10 LAB — LIPID PANEL
Cholesterol: 157 mg/dL (ref 0–200)
HDL: 59.6 mg/dL (ref >39.00)
LDL Cholesterol: 51 mg/dL (ref 0–99)
NonHDL: 97
Total CHOL/HDL Ratio: 3
Triglycerides: 229 mg/dL — ABNORMAL HIGH (ref 0.0–149.0)
VLDL: 45.8 mg/dL — ABNORMAL HIGH (ref 0.0–40.0)

## 2023-09-10 LAB — COMPREHENSIVE METABOLIC PANEL
ALT: 21 U/L (ref 0–35)
AST: 25 U/L (ref 0–37)
Albumin: 4.8 g/dL (ref 3.5–5.2)
Alkaline Phosphatase: 72 U/L (ref 39–117)
BUN: 19 mg/dL (ref 6–23)
CO2: 29 meq/L (ref 19–32)
Calcium: 9.7 mg/dL (ref 8.4–10.5)
Chloride: 101 meq/L (ref 96–112)
Creatinine, Ser: 0.56 mg/dL (ref 0.40–1.20)
GFR: 92.93 mL/min (ref >60.00)
Glucose, Bld: 92 mg/dL (ref 70–99)
Potassium: 4 meq/L (ref 3.5–5.1)
Sodium: 138 meq/L (ref 135–145)
Total Bilirubin: 0.4 mg/dL (ref 0.2–1.2)
Total Protein: 7.7 g/dL (ref 6.0–8.3)

## 2023-09-10 LAB — CBC WITH DIFFERENTIAL/PLATELET
Basophils Absolute: 0 10*3/uL (ref 0.0–0.1)
Basophils Relative: 0.9 % (ref 0.0–3.0)
Eosinophils Absolute: 0.1 10*3/uL (ref 0.0–0.7)
Eosinophils Relative: 1.2 % (ref 0.0–5.0)
HCT: 37.4 % (ref 36.0–46.0)
Hemoglobin: 12.4 g/dL (ref 12.0–15.0)
Lymphocytes Relative: 37.1 % (ref 12.0–46.0)
Lymphs Abs: 2.1 10*3/uL (ref 0.7–4.0)
MCHC: 33.2 g/dL (ref 30.0–36.0)
MCV: 90.3 fL (ref 78.0–100.0)
Monocytes Absolute: 0.3 10*3/uL (ref 0.1–1.0)
Monocytes Relative: 5.9 % (ref 3.0–12.0)
Neutro Abs: 3 10*3/uL (ref 1.4–7.7)
Neutrophils Relative %: 54.9 % (ref 43.0–77.0)
Platelets: 279 10*3/uL (ref 150.0–400.0)
RBC: 4.14 Mil/uL (ref 3.87–5.11)
RDW: 12.2 % (ref 11.5–15.5)
WBC: 5.5 10*3/uL (ref 4.0–10.5)

## 2023-09-10 LAB — HEMOGLOBIN A1C: Hgb A1c MFr Bld: 6.1 % (ref 4.6–6.5)

## 2023-09-10 MED ORDER — PREGABALIN 25 MG PO CAPS: 25.0000 mg | ORAL_CAPSULE | Freq: Two times a day (BID) | ORAL | Status: AC | PRN

## 2023-09-10 NOTE — Assessment & Plan Note (Addendum)
Chronic GERD controlled Does not drink any soda, cholate, tea Continue omeprazole 20 mg daily prn

## 2023-09-10 NOTE — Assessment & Plan Note (Addendum)
Chronic Check a1c Low sugar / carb diet Stressed regular exercise  

## 2023-09-10 NOTE — Assessment & Plan Note (Addendum)
Chronic Blood pressure well controlled CMP Continue metoprolol XL 25 mg daily, losartan 25 mg daily-okay to take both medications at the same time at night

## 2023-09-10 NOTE — Assessment & Plan Note (Signed)
Chronic Following with cardiology-Dr. Cristal Deer Regular exercise and healthy diet encouraged Check lipid panel, CMP Continue Repatha, Zetia

## 2023-09-10 NOTE — Assessment & Plan Note (Signed)
History of asbestos exposure Chest x-ray recommended annually-had CT of chest 1 year ago Will order chest x-ray and she will have that done today No concerning symptoms

## 2023-09-10 NOTE — Assessment & Plan Note (Signed)
History of breast cancer BRCA1 gene mutation S/p bilateral mastectomies To establish with Dr. Al Pimple

## 2023-09-10 NOTE — Assessment & Plan Note (Signed)
Chronic Following with cardiology-Dr. Cristal Deer Denies any symptoms consistent with angina BP well-controlled Continue aspirin 81 mg daily, Repatha q. 14 days, Zetia, metoprolol

## 2023-09-10 NOTE — Assessment & Plan Note (Signed)
Chronic Intolerant of statins Has CAD On Repatha, Zetia and LDL well-controlled

## 2023-09-10 NOTE — Assessment & Plan Note (Signed)
Has started experiencing increased stress incontinence when she sneezes or coughs She is interested in physical therapy Referral ordered for pelvic PT Advised to continue to control her constipation well since back then affect incontinence

## 2023-09-10 NOTE — Assessment & Plan Note (Addendum)
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

## 2023-09-10 NOTE — Assessment & Plan Note (Addendum)
Chronic Intermittent, infrequent  No obvious cause Can be lower legs, back muscles Likely related to her back issues Currently seeing a chiropractor monthly for maintenance Takes soma as needed-I will prescribe when needed

## 2023-09-11 ENCOUNTER — Other Ambulatory Visit: Payer: Self-pay | Admitting: Family Medicine

## 2023-09-13 ENCOUNTER — Encounter: Payer: Self-pay | Admitting: Internal Medicine

## 2023-09-18 ENCOUNTER — Other Ambulatory Visit: Payer: Self-pay | Admitting: Pharmacist Clinician (PhC)/ Clinical Pharmacy Specialist

## 2023-09-18 IMAGING — CT CT MAXILLOFACIAL W/O CM
1 series · 15 of 30 positions shown, 19 images · non-contrast
Comparison: None.

CLINICAL DATA: Neuralgia. Right-sided facial pain and sinus
congestion. Right tinnitus.



[Series 2: facialbone 2.0 st · axial · 0.35mm/px · z∈[-276,-104]mm · 15 of 94 slices shown, 19 images]
[im 4/94  brain]
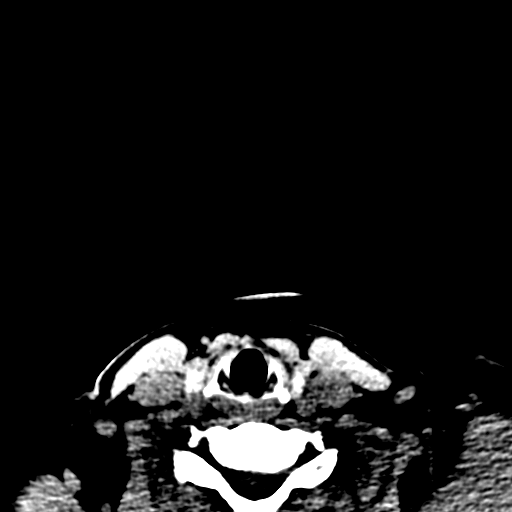
[im 4/94  bone]
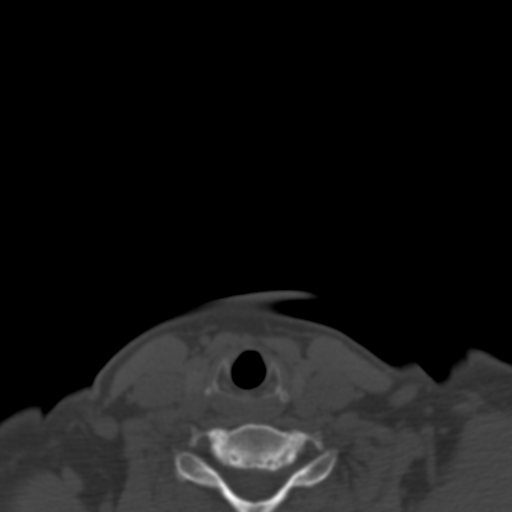
[im 10/94  bone]
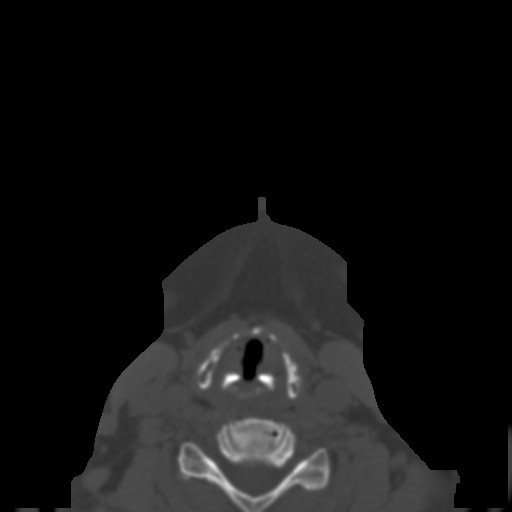
[im 17/94  bone]
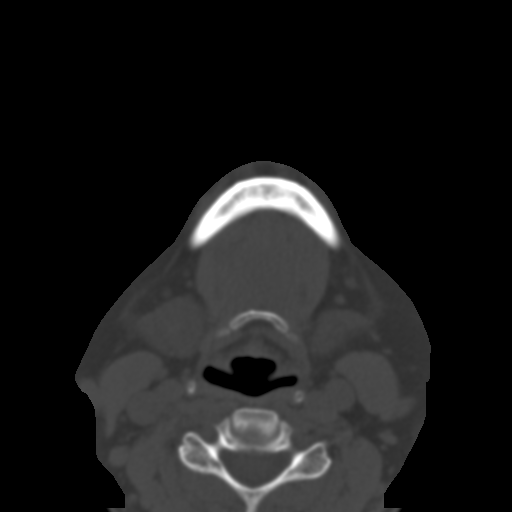
[im 23/94  bone]
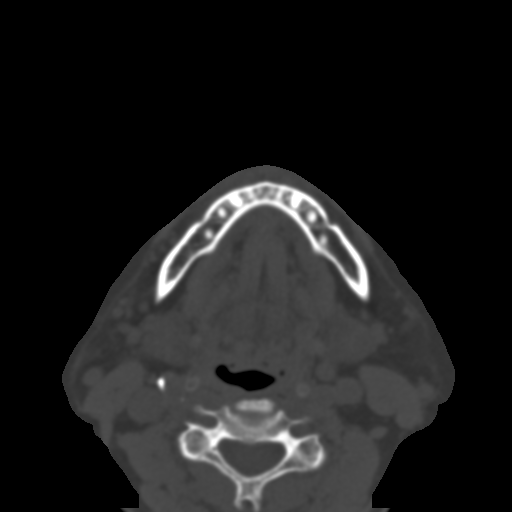
[im 29/94  brain]
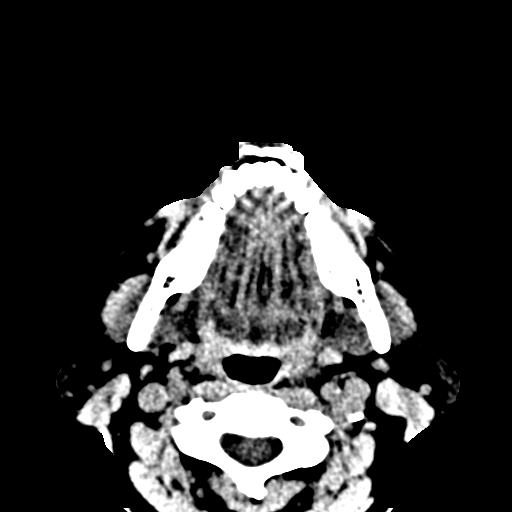
[im 29/94  bone]
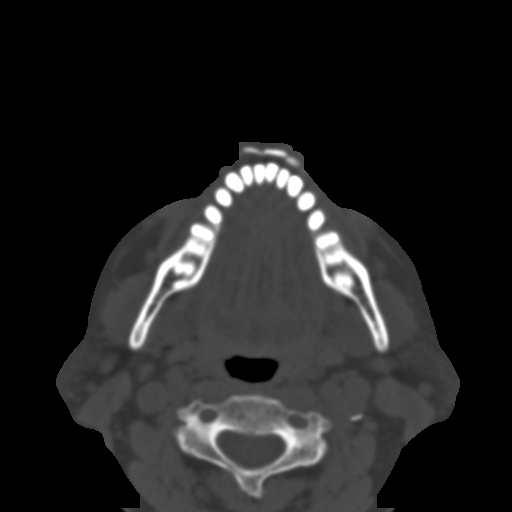
[im 36/94  bone]
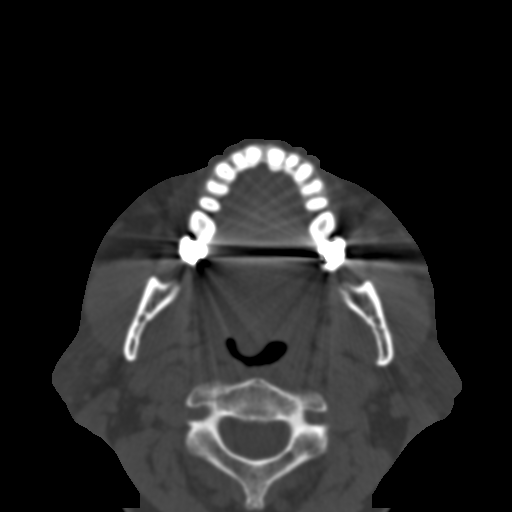
[im 42/94  bone]
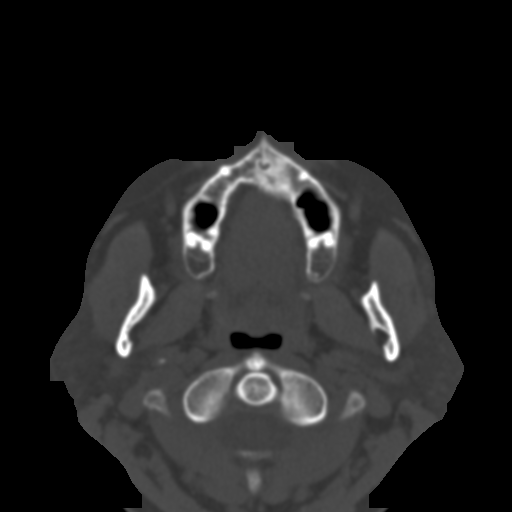
[im 49/94  bone]
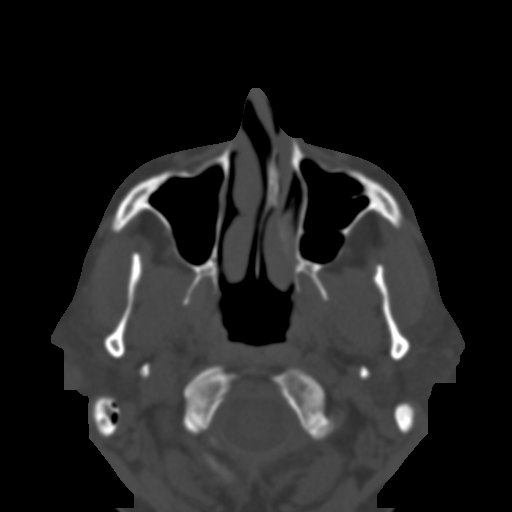
[im 52/94  brain]
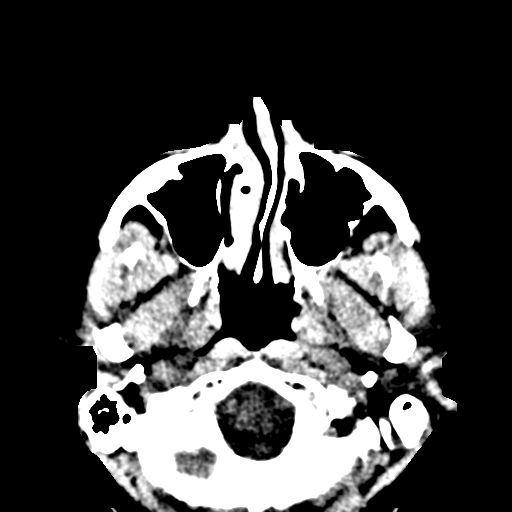
[im 52/94  bone]
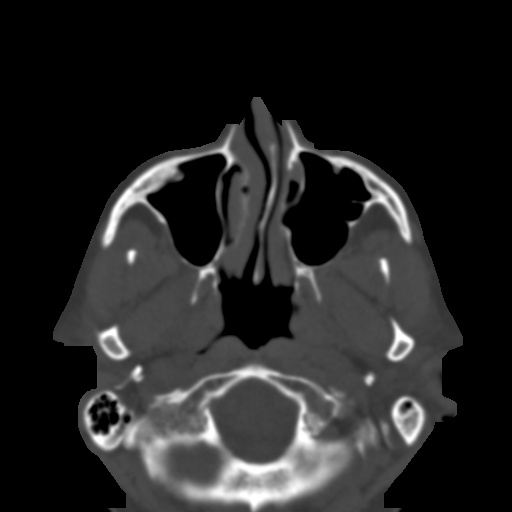
[im 58/94  bone]
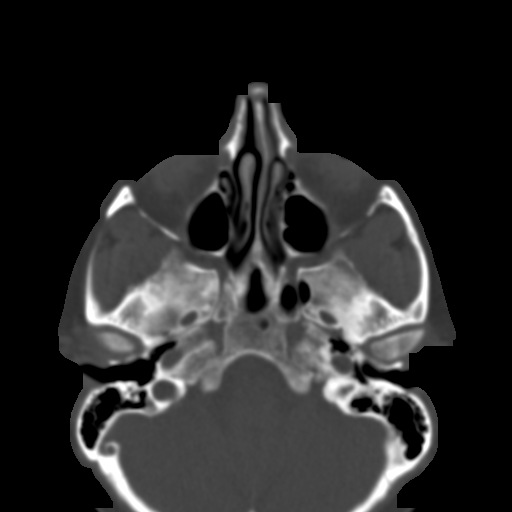
[im 65/94  bone]
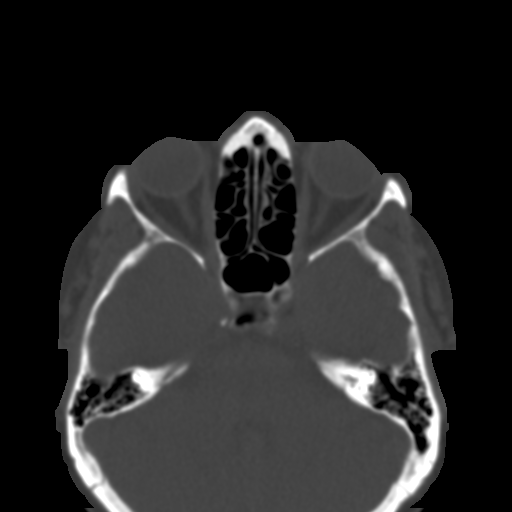
[im 71/94  bone]
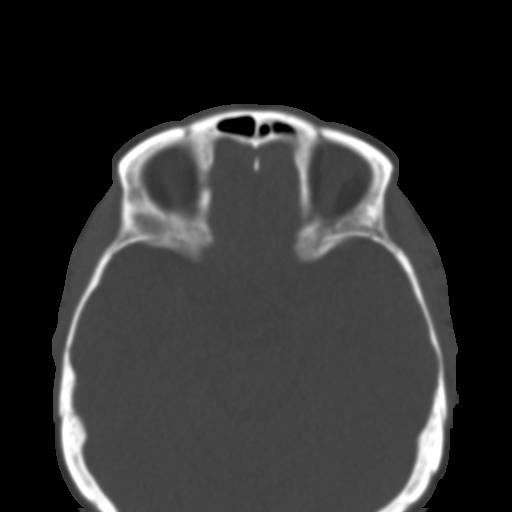
[im 77/94  brain]
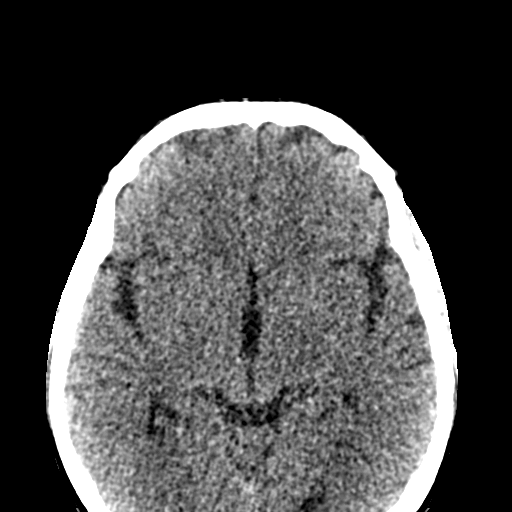
[im 77/94  bone]
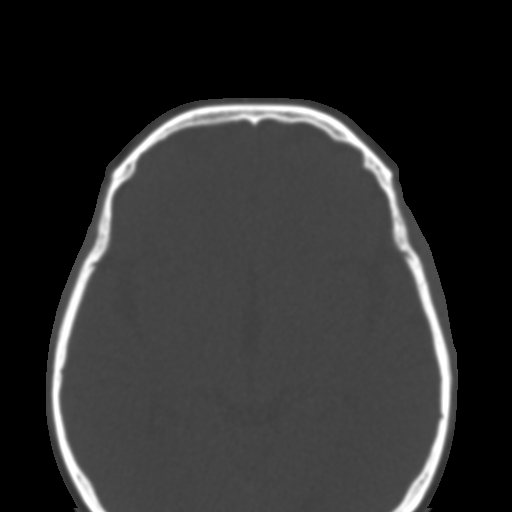
[im 84/94  bone]
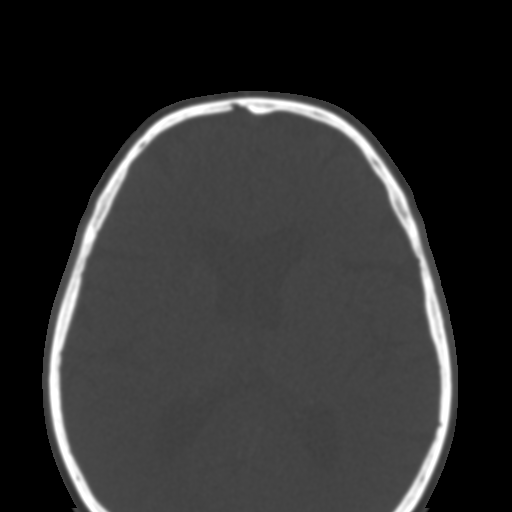
[im 90/94  bone]
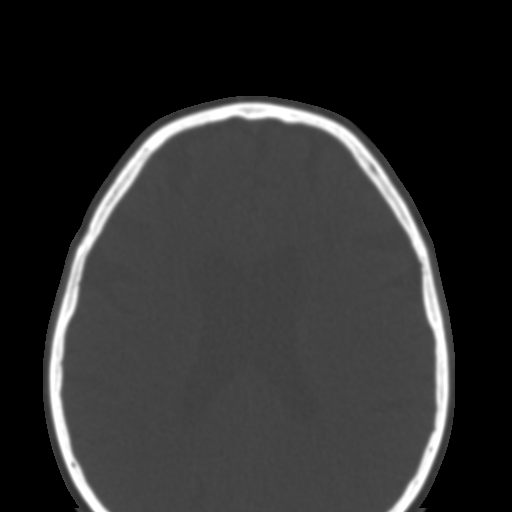

[15 of 30 positions shown; findings below may reference images not displayed]

FINDINGS: Osseous: No acute fracture or destructive osseous process.

Orbits: Unremarkable.

Sinuses: Clear paranasal sinuses and mastoid air cells. Patent sinus
drainage pathways. 5 mm leftward deviation of an intact nasal septum
with a septal spur contacting the left inferior nasal turbinate.
Asymmetric right inferior nasal turbinate hypertrophy.

Soft tissues: No acute inflammatory changes or mass is identified in
the maxillofacial soft tissues on this unenhanced study. Minor
calcific atherosclerosis at the right carotid bifurcation.

Limited intracranial: Unremarkable.
IMPRESSION: 1. Clear paranasal sinuses.
2. Leftward nasal septal deviation.

## 2023-09-18 MED ORDER — REPATHA SURECLICK 140 MG/ML ~~LOC~~ SOAJ: 140.0000 mg | SUBCUTANEOUS | 3 refills | Status: DC

## 2023-09-24 ENCOUNTER — Encounter: Payer: Self-pay | Admitting: Internal Medicine

## 2023-09-26 ENCOUNTER — Other Ambulatory Visit: Payer: Self-pay

## 2023-09-26 MED ORDER — METOPROLOL SUCCINATE ER 25 MG PO TB24: 25.0000 mg | ORAL_TABLET | Freq: Every day | ORAL | 1 refills | Status: DC

## 2023-10-01 ENCOUNTER — Other Ambulatory Visit: Payer: Self-pay | Admitting: Family

## 2023-10-16 ENCOUNTER — Encounter (HOSPITAL_BASED_OUTPATIENT_CLINIC_OR_DEPARTMENT_OTHER): Payer: Self-pay

## 2023-10-16 ENCOUNTER — Other Ambulatory Visit: Payer: Self-pay | Admitting: Internal Medicine

## 2023-10-17 ENCOUNTER — Other Ambulatory Visit (HOSPITAL_COMMUNITY): Payer: Self-pay

## 2023-10-17 ENCOUNTER — Telehealth: Payer: Self-pay | Admitting: Pharmacy Technician

## 2023-10-17 NOTE — Telephone Encounter (Signed)
 Gave more information to the insurance

## 2023-10-17 NOTE — Telephone Encounter (Signed)
 Pharmacy Patient Advocate Encounter   Received notification from Patient Advice Request messages that prior authorization for REPATHA is required/requested.   Insurance verification completed.   The patient is insured through [facility] .   Per test claim: PA required; PA submitted to above mentioned insurance via CoverMyMeds Key/confirmation #/EOC [id number] Status is pending

## 2023-10-19 NOTE — Telephone Encounter (Signed)
 Pharmacy Patient Advocate Encounter  Received notification from [facility] that Prior Authorization for repatha has been APPROVED from 09/18/23 to 10/17/24. Unable to obtain price due to refill too soon rejection, last fill date 10/01/23 next available fill date3/24/25

## 2023-10-31 ENCOUNTER — Ambulatory Visit
Admission: RE | Admit: 2023-10-31 | Discharge: 2023-10-31 | Disposition: A | Discharge status: Home / Self Care | Payer: Medicare Other | Source: Ambulatory Visit | Attending: Family Medicine | Admitting: Family Medicine

## 2023-10-31 DIAGNOSIS — Z780 Asymptomatic menopausal state: Secondary | ICD-10-CM

## 2023-11-04 ENCOUNTER — Encounter: Payer: Self-pay | Admitting: Internal Medicine

## 2023-11-08 ENCOUNTER — Encounter: Payer: Self-pay | Admitting: Internal Medicine

## 2023-11-08 NOTE — Progress Notes (Unsigned)
    Subjective:    Patient ID: Shannon Osborne, female    DOB: 1956-04-17, 68 y.o.   MRN: [id number]      HPI Marvell is here for No chief complaint on file.   Wegovy - cad or ozenpic - cad and dm  A1c 6.1%   08/2023     Medications and allergies reviewed with patient and updated if appropriate.  Current Outpatient Medications on File Prior to Visit  Medication Sig Dispense Refill   aspirin EC 81 MG tablet Take 1 tablet (81 mg total) by mouth daily. Swallow whole. 30 tablet 11   azelastine (ASTELIN) 0.1 % nasal spray Place 1 spray into both nostrils 2 (two) times daily. Use in each nostril as directed     carisoprodol (SOMA) 350 MG tablet Take 1 tablet (350 mg total) by mouth 3 (three) times daily as needed for muscle spasms. 90 tablet 0   cholecalciferol (VITAMIN D3) 25 MCG (1000 UNIT) tablet Take 1,000 Units by mouth 2 (two) times daily.     Evolocumab (REPATHA SURECLICK) 140 MG/ML SOAJ Inject 140 mg into the skin every 14 (fourteen) days. 6 mL 3   ezetimibe (ZETIA) 10 MG tablet TAKE 1 TABLET AT BEDTIME 90 tablet 3   fluticasone (FLONASE SENSIMIST) 27.5 MCG/SPRAY nasal spray Place 2 sprays into the nose daily as needed for rhinitis or allergies.     icosapent Ethyl (VASCEPA) 1 g capsule Take 2 capsules (2 g total) by mouth 2 (two) times daily. 360 capsule 3   losartan (COZAAR) 25 MG tablet TAKE 1 TABLET(25 MG) BY MOUTH DAILY 90 tablet 1   melatonin 3 MG TABS tablet Take 3 mg by mouth at bedtime.     metoprolol succinate (TOPROL-XL) 25 MG 24 hr tablet Take 1 tablet (25 mg total) by mouth daily. 90 tablet 1   Multiple Vitamin (MULTIVITAMIN WITH MINERALS) TABS tablet Take 1 tablet by mouth daily.     Omega-3 Fatty Acids (OMEGA 3 PO) Take 1 capsule by mouth daily.     omeprazole (PRILOSEC) 20 MG capsule Take 1 capsule (20 mg total) by mouth daily.     OVER THE COUNTER MEDICATION Take 1 tablet by mouth 2 (two) times daily. Vivscal [facility]     pregabalin (LYRICA) 25 MG capsule  Take 1 capsule (25 mg total) by mouth 2 (two) times daily as needed (Lumbar radiculopathy).     triamcinolone cream (KENALOG) 0.1 % Apply 1 Application topically 2 (two) times daily as needed. 454 g 0   No current facility-administered medications on file prior to visit.    Review of Systems     Objective:  There were no vitals filed for this visit. BP Readings from Last 3 Encounters:  09/10/23 120/74  06/18/23 120/80  06/11/23 136/79   Wt Readings from Last 3 Encounters:  09/10/23 143 lb (64.9 kg)  06/18/23 135 lb (61.2 kg)  06/11/23 141 lb 1.6 oz (64 kg)   There is no height or weight on file to calculate BMI.    Physical Exam         Assessment & Plan:    See Problem List for Assessment and Plan of chronic medical problems.

## 2023-11-08 NOTE — Patient Instructions (Incomplete)
   Medications changes include :   fluoxetine 20 mg daily,  xanax 0.5 mg twice daily as needed for panic attack.  Buspar 5 mg at night as needed for sleep.

## 2023-11-09 ENCOUNTER — Ambulatory Visit (INDEPENDENT_AMBULATORY_CARE_PROVIDER_SITE_OTHER): Admitting: Internal Medicine

## 2023-11-09 VITALS — BP 126/80 | HR 69 | Temp 98.3°F | Ht 59.0 in | Wt 146.2 lb

## 2023-11-09 DIAGNOSIS — R7303 Prediabetes: Secondary | ICD-10-CM | POA: Diagnosis not present

## 2023-11-09 DIAGNOSIS — Z780 Asymptomatic menopausal state: Secondary | ICD-10-CM

## 2023-11-09 DIAGNOSIS — I10 Essential (primary) hypertension: Secondary | ICD-10-CM

## 2023-11-09 DIAGNOSIS — I2510 Atherosclerotic heart disease of native coronary artery without angina pectoris: Secondary | ICD-10-CM

## 2023-11-09 DIAGNOSIS — M545 Low back pain, unspecified: Secondary | ICD-10-CM

## 2023-11-09 DIAGNOSIS — M8580 Other specified disorders of bone density and structure, unspecified site: Secondary | ICD-10-CM

## 2023-11-09 DIAGNOSIS — G8929 Other chronic pain: Secondary | ICD-10-CM | POA: Insufficient documentation

## 2023-11-09 NOTE — Assessment & Plan Note (Signed)
 Chronic Reviewed most recent bone density scan Advised 1200 mg of calcium a day-combination of supplements and food Continue vitamin D daily Stressed regular exercise-weightbearing, resistance No need for medication at this time-FRAX is low Repeat DEXA in 2 years

## 2023-11-09 NOTE — Assessment & Plan Note (Signed)
 Chronic back issues-history of surgery Has some nerve pain in both feet with certain movements of the back Now having some hip pain and right ischial pain ?  Coming from her back versus separate issue Will go and see orthopedic for further evaluation

## 2023-11-09 NOTE — Assessment & Plan Note (Signed)
Chronic Blood pressure well controlled Continue metoprolol XL 25 mg daily, losartan 25 mg daily

## 2023-11-12 ENCOUNTER — Ambulatory Visit (INDEPENDENT_AMBULATORY_CARE_PROVIDER_SITE_OTHER): Payer: Medicare Other | Admitting: Family

## 2023-11-12 ENCOUNTER — Encounter (HOSPITAL_BASED_OUTPATIENT_CLINIC_OR_DEPARTMENT_OTHER): Payer: Self-pay | Admitting: Family

## 2023-11-12 VITALS — BP 138/70 | HR 88 | Ht 59.0 in | Wt 146.8 lb

## 2023-11-12 DIAGNOSIS — I2510 Atherosclerotic heart disease of native coronary artery without angina pectoris: Secondary | ICD-10-CM

## 2023-11-12 DIAGNOSIS — I10 Essential (primary) hypertension: Secondary | ICD-10-CM

## 2023-11-12 DIAGNOSIS — I25118 Atherosclerotic heart disease of native coronary artery with other forms of angina pectoris: Secondary | ICD-10-CM

## 2023-11-12 DIAGNOSIS — Z79899 Other long term (current) drug therapy: Secondary | ICD-10-CM

## 2023-11-12 DIAGNOSIS — E785 Hyperlipidemia, unspecified: Secondary | ICD-10-CM

## 2023-11-12 DIAGNOSIS — E782 Mixed hyperlipidemia: Secondary | ICD-10-CM | POA: Diagnosis not present

## 2023-11-12 DIAGNOSIS — Z789 Other specified health status: Secondary | ICD-10-CM

## 2023-11-12 MED ORDER — ICOSAPENT ETHYL 1 G PO CAPS: 2.0000 g | ORAL_CAPSULE | Freq: Two times a day (BID) | ORAL | 3 refills | Status: AC

## 2023-11-12 NOTE — Progress Notes (Signed)
 Cardiology Office Note:  .   Date:  11/12/2023  ID:  Shannon Osborne, DOB 1956/01/01, MRN [id number] PCP: Pincus Sanes, MD  [facility] Providers Cardiologist:  None    History of Present Illness: .   Shannon Osborne is a 68 y.o. female with history of systolic arrest with anesthesia, breast cancer (BRCA1 positive) s/p chemo, hyperlipidemia, GERD, aortic atherosclerosis, hypertension, coronary artery disease.  Calcium score 09/2014 calcium score 353 placing her in the 90th percentile.  Did not tolerate statins including simvastatin, Crestor, Lipitor, Livalo.  Shoulder CT scan 2023 inadvertently revealed atherosclerosis of aorta, left subclavian artery, coronary arteries.  TTE 10/27/2021 LVEF 60 to 65%, RVSP 24.7 mmHg, mild MR, normal RV, grade 1 diastolic dysfunction.  Admitted 08/2022 after asystolic arrest after anesthesia for back surgery after receiving propofol.  Coronary CTA mild nonobstructive disease with calcium score 520.  TTE at that time LVEF 45 to 50%, grade 1 diastolic dysfunction moderate pulmonary hypertension.  At follow-up 09/2022 she was doing well with repeat TTE LVEF 60 to 65%, grade 1 diastolic dysfunction, mild MR.  Presents today for follow-up independently.  Doing overall well since last seen.  Motivated to maintain a healthy lifestyle though left foot injury is presently limiting her walking.  She recently bought an e-bike for exercise and yesterday was able to achieve 40 minutes.Reports no shortness of breath nor dyspnea on exertion. Reports no chest pain, pressure, or tightness. No edema, orthopnea, PND. Reports no palpitations.    ROS: Please see the history of present illness.    All other systems reviewed and are negative.   Studies Reviewed: .        Cardiac Studies & Procedures   ______________________________________________________________________________________________     ECHOCARDIOGRAM  ECHOCARDIOGRAM LIMITED 10/11/2022  Narrative ECHOCARDIOGRAM  LIMITED REPORT    Patient Name:   Shannon Osborne   Date of Exam: 10/11/2022 Medical Rec #:  [id number]     Height:       59.0 in Accession #:    [id number]    Weight:       142.0 lb Date of Birth:  Oct 12, 1955      BSA:          1.595 m Patient Age:    66 years      BP:           134/82 mmHg Patient Gender: F             HR:           72 bpm. Exam Location:  [facility]  Procedure: Cardiac Doppler, Limited Color Doppler and Limited Echo  Indications:    I42.9 Cardiomyopathy/ HFrEF  History:        Patient has prior history of Echocardiogram examinations. Cardiomyopathy, CAD, Mitral Valve Prolapse, Arrythmias:Asystole- pre operative; Risk Factors:Hypertension and Dyslipidemia. Breast Cancer (2009- right, status post Mastectomy with Chemotherapy) BRCA 1 positive (2010- Left, status post Mastectomy).  Sonographer:    Farrel Conners RDCS Referring Phys: Darden Dates WOODY  IMPRESSIONS   1. Limited study for LV function 2. Left ventricular ejection fraction, by estimation, is 60 to 65%. The left ventricle has normal function. The left ventricle has no regional wall motion abnormalities. Left ventricular diastolic parameters are consistent with Grade I diastolic dysfunction (impaired relaxation). 3. Mild mitral valve regurgitation. There is mild late systolic prolapse of the middle scallop of the posterior leaflet of the mitral valve. 4. The aortic valve is tricuspid. Aortic valve regurgitation is not visualized.  Comparison(s): Changes from prior study are noted. 08/29/2022: LVEF 45-50%, global hypokinesis. Compared with this prior study, the LVEF has normalized.  FINDINGS Left Ventricle: Left ventricular ejection fraction, by estimation, is 60 to 65%. The left ventricle has normal function. The left ventricle has no regional wall motion abnormalities. The left ventricular internal cavity size was normal in size. There is no left ventricular hypertrophy. Left ventricular diastolic  parameters are consistent with Grade I diastolic dysfunction (impaired relaxation). Indeterminate filling pressures.  Left Atrium: Left atrial size was normal in size.  Right Atrium: Right atrial size was normal in size.  Mitral Valve: There is mild late systolic prolapse of the middle scallop of the posterior leaflet of the mitral valve. There is mild thickening of the anterior and posterior mitral valve leaflet(s). There is mild calcification of the mitral valve leaflet(s). Mild mitral valve regurgitation.  Aortic Valve: The aortic valve is tricuspid. Aortic valve regurgitation is not visualized.  Pulmonic Valve: The pulmonic valve was grossly normal. Pulmonic valve regurgitation is trivial.  Aorta: The aortic root and ascending aorta are structurally normal, with no evidence of dilitation.  IAS/Shunts: No atrial level shunt detected by color flow Doppler.  LEFT VENTRICLE PLAX 2D LVIDd:         3.70 cm   Diastology LVIDs:         2.20 cm   LV e' medial:    6.36 cm/s LV PW:         1.00 cm   LV E/e' medial:  12.7 LV IVS:        1.10 cm   LV e' lateral:   6.85 cm/s LVOT diam:     2.00 cm   LV E/e' lateral: 11.8 LV SV:         62 LV SV Index:   39 LVOT Area:     3.14 cm   RIGHT VENTRICLE RV S prime:     12.80 cm/s TAPSE (M-mode): 2.6 cm  LEFT ATRIUM           Index LA diam:      3.10 cm 1.94 cm/m LA Vol (A2C): 45.9 ml 28.79 ml/m AORTIC VALVE LVOT Vmax:   88.85 cm/s LVOT Vmean:  61.550 cm/s LVOT VTI:    0.196 m  AORTA Ao Root diam: 3.10 cm Ao Asc diam:  3.20 cm  MITRAL VALVE MV Area (PHT)  cm         SHUNTS MV Decel Time: 230 msec    Systemic VTI:  0.20 m MV E velocity: 80.95 cm/s  Systemic Diam: 2.00 cm MV A velocity: 96.20 cm/s MV E/A ratio:  0.84  Zoila Shutter MD Electronically signed by Zoila Shutter MD Signature Date/Time: 10/11/2022/1:40:30 PM    Final    MONITORS  LONG TERM MONITOR (3-14 DAYS) 07/07/2016   CT SCANS  CT CORONARY MORPH W/CTA  COR W/SCORE 08/30/2022  Addendum 08/30/2022  5:14 PM ADDENDUM REPORT: 08/30/2022 17:12  EXAM: OVER-READ INTERPRETATION  CT CHEST  The following report is an over-read performed by radiologist Dr. Neita Garnet of [facility] on 08/30/2022. This over-read does not include interpretation of cardiac or coronary anatomy or pathology. The coronary calcium score/coronary CTA interpretation by the cardiologist is attached.  COMPARISON:  Chest two views 06/26/2021  FINDINGS: Cardiovascular: There are no significant extracardiac vascular findings.  Mediastinum/Nodes: A surgical clip is seen within the lateral left chest wall (axial series 10, image 39, coronal series 602 image 35). No lymphadenopathy is identified.  Lungs/Pleura: There is no pleural effusion. Mild curvilinear subsegmental atelectasis versus scarring within the anterior left lower lobe.  Upper abdomen: No significant findings in the visualized upper abdomen.  Musculoskeletal/Chest wall: Mild dextrocurvature of the mid to lower thoracic spine. Moderate to severe multilevel disc space narrowing and mild anterior endplate osteophytes.  IMPRESSION: No significant extracardiac findings within the visualized chest.   Electronically Signed By: Neita Garnet M.D. On: 08/30/2022 17:12  Narrative CLINICAL DATA:  Chest pain  EXAM: Cardiac/Coronary CTA  TECHNIQUE: A non-contrast, gated CT scan was obtained with axial slices of 3 mm through the heart for calcium scoring. Calcium scoring was performed using the Agatston method. A 120 kV prospective, gated, contrast cardiac scan was obtained. Gantry rotation speed was 250 msecs and collimation was 0.6 mm. Two sublingual nitroglycerin tablets (0.8 mg) were given. The 3D data set was reconstructed in 5% intervals of the 35-75% of the R-R cycle. Diastolic phases were analyzed on a dedicated workstation using MPR, MIP, and VRT modes. The patient received 95 cc  of contrast.  FINDINGS: Image quality: Excellent.  Noise artifact is: Limited.  Coronary Arteries:  Normal coronary origin.  Left dominance.  Left main: The left main is a large caliber vessel with a normal take off from the left coronary cusp that bifurcates to form a left anterior descending artery and a left circumflex artery. There is no plaque or stenosis.  Left anterior descending artery: The proximal and mid LAD segments contains mild calcified plaque (25-49%). The distal LAD is patent. The LAD gives off 2 patent diagonal branches.  Left circumflex artery: The LCX is non-dominant. There is minimal calcified plaque (<25%) in the proximal and mid segments. The distal LCX is patent. The LCX gives off 3 patent obtuse marginal branches. The LCX terminates as a patent PDA.  Right coronary artery: The RCA is non-dominant with normal take off from the right coronary cusp. There is no evidence of plaque or stenosis.  Right Atrium: Right atrial size is within normal limits.  Right Ventricle: The right ventricular cavity is within normal limits.  Left Atrium: Left atrial size is normal in size with no left atrial appendage filling defect. Small PFO.  Left Ventricle: The ventricular cavity size is within normal limits. Small vasal inferoseptal diverticulum.  Pulmonary arteries: Normal in size without proximal filling defect.  Pulmonary veins: Normal pulmonary venous drainage.  Pericardium: Normal thickness without significant effusion or calcium present.  Cardiac valves: The aortic valve is trileaflet without significant calcification. The mitral valve is normal without significant calcification.  Aorta: Normal caliber without significant disease.  Extra-cardiac findings: See attached radiology report for non-cardiac structures.  IMPRESSION: 1. Coronary calcium score of 520. This was 94th percentile for age-, sex, and race-matched controls.  2. Normal coronary  origin with left dominance.  3. Mild calcified plaque (25-49%) in the LAD.  4. Minimal calcified plaque (<25%) in the LCX.  5. Small PFO.  RECOMMENDATIONS: 1. Mild non-obstructive CAD (25-49%). Consider non-atherosclerotic causes of chest pain. Consider preventive therapy and risk factor modification.  Lennie Odor, MD  Electronically Signed: By: Lennie Odor M.D. On: 08/30/2022 16:45     ______________________________________________________________________________________________      Risk Assessment/Calculations:             Physical Exam:   VS:  BP 138/70   Pulse 88   Ht 4\' 11"  (1.499 m)   Wt 146 lb 12.8 oz (66.6 kg)   SpO2 99%   BMI 29.65  kg/m    Wt Readings from Last 3 Encounters:  11/12/23 146 lb 12.8 oz (66.6 kg)  11/09/23 146 lb 3.2 oz (66.3 kg)  09/10/23 143 lb (64.9 kg)    GEN: Well nourished, well developed in no acute distress NECK: No JVD; No carotid bruits CARDIAC: RRR, no murmurs, rubs, gallops RESPIRATORY:  Clear to auscultation without rales, wheezing or rhonchi  ABDOMEN: Soft, non-tender, non-distended EXTREMITIES:  No edema; No deformity   ASSESSMENT AND PLAN: .    Asystolic arrest-in the setting of anesthesia (propofol) prior to back surgery.  Cardiac CTA at that time, as above, with no obstructive disease.  TTE at that time LVEF 45 to 50% with improvement to 60 to 65%.  Management of CAD, as below.  CAD/aortic atherosclerosis/HLD, LDL goal is less than 70/statin intolerance- Stable with no anginal symptoms. No indication for ischemic evaluation.  GDMT aspirin 81 mg daily, Repatha 140 mg every 2 weeks, Vascepa 2 g twice daily, Toprol 25 mg daily.  As BP well controlled, reasonable to stop [place].  Recommend aiming for 150 minutes of moderate intensity activity per week and following a heart healthy diet.  Refer to PREP exercise program.  HTN-BP mildly elevated today but reports well-controlled at home and recent clinic visits.  Continue  present antihypertensive regimen Toprol 25 mg daily, losartan 25 mg daily. Discussed to monitor BP at home at least 2 hours after medications and sitting for 5-10 minutes.  Encouraged to contact our office if BP routinely greater than 130/80.       Dispo: Follow-up in 1 year  Signed, Alver Sorrow, NP

## 2023-11-12 NOTE — Patient Instructions (Addendum)
 Medication Instructions:   STOP Ezetimibe (Zetia) *If you need a refill on your cardiac medications before your next appointment, please call your pharmacy*  Testing/Procedures: Your EKG looked great!  Follow-Up: At [facility], you and your health needs are our priority.  As part of our continuing mission to provide you with exceptional heart care, we have created designated Provider Care Teams.  These Care Teams include your primary Cardiologist (physician) and Advanced Practice Providers (APPs -  Physician Assistants and Nurse Practitioners) who all work together to provide you with the care you need, when you need it.  We recommend signing up for the patient portal called "MyChart".  Sign up information is provided on this After Visit Summary.  MyChart is used to connect with patients for Virtual Visits (Telemedicine).  Patients are able to view lab/test results, encounter notes, upcoming appointments, etc.  Non-urgent messages can be sent to your provider as well.   To learn more about what you can do with MyChart, go to [email or web address].    Your next appointment:   1 year(s)  Provider:   Jodelle Red, MD, Eligha Bridegroom, NP, or Gillian Shields, NP    Other Instructions  Keep up the great work with your dietary changes!  Heart Healthy Diet Recommendations: A low-salt diet is recommended. Meats should be grilled, baked, or boiled. Avoid fried foods. Focus on lean protein sources like fish or chicken with vegetables and fruits. The American Heart Association is a Chief Technology Officer!  American Heart Association Diet and Lifeystyle Recommendations

## 2023-11-15 ENCOUNTER — Telehealth: Payer: Self-pay | Admitting: *Deleted

## 2023-11-15 NOTE — Telephone Encounter (Signed)
 Contacted regarding PREP Class referral. She is interested in participating but has vacations scheduled for May and June this summer. She would like to be contacted back with July/August class availability. She would prefer a class at the [facility] if that becomes an option in the future.

## 2023-11-26 NOTE — Therapy (Unsigned)
 OUTPATIENT PHYSICAL THERAPY FEMALE PELVIC EVALUATION   Patient Name: Shannon Osborne MRN: [id number] DOB:[birth year], 68 y.o., female Today's Date: 11/27/2023  END OF SESSION:  PT End of Session - 11/27/23 1545     Visit Number 1    Date for PT Re-Evaluation 02/19/24    Authorization Type Medicare- federal    PT Start Time 1445    PT Stop Time 1540    PT Time Calculation (min) 55 min    Activity Tolerance Patient tolerated treatment well    Behavior During Therapy [facility] for tasks assessed/performed             Past Medical History:  Diagnosis Date   Allergy In my teens   Anemia    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   Breast cancer (HCC) 2009   BRACA 1 Pos   Cataract 2000   Chronic midline low back pain without sciatica 02/18/2020   Coronary artery disease    mild   COVID-19 10/20/2022   Family history of adverse reaction to anesthesia    mother is slow to wake up   Fibromyalgia    GERD (gastroesophageal reflux disease) 02/18/2020   Hyperlipemia    Hypertension    Mitral valve prolapse    never has caused any problems per patient 12/04/22   Multiple drug allergies 02/18/2020   Myalgia due to statin 02/18/2020   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   MRI 05/2018   Osteopenia after menopause 02/11/2021   DEXA 01/2021 lowest T = -1.7 femur; recheck 2-3 years   Pneumonia    x 1 - years ago   PONV (postoperative nausea and vomiting)    after most recent colonoscopy (2020)   Past Surgical History:  Procedure Laterality Date   ABDOMINAL HYSTERECTOMY  2002   BREAST SURGERY     right 09/10/2007, left 06/16/2009   colonosocpy     several   FOOT BONE EXCISION Right 2021   at [facility]  - fusion heel bone   JOINT REPLACEMENT     Left shoulder   SHOULDER ARTHROTOMY Left 2013   and 06/2015   SPINE SURGERY  12/11/2022   TONSILLECTOMY  2012   TRANSFORAMINAL LUMBAR INTERBODY FUSION (TLIF) WITH PEDICLE SCREW FIXATION 1 LEVEL N/A 12/11/2022   Procedure: Lumbar five-Sacral  one Laminectomy, Transforaminal Lumbar Interbody Fusion, posterolateral instrumented fusion;  Surgeon: Jadene Pierini, MD;  Location: [facility] OR;  Service: Neurosurgery;  Laterality: N/A;   UPPER GI ENDOSCOPY     Patient Active Problem List   Diagnosis Date Noted   Chronic back pain 11/09/2023   Statin-induced myositis 09/10/2023   Prediabetes 09/10/2023   Muscle cramping 09/10/2023   Peripheral vascular disease (HCC) 09/10/2023   S/P lumbar fusion L5-S1 09/10/2023   Lumbar radiculopathy 09/10/2023   Stress incontinence in female 09/10/2023   Allergies 03/26/2023   Atopic dermatitis 03/26/2023   Personal history of cardiovascular disorder 03/26/2023   Tinnitus of both ears 03/19/2023   Other adverse food reactions, not elsewhere classified, subsequent encounter 03/12/2023   Local reaction to hymenoptera sting 03/12/2023   Other allergic rhinitis 03/12/2023   Asystole (HCC) 08/30/2022   Nonobstructive atherosclerosis of coronary artery 08/30/2022   Spondylolisthesis of lumbar region 08/29/2022   History of asbestos exposure 03/02/2022   Mild mitral regurgitation 03/02/2022   Atherosclerosis of aorta (HCC) 03/02/2022   Agatston coronary artery calcium score of 520   (08/2022) 03/02/2022   Facial twitching 10/07/2021   Imbalance 10/07/2021   Nasal congestion 10/07/2021  Nasal septal deviation 10/07/2021   Nasal turbinate hypertrophy 10/07/2021   Essential hypertension 07/07/2021   Osteopenia after menopause 02/11/2021   Long-term current use of proton pump inhibitor therapy 11/29/2020   Nontraumatic complete tear of left rotator cuff 08/05/2020   Postherpetic neuralgia 04/28/2020   History of breast cancer 02/18/2020   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   GERD (gastroesophageal reflux disease) 02/18/2020   Mixed hyperlipidemia 02/18/2020   Colon polyp 02/18/2020   Multiple drug reactions/side effects 02/18/2020   Status post  bilateral mastectomy 02/18/2020   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   Post-traumatic arthritis of right foot 01/16/2020   Status post shoulder replacement, 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    PCP: Pincus Sanes, MD Ref Provider (PCP  REFERRING PROVIDER: Pincus Sanes, MD  REFERRING DIAG: N39.3 (ICD-10-CM) - Stress incontinence in female  THERAPY DIAG:  Other abnormalities of gait and mobility  Pain in right hip  Other lack of coordination  Muscle weakness (generalized)  Rationale for Evaluation and Treatment: Rehabilitation  ONSET DATE: 2010  SUBJECTIVE:                                                                                                                                                                                           SUBJECTIVE STATEMENT: Pt reports that she has problems with incontinence when she coughs and sneezes and stuff.  Her friend Thurston Hole came here.  Has always done kegels, kept everything at bay,  Has allergies Has had left shoulder replacement, right foot fusion-  Has some right hip pain Hx of breast cancer 2010- chemo affected her bones and ligaments Still has some nerve pain in her back since surgery Active, went right to her doctor Hysterectomy 2004, did not tolerate estrogen patches Knots in breast turned into tumors    Fluid intake: 12 glasses of water/ day  PAIN:  Are you having pain? Yes right hip and burning in her feet NPRS scale: 8/10 Pain location:  hip and feet  Pain type: burning Pain description: intermittent   Aggravating factors: bending, twisting- feet Relieving factors: not bending, twisting, shot  PRECAUTIONS: None  RED FLAGS: None   WEIGHT BEARING RESTRICTIONS: No  FALLS:  Has patient fallen in last 6 months? No  OCCUPATION: retired  ACTIVITY LEVEL : high-  PLOF: Independent  PATIENT GOALS: not to leak with sneezing and coughing  PERTINENT HISTORY:  ABDOMINAL HYSTERECTOMY  2002    BREAST SURGERY   right  09/10/2007, left 06/16/2009   colonosocpy   several   FOOT BONE EXCISION Right 2021 at [facility]  - fusion heel  bone   JOINT REPLACEMENT   Left shoulder   SHOULDER ARTHROTOMY Left 2013 and 06/2015   SPINE SURGERY  12/11/2022    TONSILLECTOMY  2012    TRANSFORAMINAL LUMBAR INTERBODY FUSION (TLIF) WITH PEDICLE SCREW FIXATION 1 LEVEL         Sexual abuse: No  BOWEL MOVEMENT: no issues, used to be contipated Pain with bowel movement: No Type of bowel movement:Type (Bristol Stool Scale) 3-4 Fully empty rectum: Yes:   Leakage: No Pads: No Fiber supplement/laxative Yes   URINATION: Pain with urination: No Fully empty bladder: Yes:    presses to get the bladder fully empty Stream: Strong Urgency: No Frequency: no Leakage: Coughing, Sneezing, and Bending forward- one time Pads: No changes underwear  INTERCOURSE: not active   PREGNANCY:no  PROLAPSE: None   OBJECTIVE:  Note: Objective measures were completed at Evaluation unless otherwise noted.     COGNITION: Overall cognitive status: Within functional limits for tasks assessed     SENSATION: Light touch: Deficits    LUMBAR SPECIAL TESTS:  Stork standing: Negative    GAIT: Assistive device utilized: None Comments: antalgic, stiffness present  POSTURE: rounded shoulders and decreased lumbar lordosis   LUMBARAROM/PROM: very limited throughout   LOWER EXTREMITY ROM: some stiffness throughout, ankles and feet limited mobility more on R  LOWER EXTREMITY MMT:4-/5 bilateral hips PALPATION:   General: low abdominal tone, doming present, non tender large lumbar scar, scoliosis present  Pelvic Alignment: seems even  Abdominal: tenderness to touch throughout                External Perineal Exam: to be assessed                             Internal Pelvic Floor: to be assessed  Patient confirms identification and approves PT to assess internal pelvic floor and treatment Yes  PELVIC MMT:   MMT eval  Vaginal    Internal Anal Sphincter   External Anal Sphincter   Puborectalis   Diastasis Recti   (Blank rows = not tested)        TONE: To be assessed  PROLAPSE: To be assessed  TODAY'S TREATMENT:                                                                                                                              DATE: 11/27/23   EVAL see below Neuro reed- ball press with transverse abdominis breath                      Cough with PF contraction                        PATIENT EDUCATION/ there acts:  Education details: relevant anatomy, pressure management, HEP Person educated: Patient Education method: Explanation, Demonstration, Tactile cues, Verbal cues, and Handouts Education comprehension: verbalized understanding and  needs further education  HOME EXERCISE PROGRAM: Access Code: [email or web address] URL: [email or web address] Date: 11/27/2023 Prepared by: Darrell Jewel Jerrie Gullo  Exercises - Seated Abdominal Press into [employer]  - 1 x daily - 7 x weekly - 3 sets - 10 reps - Seated Cough with Pelvic Floor Contraction and Hand to Mouth  - 1 x daily - 7 x weekly - 3 sets - 10 reps - Quadruped Exhale with Pelvic Floor Contraction  - 1 x daily - 7 x weekly - 3 sets - 10 reps - Rowing with Pelvic Floor Contraction  - 1 x daily - 7 x weekly - 3 sets - 10 reps - Shoulder extension with resistance - Neutral  - 1 x daily - 7 x weekly - 3 sets - 10 reps  ASSESSMENT:  CLINICAL IMPRESSION: Patient is a 68 y.o. F who was seen today for physical therapy evaluation and treatment for SUI. She presents with abdominal weakness, breath holding strategies, bilateral hip weakness and decreased coordination with pelvic floor. She will benefit from physical therapy to reduce leaking with coughing and sneezing  OBJECTIVE IMPAIRMENTS: Abnormal gait, decreased coordination, decreased endurance, decreased knowledge of condition, difficulty walking, decreased strength, impaired flexibility, impaired  sensation, and pain.   ACTIVITY LIMITATIONS: bending, continence, and toileting  PARTICIPATION LIMITATIONS: community activity  PERSONAL FACTORS: Time since onset of injury/illness/exacerbation are also affecting patient's functional outcome.   REHAB POTENTIAL: Good  CLINICAL DECISION MAKING: Evolving/moderate complexity  EVALUATION COMPLEXITY: Moderate   GOALS: Goals reviewed with patient? Yes  SHORT TERM GOALS: Target date: 12/25/2023    Pt will be independent with HEP.   Baseline: Goal status: INITIAL  2.   Pt will demonstrate appropriate lateral rib cage excursion with inhale to ensure better abdominal pressure management and pelvic floor/abdominal muscle relaxation.   Baseline:  Goal status: INITIAL  3.  Pt will dem at least 3/5 pelvic floor MMT Baseline:  Goal status: INITIAL   LONG TERM GOALS: Target date: 02/19/2024    Pt will be independent with advanced HEP.   Baseline:  Goal status: INITIAL  2.  Pt will stay dry all day with coughing and sneezing Baseline:  Goal status: INITIAL  3.  Pt will dem at least 4+/5 MMT bilateral hips and at least 4/5 MMT pelvic floor Baseline:  Goal status: INITIAL  PLAN:  PT FREQUENCY: 1-2x/week  PT DURATION: 12 weeks  PLANNED INTERVENTIONS: 97110-Therapeutic exercises, 97530- Therapeutic activity, 97112- Neuromuscular re-education, 97535- Self Care, [id number]- Manual therapy, Taping, Dry Needling, Joint mobilization, Joint manipulation, Spinal manipulation, Spinal mobilization, Scar mobilization, Cryotherapy, Moist heat, and Biofeedback  PLAN FOR NEXT SESSION: internal and exercises   Dannisha Eckmann, PT 11/27/2023, 4:07 PM

## 2023-11-27 ENCOUNTER — Other Ambulatory Visit: Payer: Self-pay

## 2023-11-27 ENCOUNTER — Encounter: Payer: Self-pay | Admitting: Physical Therapy

## 2023-11-27 ENCOUNTER — Ambulatory Visit: Payer: Medicare Other | Attending: Internal Medicine | Admitting: Physical Therapy

## 2023-11-27 DIAGNOSIS — M6281 Muscle weakness (generalized): Secondary | ICD-10-CM | POA: Diagnosis present

## 2023-11-27 DIAGNOSIS — R278 Other lack of coordination: Secondary | ICD-10-CM | POA: Diagnosis present

## 2023-11-27 DIAGNOSIS — N393 Stress incontinence (female) (male): Secondary | ICD-10-CM | POA: Insufficient documentation

## 2023-11-27 DIAGNOSIS — M25551 Pain in right hip: Secondary | ICD-10-CM | POA: Diagnosis present

## 2023-11-27 DIAGNOSIS — R2689 Other abnormalities of gait and mobility: Secondary | ICD-10-CM | POA: Diagnosis present

## 2023-11-27 NOTE — Addendum Note (Signed)
 Addended byBeckie Salts on: 11/27/2023 04:08 PM   Modules accepted: Orders

## 2023-11-28 ENCOUNTER — Ambulatory Visit (INDEPENDENT_AMBULATORY_CARE_PROVIDER_SITE_OTHER)

## 2023-11-28 VITALS — Ht 59.5 in | Wt 142.6 lb

## 2023-11-28 DIAGNOSIS — Z0000 Encounter for general adult medical examination without abnormal findings: Secondary | ICD-10-CM

## 2023-11-28 NOTE — Progress Notes (Signed)
 Subjective:   Shannon Osborne is a 68 y.o. who presents for a Medicare Wellness preventive visit.  Visit Complete: Virtual I connected with  [name] on 11/28/23 by a audio enabled telemedicine application and verified that I am speaking with the correct person using two identifiers.  Patient Location: Home  Provider Location: Office/Clinic  I discussed the limitations of evaluation and management by telemedicine. The patient expressed understanding and agreed to proceed.  Vital Signs: Because this visit was a virtual/telehealth visit, some criteria may be missing or patient reported. Any vitals not documented were not able to be obtained and vitals that have been documented are patient reported.  VideoDeclined- This patient declined Librarian, academic. Therefore the visit was completed with audio only.  Persons Participating in Visit: Patient.  AWV Questionnaire: No: Patient Medicare AWV questionnaire was not completed prior to this visit.  Cardiac Risk Factors include: advanced age (>51men, >32 women);hypertension;dyslipidemia     Objective:    Today's Vitals   11/28/23 1038  Weight: 142 lb 9.6 oz (64.7 kg)  Height: 4' 11.5" (1.511 m)   Body mass index is 28.32 kg/m.     11/28/2023   10:36 AM 11/27/2023    3:43 PM 12/04/2022    8:24 AM 10/24/2022   10:17 AM 10/10/2022    9:11 AM 08/22/2022    1:30 PM 07/24/2022   12:05 PM  Advanced Directives  Does Patient Have a Medical Advance Directive? Yes No Yes  Yes Yes Yes  Type of Estate agent of [place];Living will  Healthcare Power of [employer] of [place];Living will Healthcare Power of [place];Living will Healthcare Power of [employer] of [place];Living will  Does patient want to make changes to medical advance directive?   Yes (MAU/Ambulatory/Procedural Areas - Information given)   No - Patient declined No - Patient declined  Copy of Healthcare  Power of Attorney in Chart? No - copy requested  No - copy requested  No - copy requested No - copy requested   Would patient like information on creating a medical advance directive? No - Patient declined No - Patient declined         Current Medications (verified) Outpatient Encounter Medications as of 11/28/2023  Medication Sig   aspirin EC 81 MG tablet Take 1 tablet (81 mg total) by mouth daily. Swallow whole.   azelastine (ASTELIN) 0.1 % nasal spray Place 1 spray into both nostrils 2 (two) times daily. Use in each nostril as directed   carisoprodol (SOMA) 350 MG tablet Take 1 tablet (350 mg total) by mouth 3 (three) times daily as needed for muscle spasms.   cholecalciferol (VITAMIN D3) 25 MCG (1000 UNIT) tablet Take 1,000 Units by mouth 2 (two) times daily.   Evolocumab (REPATHA SURECLICK) 140 MG/ML SOAJ Inject 140 mg into the skin every 14 (fourteen) days.   fluticasone (FLONASE SENSIMIST) 27.5 MCG/SPRAY nasal spray Place 2 sprays into the nose daily as needed for rhinitis or allergies.   icosapent Ethyl (VASCEPA) 1 g capsule Take 2 capsules (2 g total) by mouth 2 (two) times daily.   losartan (COZAAR) 25 MG tablet TAKE 1 TABLET(25 MG) BY MOUTH DAILY   melatonin 3 MG TABS tablet Take 3 mg by mouth at bedtime.   metoprolol succinate (TOPROL-XL) 25 MG 24 hr tablet Take 1 tablet (25 mg total) by mouth daily.   Multiple Vitamin (MULTIVITAMIN WITH MINERALS) TABS tablet Take 1 tablet by mouth daily.   Omega-3  Fatty Acids (OMEGA 3 PO) Take 1 capsule by mouth daily.   omeprazole (PRILOSEC) 20 MG capsule Take 1 capsule (20 mg total) by mouth daily.   OVER THE COUNTER MEDICATION Take 1 tablet by mouth 2 (two) times daily. Vivscal Advanced Hair Health   triamcinolone cream (KENALOG) 0.1 % Apply 1 Application topically 2 (two) times daily as needed.   pregabalin (LYRICA) 25 MG capsule Take 1 capsule (25 mg total) by mouth 2 (two) times daily as needed (Lumbar radiculopathy). (Patient not taking:  Reported on 11/28/2023)   No facility-administered encounter medications on file as of 11/28/2023.    Allergies (verified) Baclofen, Barley grass, Buckwheat, Cyclobenzaprine, Escitalopram oxalate, Iodine, Lyrica [pregabalin], Other, Rosuvastatin, Sertraline, Simvastatin, Vanilla, Atorvastatin, Codeine, Contrast media [iodinated contrast media], Hydrochlorothiazide-triamterene, Hydrocodone-acetaminophen, Maxzide [triamterene-hctz], and Tape   History: Past Medical History:  Diagnosis Date   Allergy In my teens   Anemia    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   Breast cancer (HCC) 2009   BRACA 1 Pos   Cataract 2000   Chronic midline low back pain without sciatica 02/18/2020   Coronary artery disease    mild   COVID-19 10/20/2022   Family history of adverse reaction to anesthesia    mother is slow to wake up   Fibromyalgia    GERD (gastroesophageal reflux disease) 02/18/2020   Hyperlipemia    Hypertension    Mitral valve prolapse    never has caused any problems per patient 12/04/22   Multiple drug allergies 02/18/2020   Myalgia due to statin 02/18/2020   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   MRI 05/2018   Osteopenia after menopause 02/11/2021   DEXA 01/2021 lowest T = -1.7 femur; recheck 2-3 years   Pneumonia    x 1 - years ago   PONV (postoperative nausea and vomiting)    after most recent colonoscopy (2020)   Past Surgical History:  Procedure Laterality Date   ABDOMINAL HYSTERECTOMY  2002   BREAST SURGERY     right 09/10/2007, left 06/16/2009   colonosocpy     several   FOOT BONE EXCISION Right 2021   at [facility]  - fusion heel bone   JOINT REPLACEMENT     Left shoulder   SHOULDER ARTHROTOMY Left 2013   and 06/2015   SPINE SURGERY  12/11/2022   TONSILLECTOMY  2012   TRANSFORAMINAL LUMBAR INTERBODY FUSION (TLIF) WITH PEDICLE SCREW FIXATION 1 LEVEL N/A 12/11/2022   Procedure: Lumbar five-Sacral one Laminectomy, Transforaminal Lumbar Interbody Fusion, posterolateral  instrumented fusion;  Surgeon: Jadene Pierini, MD;  Location: [facility] OR;  Service: Neurosurgery;  Laterality: N/A;   UPPER GI ENDOSCOPY     Family History  Problem Relation Age of Onset   Asthma Mother    Hypertension Mother    Colon polyps Mother    Gallstones Mother    Cancer Mother    Hearing loss Mother    Kidney disease Mother    Vision loss Mother    Allergic rhinitis Father    Stroke Father    Hypertension Brother    Gallstones Brother    Vision loss Brother    Hypertension Brother    Gallstones Brother    Vision loss Brother    Hypertension Brother    Gallstones Brother    Diabetes Brother    Social History   Socioeconomic History   Marital status: Single    Spouse name: Not on file   Number of children: Not on file  Years of education: Not on file   Highest education level: Bachelor's degree (e.g., BA, AB, BS)  Occupational History   Not on file  Tobacco Use   Smoking status: Never    Passive exposure: Never   Smokeless tobacco: Never   Tobacco comments:    Never used  Vaping Use   Vaping status: Never Used  Substance and Sexual Activity   Alcohol use: Not Currently    Alcohol/week: 1.0 standard drink of alcohol    Types: 1 Glasses of wine per week    Comment: Socially, Less than 10 drinks a year.   Drug use: Never   Sexual activity: Not Currently    Partners: Male    Birth control/protection: None    Comment: Hysterectomy  Other Topics Concern   Not on file  Social History Narrative   Single   Social Drivers of Health   Financial Resource Strain: Low Risk  (11/28/2023)   Overall Financial Resource Strain (CARDIA)    Difficulty of Paying Living Expenses: Not hard at all  Food Insecurity: No Food Insecurity (11/28/2023)   Hunger Vital Sign    Worried About Running Out of Food in the Last Year: Never true    Ran Out of Food in the Last Year: Never true  Transportation Needs: No Transportation Needs (11/28/2023)   PRAPARE - Doctor, general practice (Medical): No    Lack of Transportation (Non-Medical): No  Physical Activity: Sufficiently Active (11/28/2023)   Exercise Vital Sign    Days of Exercise per Week: 5 days    Minutes of Exercise per Session: 30 min  Recent Concern: Physical Activity - Insufficiently Active (11/28/2023)   Exercise Vital Sign    Days of Exercise per Week: 2 days    Minutes of Exercise per Session: 30 min  Stress: No Stress Concern Present (11/28/2023)   [employer] of Occupational Health - Occupational Stress Questionnaire    Feeling of Stress : Not at all  Social Connections: Moderately Isolated (11/28/2023)   Social Connection and Isolation Panel [NHANES]    Frequency of Communication with Friends and Family: More than three times a week    Frequency of Social Gatherings with Friends and Family: More than three times a week    Attends Religious Services: Never    Database administrator or Organizations: Yes    Attends Engineer, structural: More than 4 times per year    Marital Status: Never married    Tobacco Counseling Counseling given: No Tobacco comments: Never used    Clinical Intake:  Pre-visit preparation completed: Yes  Pain : No/denies pain     BMI - recorded: 28.32 Nutritional Status: BMI 25 -29 Overweight Nutritional Risks: None Diabetes: No  Lab Results  Component Value Date   HGBA1C 6.1 09/10/2023     How often do you need to have someone help you when you read instructions, pamphlets, or other written materials from your doctor or pharmacy?: 1 - Never  Interpreter Needed?: No  Information entered by :: Hassell Halim, CMA   Activities of Daily Living     11/28/2023   10:43 AM 12/04/2022    8:32 AM  In your present state of health, do you have any difficulty performing the following activities:  Hearing? 0   Vision? 0   Difficulty concentrating or making decisions? 0   Walking or climbing stairs? 0   Dressing or bathing? 0   Doing  errands, shopping? 0  0  Preparing Food and eating ? N   Using the Toilet? N   In the past six months, have you accidently leaked urine? N   Do you have problems with loss of bowel control? N   Managing your Medications? N   Managing your Finances? N   Housekeeping or managing your Housekeeping? N     Patient Care Team: Pincus Sanes, MD as PCP - General (Internal Medicine) Harmon Dun, MD as Referring Physician (Orthopedic Surgery) Stechschulte, Hyman Hopes, MD as Consulting Physician (Surgery) Alver Sorrow, NP as Nurse Practitioner (Cardiology)  Indicate any recent Medical Services you may have received from other than [facility] providers in the past year (date may be approximate).     Assessment:   This is a routine wellness examination for [place].  Hearing/Vision screen Hearing Screening - Comments:: Denies hearing difficulties   Vision Screening - Comments:: Wears rx glasses - up to date with routine eye exams with Dr Boykin Reaper   Goals Addressed               This Visit's Progress     Patient Stated (pt-stated)        Patient stated she plans to continue to lose weight and watch diet and A1C level.       Depression Screen     11/28/2023   10:56 AM 11/09/2023   10:35 AM 03/19/2023    9:53 AM 10/10/2022    9:10 AM 09/11/2022    8:57 AM 03/02/2022    9:59 AM 02/18/2021   12:03 PM  PHQ 2/9 Scores  PHQ - 2 Score 0 0 0 0 0 0 0  PHQ- 9 Score 0          Fall Risk     11/28/2023   10:43 AM 11/09/2023   10:34 AM 03/19/2023    9:53 AM 10/10/2022    9:12 AM 09/11/2022    8:57 AM  Fall Risk   Falls in the past year? 0 0 0 0 0  Number falls in past yr: 0 0 0 0 0  Injury with Fall? 0 0 0 0 0  Risk for fall due to : No Fall Risks No Fall Risks No Fall Risks Impaired vision;Impaired balance/gait No Fall Risks  Follow up Falls prevention discussed;Falls evaluation completed Falls evaluation completed Falls evaluation completed Falls prevention discussed Falls evaluation completed     MEDICARE RISK AT HOME:  Medicare Risk at Home Any stairs in or around the home?: Yes If so, are there any without handrails?: No Home free of loose throw rugs in walkways, pet beds, electrical cords, etc?: Yes Adequate lighting in your home to reduce risk of falls?: Yes Life alert?: No Use of a cane, walker or w/c?: No Grab bars in the bathroom?: No Shower chair or bench in shower?: Yes Elevated toilet seat or a handicapped toilet?: Yes  TIMED UP AND GO:  Was the test performed?  No  Cognitive Function: 6CIT completed        11/28/2023   10:46 AM 10/10/2022    9:13 AM 02/18/2021   12:12 PM  6CIT Screen  What Year? 0 points 0 points 0 points  What month? 0 points 0 points 0 points  What time? 0 points 0 points 0 points  Count back from 20 0 points 0 points 0 points  Months in reverse 0 points 0 points 0 points  Repeat phrase 0 points 0 points 0 points  Total Score  0 points 0 points 0 points    Immunizations Immunization History  Administered Date(s) Administered   Fluad Quad(high Dose 65+) 04/28/2020, 07/05/2022   Influenza Whole 05/22/2011   Influenza-Unspecified 06/26/2015, 05/15/2021   PFIZER(Purple Top)SARS-COV-2 Vaccination 10/03/2019, 10/28/2019, 05/28/2020, 03/24/2021   Pfizer Covid-19 Vaccine Bivalent Booster 65yrs & up 02/28/2023   Pfizer(Comirnaty)Fall Seasonal Vaccine 12 years and older 07/06/2023   Pneumococcal Conjugate-13 03/17/2015   Pneumococcal Polysaccharide-23 01/14/2009, 11/29/2020   Zoster Recombinant(Shingrix) 04/05/2020, 10/03/2021    Screening Tests Health Maintenance  Topic Date Due   COVID-19 Vaccine (7 - Pfizer risk 2024-25 season) 01/03/2024   INFLUENZA VACCINE  03/21/2024   Colonoscopy  10/21/2024   Medicare Annual Wellness (AWV)  11/27/2024   DEXA SCAN  10/30/2025   Pneumonia Vaccine 13+ Years old  Completed   Hepatitis C Screening  Completed   Zoster Vaccines- Shingrix  Completed   HPV VACCINES  Aged Out   DTaP/Tdap/Td   Discontinued    Health Maintenance  There are no preventive care reminders to display for this patient. Health Maintenance Items Addressed: 11/28/2023   Additional Screening:  Vision Screening: Recommended annual ophthalmology exams for early detection of glaucoma and other disorders of the eye. Pt stated she sees Dr Boykin Reaper of [place], [place] for annual eye exams.  Dental Screening: Recommended annual dental exams for proper oral hygiene  Community Resource Referral / Chronic Care Management: CRR required this visit?  No   CCM required this visit?  No     Plan:     I have personally reviewed and noted the following in the patient's chart:   Medical and social history Use of alcohol, tobacco or illicit drugs  Current medications and supplements including opioid prescriptions. Patient is not currently taking opioid prescriptions. Functional ability and status Nutritional status Physical activity Advanced directives List of other physicians Hospitalizations, surgeries, and ER visits in previous 12 months Vitals Screenings to include cognitive, depression, and falls Referrals and appointments  In addition, I have reviewed and discussed with patient certain preventive protocols, quality metrics, and best practice recommendations. A written personalized care plan for preventive services as well as general preventive health recommendations were provided to patient.     Darreld Mclean, CMA   11/28/2023   After Visit Summary: (MyChart) Due to this being a telephonic visit, the after visit summary with patients personalized plan was offered to patient via MyChart   Notes: Nothing significant to report at this time.

## 2023-11-28 NOTE — Patient Instructions (Addendum)
 Shannon Osborne , Thank you for taking time to come for your Medicare Wellness Visit. I appreciate your ongoing commitment to your health goals. Please review the following plan we discussed and let me know if I can assist you in the future.   Referrals/Orders/Follow-Ups/Clinician Recommendations: Aim for 30 minutes of exercise or brisk walking, 6-8 glasses of water, and 5 servings of fruits and vegetables each day.   This is a list of the screening recommended for you and due dates:  Health Maintenance  Topic Date Due   COVID-19 Vaccine (7 - Pfizer risk 2024-25 season) 01/03/2024   Flu Shot  03/21/2024   Colon Cancer Screening  10/21/2024   Medicare Annual Wellness Visit  11/27/2024   DEXA scan (bone density measurement)  10/30/2025   Pneumonia Vaccine  Completed   Hepatitis C Screening  Completed   Zoster (Shingles) Vaccine  Completed   HPV Vaccine  Aged Out   DTaP/Tdap/Td vaccine  Discontinued    Advanced directives: (Copy Requested) Please bring a copy of your health care power of attorney and living will to the office to be added to your chart at your convenience. You can mail to [facility] [address] [place], [place] [address] or email to [email or web address]  Next Medicare Annual Wellness Visit scheduled for next year: Yes

## 2023-12-01 ENCOUNTER — Encounter: Payer: Self-pay | Admitting: Internal Medicine

## 2023-12-03 ENCOUNTER — Ambulatory Visit: Payer: Medicare Other | Admitting: Physical Therapy

## 2023-12-10 ENCOUNTER — Encounter: Payer: Medicare Other | Admitting: Physical Therapy

## 2023-12-17 ENCOUNTER — Encounter: Payer: Medicare Other | Admitting: Physical Therapy

## 2024-01-09 ENCOUNTER — Encounter: Admitting: Physical Therapy

## 2024-01-18 ENCOUNTER — Encounter: Admitting: Physical Therapy

## 2024-01-23 ENCOUNTER — Telehealth (HOSPITAL_BASED_OUTPATIENT_CLINIC_OR_DEPARTMENT_OTHER): Payer: Self-pay | Admitting: Family

## 2024-01-23 MED ORDER — LOSARTAN POTASSIUM 25 MG PO TABS: 25.0000 mg | ORAL_TABLET | Freq: Every day | ORAL | 3 refills | Status: AC

## 2024-01-23 NOTE — Telephone Encounter (Signed)
 *  STAT* If patient is at the pharmacy, call can be transferred to refill team.   1. Which medications need to be refilled? (please list name of each medication and dose if known)   losartan  (COZAAR ) 25 MG tablet     2. Would you like to learn more about the convenience, safety, & potential cost savings by using the [facility]? No    3. Are you open to using the [facility] (Type [facility]. ). No    4. Which pharmacy/location (including street and city if local pharmacy) is medication to be sent to? [id number] - [place], [place] - [address]    5. Do they need a 30 day or 90 day supply? 90 days

## 2024-01-23 NOTE — Telephone Encounter (Signed)
 RX sent to requested Pharmacy

## 2024-02-19 ENCOUNTER — Telehealth: Payer: Self-pay

## 2024-02-19 NOTE — Telephone Encounter (Signed)
 Called [email or web address] program referral to discuss July class schedule at [facility]; left voicemail requesting return call.

## 2024-03-05 NOTE — Progress Notes (Signed)
 Subjective:    Patient ID: Shannon Osborne, female    DOB: 02-Aug-1956, 68 y.o.   MRN: [id number]     HPI Mayrene is here for follow up of her chronic medical problems.  [facility]  Eating better -- not losing weight.  Saw a nutritionist and was told she was not eating enough.  Eating about 1200 cal a day.  Keeping track of all her nutrition  Having right hip pain.  Has pain in lateral right buttock and anterior- lateral hip pain.    Riding bike.  40 minutes.  Stretching, weights/bands   Medications and allergies reviewed with patient and updated if appropriate.  Current Outpatient Medications on File Prior to Visit  Medication Sig Dispense Refill   aspirin  EC 81 MG tablet Take 1 tablet (81 mg total) by mouth daily. Swallow whole. 30 tablet 11   azelastine  (ASTELIN ) 0.1 % nasal spray Place 1 spray into both nostrils 2 (two) times daily. Use in each nostril as directed     carisoprodol  (SOMA ) 350 MG tablet Take 1 tablet (350 mg total) by mouth 3 (three) times daily as needed for muscle spasms. 90 tablet 0   cholecalciferol (VITAMIN D3) 25 MCG (1000 UNIT) tablet Take 1,000 Units by mouth 2 (two) times daily.     Evolocumab  (REPATHA  SURECLICK) 140 MG/ML SOAJ Inject 140 mg into the skin every 14 (fourteen) days. 6 mL 3   fluticasone  (FLONASE  SENSIMIST) 27.5 MCG/SPRAY nasal spray Place 2 sprays into the nose daily as needed for rhinitis or allergies.     icosapent  Ethyl (VASCEPA ) 1 g capsule Take 2 capsules (2 g total) by mouth 2 (two) times daily. 360 capsule 3   losartan  (COZAAR ) 25 MG tablet Take 1 tablet (25 mg total) by mouth daily. 90 tablet 3   melatonin 3 MG TABS tablet Take 3 mg by mouth at bedtime.     metoprolol  succinate (TOPROL -XL) 25 MG 24 hr tablet Take 1 tablet (25 mg total) by mouth daily. 90 tablet 1   Multiple Vitamin (MULTIVITAMIN WITH MINERALS) TABS tablet Take 1 tablet by mouth daily.     Omega-3 Fatty Acids (OMEGA 3 PO) Take 1 capsule by  mouth daily.     omeprazole  (PRILOSEC) 20 MG capsule Take 1 capsule (20 mg total) by mouth daily.     OVER THE COUNTER MEDICATION Take 1 tablet by mouth 2 (two) times daily. Vivscal Advanced Hair Health     triamcinolone  cream (KENALOG ) 0.1 % Apply 1 Application topically 2 (two) times daily as needed. 454 g 0   pregabalin  (LYRICA ) 25 MG capsule Take 1 capsule (25 mg total) by mouth 2 (two) times daily as needed (Lumbar radiculopathy). (Patient not taking: Reported on 03/10/2024)     No current facility-administered medications on file prior to visit.     Review of Systems  Constitutional:  Negative for fever.  Respiratory:  Positive for cough. Negative for shortness of breath and wheezing.   Cardiovascular:  Negative for chest pain, palpitations and leg swelling.  Neurological:  Negative for light-headedness and headaches.       Objective:   Vitals:   03/10/24 0800  BP: 138/72  Pulse: 67  Temp: 98 F (36.7 C)  SpO2: 99%   BP Readings from Last 3 Encounters:  03/10/24 138/72  11/12/23 138/70  11/09/23 126/80   Wt Readings from Last 3 Encounters:  03/10/24 141 lb (64 kg)  11/28/23 142 lb  9.6 oz (64.7 kg)  11/12/23 146 lb 12.8 oz (66.6 kg)   Body mass index is 27.95 kg/m.    Physical Exam Constitutional:      General: She is not in acute distress.    Appearance: Normal appearance.  HENT:     Head: Normocephalic and atraumatic.  Eyes:     Conjunctiva/sclera: Conjunctivae normal.  Cardiovascular:     Rate and Rhythm: Normal rate and regular rhythm.     Heart sounds: Normal heart sounds.  Pulmonary:     Effort: Pulmonary effort is normal. No respiratory distress.     Breath sounds: Normal breath sounds. No wheezing.  Musculoskeletal:     Cervical back: Neck supple.     Right lower leg: No edema.     Left lower leg: No edema.  Lymphadenopathy:     Cervical: No cervical adenopathy.  Skin:    General: Skin is warm and dry.     Findings: No rash.  Neurological:      Mental Status: She is alert. Mental status is at baseline.  Psychiatric:        Mood and Affect: Mood normal.        Behavior: Behavior normal.        Lab Results  Component Value Date   WBC 5.5 09/10/2023   HGB 12.4 09/10/2023   HCT 37.4 09/10/2023   PLT 279.0 09/10/2023   GLUCOSE 92 09/10/2023   CHOL 157 09/10/2023   TRIG 229.0 (H) 09/10/2023   HDL 59.60 09/10/2023   LDLDIRECT 51.0 03/12/2023   LDLCALC 51 09/10/2023   ALT 21 09/10/2023   AST 25 09/10/2023   NA 138 09/10/2023   K 4.0 09/10/2023   CL 101 09/10/2023   CREATININE 0.56 09/10/2023   BUN 19 09/10/2023   CO2 29 09/10/2023   TSH 2.93 03/12/2023   HGBA1C 6.1 09/10/2023     Assessment & Plan:    See Problem List for Assessment and Plan of chronic medical problems.

## 2024-03-05 NOTE — Patient Instructions (Addendum)
      Blood work was ordered.       Medications changes include :   None    A referral was ordered Dr Claudene downstairs for your right hip pain and someone will call you to schedule an appointment or you can make the appointment downstairs.     Return in about 6 months (around 09/10/2024) for follow up.

## 2024-03-10 ENCOUNTER — Ambulatory Visit (INDEPENDENT_AMBULATORY_CARE_PROVIDER_SITE_OTHER): Admitting: Internal Medicine

## 2024-03-10 ENCOUNTER — Encounter: Payer: Self-pay | Admitting: Internal Medicine

## 2024-03-10 VITALS — BP 138/72 | HR 67 | Temp 98.0°F | Ht 59.56 in | Wt 141.0 lb

## 2024-03-10 DIAGNOSIS — E559 Vitamin D deficiency, unspecified: Secondary | ICD-10-CM | POA: Diagnosis not present

## 2024-03-10 DIAGNOSIS — E782 Mixed hyperlipidemia: Secondary | ICD-10-CM | POA: Diagnosis not present

## 2024-03-10 DIAGNOSIS — K219 Gastro-esophageal reflux disease without esophagitis: Secondary | ICD-10-CM | POA: Diagnosis not present

## 2024-03-10 DIAGNOSIS — M8580 Other specified disorders of bone density and structure, unspecified site: Secondary | ICD-10-CM

## 2024-03-10 DIAGNOSIS — R7303 Prediabetes: Secondary | ICD-10-CM

## 2024-03-10 DIAGNOSIS — M5416 Radiculopathy, lumbar region: Secondary | ICD-10-CM

## 2024-03-10 DIAGNOSIS — I10 Essential (primary) hypertension: Secondary | ICD-10-CM

## 2024-03-10 DIAGNOSIS — Z77090 Contact with and (suspected) exposure to asbestos: Secondary | ICD-10-CM

## 2024-03-10 DIAGNOSIS — I2510 Atherosclerotic heart disease of native coronary artery without angina pectoris: Secondary | ICD-10-CM

## 2024-03-10 DIAGNOSIS — M25551 Pain in right hip: Secondary | ICD-10-CM

## 2024-03-10 DIAGNOSIS — R252 Cramp and spasm: Secondary | ICD-10-CM

## 2024-03-10 DIAGNOSIS — I5189 Other ill-defined heart diseases: Secondary | ICD-10-CM | POA: Insufficient documentation

## 2024-03-10 LAB — COMPREHENSIVE METABOLIC PANEL WITH GFR
ALT: 14 U/L (ref 0–35)
AST: 17 U/L (ref 0–37)
Albumin: 4.6 g/dL (ref 3.5–5.2)
Alkaline Phosphatase: 62 U/L (ref 39–117)
BUN: 18 mg/dL (ref 6–23)
CO2: 28 meq/L (ref 19–32)
Calcium: 9.6 mg/dL (ref 8.4–10.5)
Chloride: 103 meq/L (ref 96–112)
Creatinine, Ser: 0.59 mg/dL (ref 0.40–1.20)
GFR: 91.45 mL/min (ref >60.00)
Glucose, Bld: 109 mg/dL — ABNORMAL HIGH (ref 70–99)
Potassium: 4.6 meq/L (ref 3.5–5.1)
Sodium: 139 meq/L (ref 135–145)
Total Bilirubin: 0.6 mg/dL (ref 0.2–1.2)
Total Protein: 7.1 g/dL (ref 6.0–8.3)

## 2024-03-10 LAB — VITAMIN D 25 HYDROXY (VIT D DEFICIENCY, FRACTURES): VITD: 43.95 ng/mL (ref 30.00–100.00)

## 2024-03-10 LAB — LIPID PANEL
Cholesterol: 183 mg/dL (ref 0–200)
HDL: 48.6 mg/dL (ref >39.00)
LDL Cholesterol: 87 mg/dL (ref 0–99)
NonHDL: 134.23
Total CHOL/HDL Ratio: 4
Triglycerides: 236 mg/dL — ABNORMAL HIGH (ref 0.0–149.0)
VLDL: 47.2 mg/dL — ABNORMAL HIGH (ref 0.0–40.0)

## 2024-03-10 LAB — HEMOGLOBIN A1C: Hgb A1c MFr Bld: 5.8 % (ref 4.6–6.5)

## 2024-03-10 NOTE — Assessment & Plan Note (Signed)
 Chronic DEXA up-to-date Continue 1200 mg of calcium a day-combination of supplements / food Continue vitamin D  daily Check vitamin D  level Stressed regular exercise-weightbearing, resistance No need for medication at this time-FRAX is low DEXA every 2 years

## 2024-03-10 NOTE — Assessment & Plan Note (Signed)
 Chronic GERD controlled Does not drink any soda, chocolate, tea Continue omeprazole  20 mg daily prn

## 2024-03-10 NOTE — Assessment & Plan Note (Signed)
 Chronic Blood pressure well controlled CMP Continue metoprolol  XL 25 mg daily, losartan  25 mg daily

## 2024-03-10 NOTE — Assessment & Plan Note (Signed)
 Chronic Following with cardiology-Dr. Lonni Regular exercise and healthy diet  Check lipid panel, CMP Continue Repatha , Zetia , Vascepa 

## 2024-03-10 NOTE — Assessment & Plan Note (Signed)
 Chronic Following with cardiology-Dr. Lonni Denies any symptoms consistent with angina BP well-controlled Continue aspirin  81 mg daily, Repatha  q. 14 days, Zetia , vascepa , metoprolol 

## 2024-03-10 NOTE — Assessment & Plan Note (Signed)
 History of asbestos exposure Chest x-ray recommended annually CT chest done 2024 Last chest x-ray 08/2023-normal No concerning symptoms Will continue chest x-ray annually

## 2024-03-10 NOTE — Assessment & Plan Note (Signed)
Chronic No evidence of heart failure

## 2024-03-10 NOTE — Assessment & Plan Note (Signed)
 Chronic Lab Results  Component Value Date   HGBA1C 6.1 09/10/2023   Check a1c Low sugar / carb diet Continue regular exercise

## 2024-03-10 NOTE — Assessment & Plan Note (Signed)
 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

## 2024-03-10 NOTE — Assessment & Plan Note (Signed)
 Chronic Intermittent, infrequent  No obvious cause Can be lower legs, back muscles Likely related to her back issues Currently seeing a chiropractor monthly for maintenance Takes soma  350 mg tid as needed-continue

## 2024-03-10 NOTE — Assessment & Plan Note (Signed)
 Chronic Taking vitamin d daily Check vitamin d level

## 2024-03-12 ENCOUNTER — Ambulatory Visit: Payer: Self-pay | Admitting: Internal Medicine

## 2024-03-19 ENCOUNTER — Ambulatory Visit: Payer: Medicare Other | Admitting: Family Medicine

## 2024-03-28 ENCOUNTER — Encounter: Payer: Self-pay | Admitting: Internal Medicine

## 2024-03-31 ENCOUNTER — Other Ambulatory Visit: Payer: Self-pay

## 2024-03-31 MED ORDER — METOPROLOL SUCCINATE ER 25 MG PO TB24: 25.0000 mg | ORAL_TABLET | Freq: Every day | ORAL | 0 refills | Status: DC

## 2024-04-18 ENCOUNTER — Other Ambulatory Visit: Payer: Self-pay | Admitting: Internal Medicine

## 2024-04-24 NOTE — Progress Notes (Unsigned)
 [facility] [address] [place] [address] Phone: [phone] Subjective:   [name], am serving as a scribe for Dr. Arthea Claudene.  I'm seeing this patient by the request  of:  Geofm Glade PARAS, MD  CC: right hip pain   YEP:Dlagzrupcz  [facility] is a 68 y.o. female coming in with complaint of R hip pain. Patient states that she is having R GT pain. Pain can be sharp at night when lying on her side. Has had injections in this area which helps minimally.   Also has pain on R proximal hamstring pain that radiates down leg to medial hamstring. Pain is 4/5 out of 10. Hx of surgery on L5/S1. Had EMG that showed that she had nerve issue at L4/L5.   When she bikes over 40 minutes she develops pain in anterior hip and then swelling increases over GT.       Past Medical History:  Diagnosis Date   Allergy In my teens   Anemia    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   Breast cancer (HCC) 2009   BRACA 1 Pos   Cataract 2000   Chronic midline low back pain without sciatica 02/18/2020   Coronary artery disease    mild   COVID-19 10/20/2022   Family history of adverse reaction to anesthesia    mother is slow to wake up   Fibromyalgia    GERD (gastroesophageal reflux disease) 02/18/2020   Hyperlipemia    Hypertension    Mitral valve prolapse    never has caused any problems per patient 12/04/22   Multiple drug allergies 02/18/2020   Myalgia due to statin 02/18/2020   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   MRI 05/2018   Osteopenia after menopause 02/11/2021   DEXA 01/2021 lowest T = -1.7 femur; recheck 2-3 years   Pneumonia    x 1 - years ago   PONV (postoperative nausea and vomiting)    after most recent colonoscopy (2020)   Past Surgical History:  Procedure Laterality Date   ABDOMINAL HYSTERECTOMY  2002   BREAST SURGERY     right 09/10/2007, left 06/16/2009   colonosocpy     several   FOOT BONE EXCISION Right 2021   at  [facility]  - fusion heel bone   JOINT REPLACEMENT     Left shoulder   SHOULDER ARTHROTOMY Left 2013   and 06/2015   SPINE SURGERY  12/11/2022   TONSILLECTOMY  2012   TRANSFORAMINAL LUMBAR INTERBODY FUSION (TLIF) WITH PEDICLE SCREW FIXATION 1 LEVEL N/A 12/11/2022   Procedure: Lumbar five-Sacral one Laminectomy, Transforaminal Lumbar Interbody Fusion, posterolateral instrumented fusion;  Surgeon: Cheryle Debby LABOR, MD;  Location: [facility] OR;  Service: Neurosurgery;  Laterality: N/A;   UPPER GI ENDOSCOPY     Social History   Socioeconomic History   Marital status: Single    Spouse name: Not on file   Number of children: Not on file   Years of education: Not on file   Highest education level: Bachelor's degree (e.g., BA, AB, BS)  Occupational History   Not on file  Tobacco Use   Smoking status: Never    Passive exposure: Never   Smokeless tobacco: Never   Tobacco comments:    Never used  Vaping Use   Vaping status: Never Used  Substance and Sexual Activity   Alcohol use: Not Currently    Alcohol/week: 1.0 standard drink of alcohol    Types: 1 Glasses of  wine per week    Comment: Socially, Less than 10 drinks a year.   Drug use: Never   Sexual activity: Not Currently    Partners: Male    Birth control/protection: None    Comment: Hysterectomy  Other Topics Concern   Not on file  Social History Narrative   Single   Social Drivers of Health   Financial Resource Strain: Low Risk  (11/28/2023)   Overall Financial Resource Strain (CARDIA)    Difficulty of Paying Living Expenses: Not hard at all  Food Insecurity: No Food Insecurity (11/28/2023)   Hunger Vital Sign    Worried About Running Out of Food in the Last Year: Never true    Ran Out of Food in the Last Year: Never true  Transportation Needs: No Transportation Needs (11/28/2023)   PRAPARE - Administrator, Civil Service (Medical): No    Lack of Transportation (Non-Medical): No  Physical Activity: Sufficiently Active  (11/28/2023)   Exercise Vital Sign    Days of Exercise per Week: 5 days    Minutes of Exercise per Session: 30 min  Recent Concern: Physical Activity - Insufficiently Active (11/28/2023)   Exercise Vital Sign    Days of Exercise per Week: 2 days    Minutes of Exercise per Session: 30 min  Stress: No Stress Concern Present (11/28/2023)   [employer] of Occupational Health - Occupational Stress Questionnaire    Feeling of Stress : Not at all  Social Connections: Moderately Isolated (11/28/2023)   Social Connection and Isolation Panel    Frequency of Communication with Friends and Family: More than three times a week    Frequency of Social Gatherings with Friends and Family: More than three times a week    Attends Religious Services: Never    Database administrator or Organizations: Yes    Attends Engineer, structural: More than 4 times per year    Marital Status: Never married   Allergies  Allergen Reactions   Baclofen Other (See Comments)    insomnia   Barley Grass Cough   Buckwheat Cough   Cyclobenzaprine Other (See Comments)    Insomnia   Escitalopram Oxalate Other (See Comments)    Unknown reaction    Iodine Nausea And Vomiting   Lyrica  [Pregabalin ]     Couldn't focus   Other     Rye - cough Beer - sinus congestion  E953 isomalt - cough   Rosuvastatin Other (See Comments)    myalgia   Sertraline Other (See Comments)    Makes depression worse   Simvastatin Other (See Comments)    myalgia   Vanilla Cough   Atorvastatin Other (See Comments)    myalgia   Codeine Rash   Contrast Media [Iodinated Contrast Media] Rash    Chest pressure    Hydrochlorothiazide-Triamterene Rash   Hydrocodone-Acetaminophen  Rash   Maxzide [Triamterene-Hctz] Rash   Tape Itching, Rash and Swelling   Family History  Problem Relation Age of Onset   Asthma Mother    Hypertension Mother    Colon polyps Mother    Gallstones Mother    Cancer Mother    Hearing loss Mother    Kidney  disease Mother    Vision loss Mother    Allergic rhinitis Father    Stroke Father    Hypertension Brother    Gallstones Brother    Vision loss Brother    Hypertension Brother    Gallstones Brother    Vision loss  Brother    Hypertension Brother    Gallstones Brother    Diabetes Brother      Current Outpatient Medications (Cardiovascular):    Evolocumab  (REPATHA  SURECLICK) 140 MG/ML SOAJ, Inject 140 mg into the skin every 14 (fourteen) days.   icosapent  Ethyl (VASCEPA ) 1 g capsule, Take 2 capsules (2 g total) by mouth 2 (two) times daily.   losartan  (COZAAR ) 25 MG tablet, Take 1 tablet (25 mg total) by mouth daily.   metoprolol  succinate (TOPROL -XL) 25 MG 24 hr tablet, TAKE 1 TABLET(25 MG) BY MOUTH DAILY  Current Outpatient Medications (Respiratory):    azelastine  (ASTELIN ) 0.1 % nasal spray, Place 1 spray into both nostrils 2 (two) times daily. Use in each nostril as directed   fluticasone  (FLONASE  SENSIMIST) 27.5 MCG/SPRAY nasal spray, Place 2 sprays into the nose daily as needed for rhinitis or allergies.  Current Outpatient Medications (Analgesics):    aspirin  EC 81 MG tablet, Take 1 tablet (81 mg total) by mouth daily. Swallow whole.   Current Outpatient Medications (Other):    carisoprodol  (SOMA ) 350 MG tablet, Take 1 tablet (350 mg total) by mouth 3 (three) times daily as needed for muscle spasms.   cholecalciferol (VITAMIN D3) 25 MCG (1000 UNIT) tablet, Take 1,000 Units by mouth 2 (two) times daily.   melatonin 3 MG TABS tablet, Take 3 mg by mouth at bedtime.   Multiple Vitamin (MULTIVITAMIN WITH MINERALS) TABS tablet, Take 1 tablet by mouth daily.   Omega-3 Fatty Acids (OMEGA 3 PO), Take 1 capsule by mouth daily.   omeprazole  (PRILOSEC) 20 MG capsule, Take 1 capsule (20 mg total) by mouth daily.   OVER THE COUNTER MEDICATION, Take 1 tablet by mouth 2 (two) times daily. Vivscal [facility]   pregabalin  (LYRICA ) 25 MG capsule, Take 1 capsule (25 mg total) by mouth  2 (two) times daily as needed (Lumbar radiculopathy). (Patient not taking: Reported on 03/10/2024)   triamcinolone  cream (KENALOG ) 0.1 %, Apply 1 Application topically 2 (two) times daily as needed.   Reviewed prior external information including notes and imaging from  primary care provider As well as notes that were available from care everywhere and other healthcare systems.  Past medical history, social, surgical and family history all reviewed in electronic medical record.  No pertanent information unless stated regarding to the chief complaint.   Review of Systems:  No headache, visual changes, nausea, vomiting, diarrhea, constipation, dizziness, abdominal pain, skin rash, fevers, chills, night sweats, weight loss, swollen lymph nodes, body aches, joint swelling, chest pain, shortness of breath, mood changes. POSITIVE muscle aches  Objective  There were no vitals taken for this visit.   General: No apparent distress alert and oriented x3 mood and affect normal, dressed appropriately.  HEENT: Pupils equal, extraocular movements intact  Respiratory: Patient's speak in full sentences and does not appear short of breath  Cardiovascular: No lower extremity edema, non tender, no erythema   Right hip exam shows the patient is severely tender more over the gluteal tendon.  Worsening pain with abduction against resistance.  Some pain also with extension of the hip against resistance.  Good internal and external range of motion noted.  Arthritic changes noted of the right knee.  Neurovascularly mostly intact with some mild numbness noted on the dorsal aspect of the foot.   Impression and Recommendations:    The above documentation has been reviewed and is accurate and complete Caytlyn Evers M Amey Hossain, DO

## 2024-04-25 ENCOUNTER — Ambulatory Visit (INDEPENDENT_AMBULATORY_CARE_PROVIDER_SITE_OTHER)

## 2024-04-25 ENCOUNTER — Ambulatory Visit (INDEPENDENT_AMBULATORY_CARE_PROVIDER_SITE_OTHER): Admitting: Family Medicine

## 2024-04-25 ENCOUNTER — Encounter: Payer: Self-pay | Admitting: Family Medicine

## 2024-04-25 VITALS — BP 112/82 | HR 75 | Ht 59.0 in | Wt 142.0 lb

## 2024-04-25 DIAGNOSIS — M25551 Pain in right hip: Secondary | ICD-10-CM

## 2024-04-25 DIAGNOSIS — R978 Other abnormal tumor markers: Secondary | ICD-10-CM

## 2024-04-25 DIAGNOSIS — M545 Low back pain, unspecified: Secondary | ICD-10-CM | POA: Diagnosis not present

## 2024-04-25 DIAGNOSIS — Z853 Personal history of malignant neoplasm of breast: Secondary | ICD-10-CM | POA: Diagnosis not present

## 2024-04-25 LAB — SEDIMENTATION RATE: Sed Rate: 11 mm/h (ref 0–30)

## 2024-04-25 LAB — C-REACTIVE PROTEIN: CRP: <1 mg/dL (ref 0.5–20.0)

## 2024-04-25 LAB — VITAMIN B12: Vitamin B-12: 566 pg/mL (ref 211–911)

## 2024-04-25 NOTE — Patient Instructions (Addendum)
 MRI [facility] prescribed exercises at least 3x a week  Labs today See you again in 2 months

## 2024-04-25 NOTE — Assessment & Plan Note (Signed)
 Pain is somewhat out of proportion.  Patient has had significant workup of this right leg previously including a nerve conduction study that did show that there is some adjacent segment disease giving patient difficulty after the fusion in her back.  This could correspond also to some of the nerve impingement causing more of some gluteal weakness.  Discussed with patient with a history of breast cancer and the severity of the pain and before patient does any other back surgery I do feel that an MRI of the pelvis is necessary.  Will get some laboratory workup to make sure we optimize everything else at the moment.  Discussed icing regimen and home exercises.  Increase activity slowly.  Discussed icing regimen.  After the MRI we will discuss whether treatment options are available.  I am concerned that there is a gluteal tear that could be potentially contributing as well.

## 2024-04-28 LAB — ANTI-NUCLEAR AB-TITER (ANA TITER): ANA Titer 1: 1:40 {titer} — ABNORMAL HIGH

## 2024-04-28 LAB — CA 125: CA 125: 4 U/mL (ref <35)

## 2024-04-28 LAB — RHEUMATOID FACTOR: Rheumatoid fact SerPl-aCnc: <10 [IU]/mL (ref <14)

## 2024-04-28 LAB — ANGIOTENSIN CONVERTING ENZYME: Angiotensin-Converting Enzyme: 34 U/L (ref 9–67)

## 2024-04-28 LAB — ANA: Anti Nuclear Antibody (ANA): POSITIVE — AB

## 2024-04-29 ENCOUNTER — Ambulatory Visit: Payer: Self-pay | Admitting: Family Medicine

## 2024-05-05 ENCOUNTER — Ambulatory Visit (INDEPENDENT_AMBULATORY_CARE_PROVIDER_SITE_OTHER)

## 2024-05-05 DIAGNOSIS — M25551 Pain in right hip: Secondary | ICD-10-CM | POA: Diagnosis not present

## 2024-05-05 DIAGNOSIS — Z853 Personal history of malignant neoplasm of breast: Secondary | ICD-10-CM

## 2024-06-10 ENCOUNTER — Inpatient Hospital Stay: Payer: Medicare Other | Admitting: Hematology and Oncology

## 2024-06-24 NOTE — Progress Notes (Unsigned)
 [name] [address] [place] [address] Phone: [phone] Subjective:   ISusannah Gully, am serving as a scribe for Dr. Arthea Claudene.  I'm seeing this patient by the request  of:  Geofm Glade PARAS, MD  CC: Right hip pain  YEP:Dlagzrupcz  04/25/2024 Pain is somewhat out of proportion.  Patient has had significant workup of this right leg previously including a nerve conduction study that did show that there is some adjacent segment disease giving patient difficulty after the fusion in her back.  This could correspond also to some of the nerve impingement causing more of some gluteal weakness.  Discussed with patient with a history of breast cancer and the severity of the pain and before patient does any other back surgery I do feel that an MRI of the pelvis is necessary.  Will get some laboratory workup to make sure we optimize everything else at the moment.  Discussed icing regimen and home exercises.  Increase activity slowly.  Discussed icing regimen.  After the MRI we will discuss whether treatment options are available.  I am concerned that there is a gluteal tear that could be potentially contributing as well.      Update 06/26/2024 Yatziri Wainwright is a 68 y.o. female coming in with complaint of lumbar spine and R hip pain. Patient states does exercises and there were some that  MRI pelvis 05/05/2024 IMPRESSION: 1. No evidence of osseous metastatic disease or other acute osseous findings. 2. Mild degenerative changes of both hips. 3. Asymmetric gluteus minimus tendinosis on the right with surrounding soft tissue edema. 4. Grossly stable postsurgical and degenerative changes in the lower lumbar spine.    Past Medical History:  Diagnosis Date   Allergy In my teens   Anemia    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   Breast cancer (HCC) 2009   BRACA 1 Pos   Cataract 2000   Chronic midline low back pain without sciatica 02/18/2020   Coronary  artery disease    mild   COVID-19 10/20/2022   Family history of adverse reaction to anesthesia    mother is slow to wake up   Fibromyalgia    GERD (gastroesophageal reflux disease) 02/18/2020   Hyperlipemia    Hypertension    Mitral valve prolapse    never has caused any problems per patient 12/04/22   Multiple drug allergies 02/18/2020   Myalgia due to statin 02/18/2020   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   MRI 05/2018   Osteopenia after menopause 02/11/2021   DEXA 01/2021 lowest T = -1.7 femur; recheck 2-3 years   Pneumonia    x 1 - years ago   PONV (postoperative nausea and vomiting)    after most recent colonoscopy (2020)   Past Surgical History:  Procedure Laterality Date   ABDOMINAL HYSTERECTOMY  2002   BREAST SURGERY     right 09/10/2007, left 06/16/2009   colonosocpy     several   FOOT BONE EXCISION Right 2021   at [facility]  - fusion heel bone   JOINT REPLACEMENT     Left shoulder   SHOULDER ARTHROTOMY Left 2013   and 06/2015   SPINE SURGERY  12/11/2022   TONSILLECTOMY  2012   TRANSFORAMINAL LUMBAR INTERBODY FUSION (TLIF) WITH PEDICLE SCREW FIXATION 1 LEVEL N/A 12/11/2022   Procedure: Lumbar five-Sacral one Laminectomy, Transforaminal Lumbar Interbody Fusion, posterolateral instrumented fusion;  Surgeon: Cheryle Debby LABOR, MD;  Location: [facility] OR;  Service: Neurosurgery;  Laterality: N/A;   UPPER GI ENDOSCOPY     Social History   Socioeconomic History   Marital status: Single    Spouse name: Not on file   Number of children: Not on file   Years of education: Not on file   Highest education level: Bachelor's degree (e.g., BA, AB, BS)  Occupational History   Not on file  Tobacco Use   Smoking status: Never    Passive exposure: Never   Smokeless tobacco: Never   Tobacco comments:    Never used  Vaping Use   Vaping status: Never Used  Substance and Sexual Activity   Alcohol use: Not Currently    Alcohol/week: 1.0 standard drink of alcohol    Types: 1  Glasses of wine per week    Comment: Socially, Less than 10 drinks a year.   Drug use: Never   Sexual activity: Not Currently    Partners: Male    Birth control/protection: None    Comment: Hysterectomy  Other Topics Concern   Not on file  Social History Narrative   Single   Social Drivers of Health   Financial Resource Strain: Low Risk  (11/28/2023)   Overall Financial Resource Strain (CARDIA)    Difficulty of Paying Living Expenses: Not hard at all  Food Insecurity: No Food Insecurity (11/28/2023)   Hunger Vital Sign    Worried About Running Out of Food in the Last Year: Never true    Ran Out of Food in the Last Year: Never true  Transportation Needs: No Transportation Needs (11/28/2023)   PRAPARE - Administrator, Civil Service (Medical): No    Lack of Transportation (Non-Medical): No  Physical Activity: Sufficiently Active (11/28/2023)   Exercise Vital Sign    Days of Exercise per Week: 5 days    Minutes of Exercise per Session: 30 min  Recent Concern: Physical Activity - Insufficiently Active (11/28/2023)   Exercise Vital Sign    Days of Exercise per Week: 2 days    Minutes of Exercise per Session: 30 min  Stress: No Stress Concern Present (11/28/2023)   [employer] of Occupational Health - Occupational Stress Questionnaire    Feeling of Stress : Not at all  Social Connections: Moderately Isolated (11/28/2023)   Social Connection and Isolation Panel    Frequency of Communication with Friends and Family: More than three times a week    Frequency of Social Gatherings with Friends and Family: More than three times a week    Attends Religious Services: Never    Database Administrator or Organizations: Yes    Attends Engineer, Structural: More than 4 times per year    Marital Status: Never married   Allergies  Allergen Reactions   Baclofen Other (See Comments)    insomnia   Barley Grass Cough   Buckwheat Cough   Cyclobenzaprine Other (See Comments)     Insomnia   Escitalopram Oxalate Other (See Comments)    Unknown reaction    Iodine Nausea And Vomiting   Lyrica  [Pregabalin ]     Couldn't focus   Other     Rye - cough Beer - sinus congestion  E953 isomalt - cough   Rosuvastatin Other (See Comments)    myalgia   Sertraline Other (See Comments)    Makes depression worse   Simvastatin Other (See Comments)    myalgia   Vanilla Cough   Atorvastatin Other (See Comments)    myalgia   Codeine Rash  Contrast Media [Iodinated Contrast Media] Rash    Chest pressure    Hydrochlorothiazide-Triamterene Rash   Hydrocodone-Acetaminophen  Rash   Maxzide [Triamterene-Hctz] Rash   Tape Itching, Rash and Swelling   Family History  Problem Relation Age of Onset   Asthma Mother    Hypertension Mother    Colon polyps Mother    Gallstones Mother    Cancer Mother    Hearing loss Mother    Kidney disease Mother    Vision loss Mother    Allergic rhinitis Father    Stroke Father    Hypertension Brother    Gallstones Brother    Vision loss Brother    Hypertension Brother    Gallstones Brother    Vision loss Brother    Hypertension Brother    Gallstones Brother    Diabetes Brother      Current Outpatient Medications (Cardiovascular):    Evolocumab  (REPATHA  SURECLICK) 140 MG/ML SOAJ, Inject 140 mg into the skin every 14 (fourteen) days.   icosapent  Ethyl (VASCEPA ) 1 g capsule, Take 2 capsules (2 g total) by mouth 2 (two) times daily.   losartan  (COZAAR ) 25 MG tablet, Take 1 tablet (25 mg total) by mouth daily.   metoprolol  succinate (TOPROL -XL) 25 MG 24 hr tablet, TAKE 1 TABLET(25 MG) BY MOUTH DAILY  Current Outpatient Medications (Respiratory):    azelastine  (ASTELIN ) 0.1 % nasal spray, Place 1 spray into both nostrils 2 (two) times daily. Use in each nostril as directed   fluticasone  (FLONASE  SENSIMIST) 27.5 MCG/SPRAY nasal spray, Place 2 sprays into the nose daily as needed for rhinitis or allergies.  Current Outpatient Medications  (Analgesics):    aspirin  EC 81 MG tablet, Take 1 tablet (81 mg total) by mouth daily. Swallow whole.   Current Outpatient Medications (Other):    carisoprodol  (SOMA ) 350 MG tablet, Take 1 tablet (350 mg total) by mouth 3 (three) times daily as needed for muscle spasms.   cholecalciferol (VITAMIN D3) 25 MCG (1000 UNIT) tablet, Take 1,000 Units by mouth 2 (two) times daily.   melatonin 3 MG TABS tablet, Take 3 mg by mouth at bedtime.   Multiple Vitamin (MULTIVITAMIN WITH MINERALS) TABS tablet, Take 1 tablet by mouth daily.   Omega-3 Fatty Acids (OMEGA 3 PO), Take 1 capsule by mouth daily.   omeprazole  (PRILOSEC) 20 MG capsule, Take 1 capsule (20 mg total) by mouth daily.   OVER THE COUNTER MEDICATION, Take 1 tablet by mouth 2 (two) times daily. Vivscal [facility]   pregabalin  (LYRICA ) 25 MG capsule, Take 1 capsule (25 mg total) by mouth 2 (two) times daily as needed (Lumbar radiculopathy). (Patient not taking: Reported on 03/10/2024)   triamcinolone  cream (KENALOG ) 0.1 %, Apply 1 Application topically 2 (two) times daily as needed.   Reviewed prior external information including notes and imaging from  primary care provider As well as notes that were available from care everywhere and other healthcare systems.  Past medical history, social, surgical and family history all reviewed in electronic medical record.  No pertanent information unless stated regarding to the chief complaint.   Review of Systems:  No headache, visual changes, nausea, vomiting, diarrhea, constipation, dizziness, abdominal pain, skin rash, fevers, chills, night sweats, weight loss, swollen lymph nodes, body aches, joint swelling, chest pain, shortness of breath, mood changes. POSITIVE muscle aches  Objective  Pulse 79, height 4' 11 (1.499 m), weight 140 lb (63.5 kg), SpO2 95%.   General: No apparent distress alert and oriented x3 mood  and affect normal, dressed appropriately.  HEENT: Pupils equal,  extraocular movements intact  Respiratory: Patient's speak in full sentences and does not appear short of breath  Cardiovascular: No lower extremity edema, non tender, no erythema   Right hip exam shows that patient does have still some tenderness in the greater trochanteric area in the gluteal area.  Negative straight leg test noted.  Patient's back though does have some loss of lordosis noted.  Sitting relatively comfortably.   Impression and Recommendations:

## 2024-06-26 ENCOUNTER — Ambulatory Visit (INDEPENDENT_AMBULATORY_CARE_PROVIDER_SITE_OTHER): Admitting: Family Medicine

## 2024-06-26 VITALS — HR 79 | Ht 59.0 in | Wt 140.0 lb

## 2024-06-26 DIAGNOSIS — M7601 Gluteal tendinitis, right hip: Secondary | ICD-10-CM | POA: Diagnosis not present

## 2024-06-26 NOTE — Patient Instructions (Signed)
 Wonderful to see you I do think PRP would be helpful. Read about Cymbalta Write us  in MyChart when you think it would be a good time for the PRP if we decide to do that. Happy holidays!

## 2024-06-26 NOTE — Assessment & Plan Note (Signed)
 Discussed with patient at great length.  We discussed the gluteal tendon and may be the weakness secondary to either the injury versus the possibility of nerve injury from patient's previous back.  Does have a history of L4-L5 interbody fusion.  We discussed with patient about either epidurals versus Cymbalta versus PRP.  Patient would like to consider all of these and will get back to us .  Continue conservative therapy with the home exercises, icing regimen, I personally spent a total of 33 minutes in the care of the patient today including preparing to see the patient, getting/reviewing separately obtained history, performing a medically appropriate exam/evaluation, counseling and educating, placing orders, documenting clinical information in the EHR, and communicating results.

## 2024-07-23 ENCOUNTER — Encounter: Payer: Self-pay | Admitting: Hematology and Oncology

## 2024-07-23 ENCOUNTER — Inpatient Hospital Stay: Attending: Hematology and Oncology | Admitting: Hematology and Oncology

## 2024-07-23 VITALS — BP 115/69 | HR 86 | Temp 97.4°F | Resp 18 | Ht 59.0 in | Wt 142.1 lb

## 2024-07-23 DIAGNOSIS — Z9013 Acquired absence of bilateral breasts and nipples: Secondary | ICD-10-CM | POA: Diagnosis not present

## 2024-07-23 DIAGNOSIS — Z1501 Genetic susceptibility to malignant neoplasm of breast: Secondary | ICD-10-CM | POA: Insufficient documentation

## 2024-07-23 DIAGNOSIS — Z1505 Genetic susceptibility to malignant neoplasm of fallopian tube(s): Secondary | ICD-10-CM | POA: Insufficient documentation

## 2024-07-23 DIAGNOSIS — Z1502 Genetic susceptibility to malignant neoplasm of ovary: Secondary | ICD-10-CM | POA: Diagnosis not present

## 2024-07-23 DIAGNOSIS — Z853 Personal history of malignant neoplasm of breast: Secondary | ICD-10-CM | POA: Diagnosis present

## 2024-07-23 NOTE — Progress Notes (Signed)
 [facility] CONSULT NOTE  Patient Care Team: Geofm Glade PARAS, MD as PCP - General (Internal Medicine) Roy Anes, MD as Referring Physician (Orthopedic Surgery) Stechschulte, Deward PARAS, MD as Consulting Physician (Surgery) Vannie Reche RAMAN, NP as Nurse Practitioner (Cardiology)  CHIEF COMPLAINTS/PURPOSE OF CONSULTATION:  History of breast cancer  ASSESSMENT & PLAN:   This is a patient with a past medical history of right breast invasive ductal carcinoma, stage T2 N0 M0 with 2 specimens, first mass was triple negative with Ki-67 index of 54%, second mass was 2% ER positive, PR and HER2 negative with a Ki-67 of 34%.  She completed adjuvant chemotherapy with TAC, intolerant to Femara, Arimidex and Aromasin, declined  tamoxifen.  She is BRCA1 mutation carrier and has had contralateral mastectomy and bilateral salpingo-oophorectomy.  She has been cancer free for the past 14 years.   She is doing well today. No major concerns except ongoing back issues. She has noticed some discomfort in the sternum right between her scars. No palpable lumps. No concerns on exam.  I recommended she continue to monitor this symptom and keep us  posted if this continues to bother her Otherwise she can come back for FU in 1 yr with me.  Amber Stalls MD   HISTORY OF PRESENTING ILLNESS:  Shannon Osborne 68 y.o. female is here because of high risk for breast cncer.  This is a very pleasant 68 year old postmenopausal female patient with past medical history significant for right-sided breast cancer stage II, T2 N0 M0 invasive ductal carcinoma status post right mastectomy and sentinel lymph node dissection.  2 specimens present 1 was grade 3, ER negative PR negative and HER2 negative with a high Ki-67 index of 54%, the second mass was grade 3 2% ER positive, PR negative, HER2 negative with Ki-67 at 34%.  She had chemotherapy with TAC x6 completed February 06, 2008.  She was intolerant to Femara and Arimidex and  subsequently Aromasin.  She declined a trial of tamoxifen.  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  She was previously treated by Dr. Seldon in [facility], moved to the [facility] and is establishing with us  given 1 mutation. Her dad had 4 sisters with breast cancer.  There appears to be history of female breast cancer on her maternal side.  Interval History  Shannon Osborne is here for follow-up.   Since her last visit here, she has been doing quite well.  She continues to have back issues and has noticed some numbness and burning pain in the right leg. She continues to work on exercises and this has helped some with back pain. This pain noticeable gets worse with some movement especially when she moves backwards. No change in breathing. No change in bowel habits. No change in urinary habits. Rest of the pertinent 10 point ROS reviewed and neg.  MEDICAL HISTORY:  Past Medical History:  Diagnosis Date   Allergy In my teens   Anemia    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   Breast cancer (HCC) 2009   BRACA 1 Pos   Cataract 2000   Chronic midline low back pain without sciatica 02/18/2020   Coronary artery disease    mild   COVID-19 10/20/2022   Family history of adverse reaction to anesthesia    mother is slow to wake up   Fibromyalgia    GERD (gastroesophageal reflux disease) 02/18/2020   Hyperlipemia  Hypertension    Mitral valve prolapse    never has caused any problems per patient 12/04/22   Multiple drug allergies 02/18/2020   Myalgia due to statin 02/18/2020   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   MRI 05/2018   Osteopenia after menopause 02/11/2021   DEXA 01/2021 lowest T = -1.7 femur; recheck 2-3 years   Pneumonia    x 1 - years ago   PONV (postoperative nausea and vomiting)    after most recent colonoscopy (2020)    SURGICAL HISTORY: Past Surgical  History:  Procedure Laterality Date   ABDOMINAL HYSTERECTOMY  2002   BREAST SURGERY     right 09/10/2007, left 06/16/2009   colonosocpy     several   FOOT BONE EXCISION Right 2021   at [facility]  - fusion heel bone   JOINT REPLACEMENT     Left shoulder   SHOULDER ARTHROTOMY Left 2013   and 06/2015   SPINE SURGERY  12/11/2022   TONSILLECTOMY  2012   TRANSFORAMINAL LUMBAR INTERBODY FUSION (TLIF) WITH PEDICLE SCREW FIXATION 1 LEVEL N/A 12/11/2022   Procedure: Lumbar five-Sacral one Laminectomy, Transforaminal Lumbar Interbody Fusion, posterolateral instrumented fusion;  Surgeon: Cheryle Debby LABOR, MD;  Location: [facility] OR;  Service: Neurosurgery;  Laterality: N/A;   UPPER GI ENDOSCOPY      SOCIAL HISTORY: Social History   Socioeconomic History   Marital status: Single    Spouse name: Not on file   Number of children: Not on file   Years of education: Not on file   Highest education level: Bachelor's degree (e.g., BA, AB, BS)  Occupational History   Not on file  Tobacco Use   Smoking status: Never    Passive exposure: Never   Smokeless tobacco: Never   Tobacco comments:    Never used  Vaping Use   Vaping status: Never Used  Substance and Sexual Activity   Alcohol use: Not Currently    Alcohol/week: 1.0 standard drink of alcohol    Types: 1 Glasses of wine per week    Comment: Socially, Less than 10 drinks a year.   Drug use: Never   Sexual activity: Not Currently    Partners: Male    Birth control/protection: None    Comment: Hysterectomy  Other Topics Concern   Not on file  Social History Narrative   Single   Social Drivers of Health   Financial Resource Strain: Low Risk  (11/28/2023)   Overall Financial Resource Strain (CARDIA)    Difficulty of Paying Living Expenses: Not hard at all  Food Insecurity: No Food Insecurity (11/28/2023)   Hunger Vital Sign    Worried About Running Out of Food in the Last Year: Never true    Ran Out of Food in the Last Year: Never true   Transportation Needs: No Transportation Needs (11/28/2023)   PRAPARE - Administrator, Civil Service (Medical): No    Lack of Transportation (Non-Medical): No  Physical Activity: Sufficiently Active (11/28/2023)   Exercise Vital Sign    Days of Exercise per Week: 5 days    Minutes of Exercise per Session: 30 min  Recent Concern: Physical Activity - Insufficiently Active (11/28/2023)   Exercise Vital Sign    Days of Exercise per Week: 2 days    Minutes of Exercise per Session: 30 min  Stress: No Stress Concern Present (11/28/2023)   [employer] of Occupational Health - Occupational Stress Questionnaire    Feeling of Stress : Not at all  Social Connections: Moderately Isolated (11/28/2023)   Social Connection and Isolation Panel    Frequency of Communication with Friends and Family: More than three times a week    Frequency of Social Gatherings with Friends and Family: More than three times a week    Attends Religious Services: Never    Database Administrator or Organizations: Yes    Attends Engineer, Structural: More than 4 times per year    Marital Status: Never married  Intimate Partner Violence: Not At Risk (11/28/2023)   Humiliation, Afraid, Rape, and Kick questionnaire    Fear of Current or Ex-Partner: No    Emotionally Abused: No    Physically Abused: No    Sexually Abused: No    FAMILY HISTORY: Family History  Problem Relation Age of Onset   Asthma Mother    Hypertension Mother    Colon polyps Mother    Gallstones Mother    Cancer Mother    Hearing loss Mother    Kidney disease Mother    Vision loss Mother    Allergic rhinitis Father    Stroke Father    Hypertension Brother    Gallstones Brother    Vision loss Brother    Hypertension Brother    Gallstones Brother    Vision loss Brother    Hypertension Brother    Gallstones Brother    Diabetes Brother     ALLERGIES:  is allergic to baclofen, barley grass, buckwheat, cyclobenzaprine,  escitalopram oxalate, iodine, lyrica  [pregabalin ], other, rosuvastatin, sertraline, simvastatin, vanilla, atorvastatin, codeine, contrast media [iodinated contrast media], hydrochlorothiazide-triamterene, hydrocodone-acetaminophen , maxzide [triamterene-hctz], and tape.  MEDICATIONS:  Current Outpatient Medications  Medication Sig Dispense Refill   aspirin  EC 81 MG tablet Take 1 tablet (81 mg total) by mouth daily. Swallow whole. 30 tablet 11   azelastine  (ASTELIN ) 0.1 % nasal spray Place 1 spray into both nostrils 2 (two) times daily. Use in each nostril as directed     carisoprodol  (SOMA ) 350 MG tablet Take 1 tablet (350 mg total) by mouth 3 (three) times daily as needed for muscle spasms. 90 tablet 0   cholecalciferol (VITAMIN D3) 25 MCG (1000 UNIT) tablet Take 1,000 Units by mouth 2 (two) times daily.     Evolocumab  (REPATHA  SURECLICK) 140 MG/ML SOAJ Inject 140 mg into the skin every 14 (fourteen) days. 6 mL 3   fluticasone  (FLONASE  SENSIMIST) 27.5 MCG/SPRAY nasal spray Place 2 sprays into the nose daily as needed for rhinitis or allergies.     icosapent  Ethyl (VASCEPA ) 1 g capsule Take 2 capsules (2 g total) by mouth 2 (two) times daily. 360 capsule 3   losartan  (COZAAR ) 25 MG tablet Take 1 tablet (25 mg total) by mouth daily. 90 tablet 3   melatonin 3 MG TABS tablet Take 3 mg by mouth at bedtime.     metoprolol  succinate (TOPROL -XL) 25 MG 24 hr tablet TAKE 1 TABLET(25 MG) BY MOUTH DAILY 90 tablet 1   Multiple Vitamin (MULTIVITAMIN WITH MINERALS) TABS tablet Take 1 tablet by mouth daily.     Omega-3 Fatty Acids (OMEGA 3 PO) Take 1 capsule by mouth daily.     omeprazole  (PRILOSEC) 20 MG capsule Take 1 capsule (20 mg total) by mouth daily.     OVER THE COUNTER MEDICATION Take 1 tablet by mouth 2 (two) times daily. Vivscal [employer]     pregabalin  (LYRICA ) 25 MG capsule Take 1 capsule (25 mg total) by mouth 2 (two) times daily as needed (  Lumbar radiculopathy). (Patient not taking:  Reported on 03/10/2024)     triamcinolone  cream (KENALOG ) 0.1 % Apply 1 Application topically 2 (two) times daily as needed. 454 g 0   No current facility-administered medications for this visit.     PHYSICAL EXAMINATION: ECOG PERFORMANCE STATUS: 0 - Asymptomatic  Vitals:   07/23/24 1257  BP: 115/69  Pulse: 86  Resp: 18  Temp: (!) 97.4 F (36.3 C)  SpO2: 100%     Filed Weights   07/23/24 1257  Weight: 142 lb 1.6 oz (64.5 kg)    GENERAL:alert, no distress and comfortable Neck: No cervical adenopathy Breast:  Status post bilateral mastectomy.  No concern for recurrence.  No palpable masses or regional adenopathy.  Trace bilateral lower extremity swelling.  LABORATORY DATA:  I have reviewed the data as listed Lab Results  Component Value Date   WBC 5.5 09/10/2023   HGB 12.4 09/10/2023   HCT 37.4 09/10/2023   MCV 90.3 09/10/2023   PLT 279.0 09/10/2023     Chemistry      Component Value Date/Time   NA 139 03/10/2024 0913   NA 142 09/20/2022 0800   K 4.6 03/10/2024 0913   CL 103 03/10/2024 0913   CO2 28 03/10/2024 0913   BUN 18 03/10/2024 0913   BUN 17 09/20/2022 0800   CREATININE 0.59 03/10/2024 0913   GLU 89 07/07/2019 0000      Component Value Date/Time   CALCIUM 9.6 03/10/2024 0913   ALKPHOS 62 03/10/2024 0913   AST 17 03/10/2024 0913   ALT 14 03/10/2024 0913   BILITOT 0.6 03/10/2024 0913   BILITOT 0.4 12/05/2022 0000       RADIOGRAPHIC STUDIES: I have personally reviewed the radiological images as listed and agreed with the findings in the report.  All questions were answered. The patient knows to call the clinic with any problems, questions or concerns.  I spent in the care of this patient including H and P, review of records, counseling and coordination of care.     Amber Stalls, MD 07/23/2024 1:24 PM

## 2024-07-24 ENCOUNTER — Other Ambulatory Visit: Payer: Self-pay | Admitting: Internal Medicine

## 2024-08-26 NOTE — Progress Notes (Unsigned)
" °  [facility] [address] [place] [address] Phone: [phone] Subjective:    I'm seeing this patient by the request  of:  Geofm Glade PARAS, MD  CC: Right gluteal pain.  YEP:Dlagzrupcz  06/26/2024 Discussed with patient at great length.  We discussed the gluteal tendon and may be the weakness secondary to either the injury versus the possibility of nerve injury from patient's previous back.  Does have a history of L4-L5 interbody fusion.  We discussed with patient about either epidurals versus Cymbalta versus PRP.  Patient would like to consider all of these and will get back to us .  Continue conservative therapy with the home exercises, icing regimen, I personally spent a total of 33 minutes in the care of the patient today including preparing to see the patient, getting/reviewing separately obtained history, performing a medically appropriate exam/evaluation, counseling and educating, placing orders, documenting clinical information in the EHR, and communicating results.      Update 08/27/2024 Shannon Osborne is a 69 y.o. female coming in with complaint of R glute pain. Here for PRP. Patient states that she has 2/10 pain in the morning. Pain is constant. Using ice and doing HEP. Painful to abduct.     Objective  There were no vitals taken for this visit.   General: No apparent distress alert and oriented x3 mood and affect normal, dressed appropriately.   Procedure: Real-time Ultrasound Guided Injection of gluteal minimus tendon on the right Device: GE Logiq Q7 Ultrasound guided injection is preferred based studies that show increased duration, increased effect, greater accuracy, decreased procedural pain, increased response rate, and decreased cost with ultrasound guided versus blind injection.  Verbal informed consent obtained.  Time-out conducted.  Noted no overlying erythema, induration, or other signs of local infection.  Skin prepped in a sterile  fashion.  Local anesthesia: Topical Ethyl chloride.  With sterile technique and under real time ultrasound guidance: With a 21-gauge 2 inch needle injected with 0.5 cc of 0.5% Marcaine  and placement noted into the gluteal tendon.  Total length 4 cc of PRP leukocyte rich injected. Completed without difficulty  Pain immediately resolved suggesting accurate placement of the medication.  Advised to call if fevers/chills, erythema, induration, drainage, or persistent bleeding.  Images saved Impression: Technically successful ultrasound guided injection.   Impression and Recommendations:    The above documentation has been reviewed and is accurate and complete [name] M Alassane Kalafut, DO     "

## 2024-08-27 ENCOUNTER — Other Ambulatory Visit: Payer: Self-pay

## 2024-08-27 ENCOUNTER — Ambulatory Visit (INDEPENDENT_AMBULATORY_CARE_PROVIDER_SITE_OTHER): Payer: Self-pay | Admitting: Family Medicine

## 2024-08-27 ENCOUNTER — Encounter: Payer: Self-pay | Admitting: Family Medicine

## 2024-08-27 VITALS — BP 120/82 | HR 90 | Ht 59.0 in | Wt 142.0 lb

## 2024-08-27 DIAGNOSIS — M7601 Gluteal tendinitis, right hip: Secondary | ICD-10-CM

## 2024-08-27 DIAGNOSIS — M25551 Pain in right hip: Secondary | ICD-10-CM

## 2024-08-27 NOTE — Assessment & Plan Note (Signed)
PRP given today, post PRP instructions given.  Follow-up again in 6 to 8 weeks

## 2024-08-27 NOTE — Patient Instructions (Signed)
No ice or IBU for 3 days Heat and Tylenol are ok See me again in 6-8 weeks 

## 2024-08-28 ENCOUNTER — Encounter: Payer: Self-pay | Admitting: Family Medicine

## 2024-09-04 ENCOUNTER — Telehealth: Payer: Self-pay | Admitting: Family

## 2024-09-04 NOTE — Telephone Encounter (Signed)
 Pt states Repatha  needs prior auth so she can continue to receive medication, please advise.

## 2024-09-18 ENCOUNTER — Other Ambulatory Visit (HOSPITAL_BASED_OUTPATIENT_CLINIC_OR_DEPARTMENT_OTHER): Payer: Self-pay | Admitting: Cardiology

## 2024-09-19 ENCOUNTER — Encounter (HOSPITAL_BASED_OUTPATIENT_CLINIC_OR_DEPARTMENT_OTHER): Payer: Self-pay

## 2024-09-19 NOTE — Telephone Encounter (Signed)
 As previous encounter routed to incorrect prior auth team, separate staff message sent to leadership to ensure not a recurrent issue.   Kymiah Araiza S Joab Carden, NP

## 2024-09-22 ENCOUNTER — Telehealth: Payer: Self-pay | Admitting: Pharmacy Technician

## 2024-09-22 ENCOUNTER — Other Ambulatory Visit (HOSPITAL_COMMUNITY): Payer: Self-pay

## 2024-09-22 NOTE — Telephone Encounter (Signed)
" ° °  Prior authorization still good until 10/17/24   Pharmacy Patient Advocate Encounter   Received notification from Pt Calls Messages that prior authorization for repatha  is required/requested.   Insurance verification completed.   The patient is insured through [facility].   Per test claim: PA required; PA submitted to above mentioned insurance via Latent Key/confirmation #/EOC [id number] Status is pending   Pharmacy Patient Advocate Encounter  Received notification from [facility] that Prior Authorization for repatha  has been APPROVED from 09/22/24 to 09/22/25. Spoke to pharmacy to process.Copay is $104.97 FOR 3 MONTHS.    PA #/Case ID/Reference #: [id number]    [facility] ordering for tomorrow  "

## 2024-09-26 ENCOUNTER — Ambulatory Visit: Admitting: Family Medicine

## 2024-10-08 ENCOUNTER — Ambulatory Visit: Admitting: Family Medicine

## 2024-11-18 ENCOUNTER — Ambulatory Visit (HOSPITAL_BASED_OUTPATIENT_CLINIC_OR_DEPARTMENT_OTHER): Admitting: Cardiology

## 2024-12-03 ENCOUNTER — Ambulatory Visit

## 2025-07-24 ENCOUNTER — Inpatient Hospital Stay: Admitting: Hematology and Oncology
